# Patient Record
Sex: Male | Born: 1945
Health system: Southern US, Community
[De-identification: ages and names within clinical notes are randomized; demographics above are authoritative.]

## PROBLEM LIST (undated history)

## (undated) DIAGNOSIS — C159 Malignant neoplasm of esophagus, unspecified: Secondary | ICD-10-CM

## (undated) DIAGNOSIS — K219 Gastro-esophageal reflux disease without esophagitis: Secondary | ICD-10-CM

## (undated) DIAGNOSIS — R490 Dysphonia: Secondary | ICD-10-CM

## (undated) DIAGNOSIS — R109 Unspecified abdominal pain: Secondary | ICD-10-CM

## (undated) DIAGNOSIS — Z974 Presence of external hearing-aid: Secondary | ICD-10-CM

## (undated) DIAGNOSIS — I499 Cardiac arrhythmia, unspecified: Secondary | ICD-10-CM

## (undated) HISTORY — PX: PROSTATE BIOPSY: SHX241

## (undated) HISTORY — DX: Malignant neoplasm of esophagus, unspecified: C15.9

---

## 2009-02-03 ENCOUNTER — Emergency Department (HOSPITAL_COMMUNITY): Admission: EM | Admit: 2009-02-03 | Discharge: 2009-02-03 | Payer: Self-pay | Admitting: Emergency Medicine

## 2010-06-11 ENCOUNTER — Ambulatory Visit: Admission: RE | Admit: 2010-06-11 | Discharge: 2010-06-19 | Payer: Self-pay | Admitting: Urology

## 2010-06-18 ENCOUNTER — Ambulatory Visit (HOSPITAL_COMMUNITY): Admission: RE | Admit: 2010-06-18 | Discharge: 2010-06-18 | Payer: Self-pay | Admitting: Gastroenterology

## 2010-06-23 ENCOUNTER — Ambulatory Visit: Payer: Self-pay | Admitting: Oncology

## 2010-06-24 ENCOUNTER — Ambulatory Visit (HOSPITAL_COMMUNITY): Admission: RE | Admit: 2010-06-24 | Discharge: 2010-06-24 | Payer: Self-pay | Admitting: Gastroenterology

## 2010-06-30 ENCOUNTER — Ambulatory Visit: Admission: RE | Admit: 2010-06-30 | Discharge: 2010-08-18 | Payer: Self-pay | Admitting: Radiation Oncology

## 2010-07-08 LAB — CBC WITH DIFFERENTIAL/PLATELET
BASO%: 0.3 % (ref 0.0–2.0)
Basophils Absolute: 0 10*3/uL (ref 0.0–0.1)
EOS%: 0.2 % (ref 0.0–7.0)
Eosinophils Absolute: 0 10*3/uL (ref 0.0–0.5)
HCT: 46.3 % (ref 38.4–49.9)
HGB: 16 g/dL (ref 13.0–17.1)
LYMPH%: 7 % — ABNORMAL LOW (ref 14.0–49.0)
MCH: 31.9 pg (ref 27.2–33.4)
MCHC: 34.6 g/dL (ref 32.0–36.0)
MCV: 92.4 fL (ref 79.3–98.0)
MONO#: 0.2 10*3/uL (ref 0.1–0.9)
MONO%: 2.4 % (ref 0.0–14.0)
NEUT#: 7.7 10*3/uL — ABNORMAL HIGH (ref 1.5–6.5)
NEUT%: 90.1 % — ABNORMAL HIGH (ref 39.0–75.0)
Platelets: 266 10*3/uL (ref 140–400)
RBC: 5.01 10*6/uL (ref 4.20–5.82)
RDW: 13.8 % (ref 11.0–14.6)
WBC: 8.6 10*3/uL (ref 4.0–10.3)
lymph#: 0.6 10*3/uL — ABNORMAL LOW (ref 0.9–3.3)

## 2010-07-08 LAB — COMPREHENSIVE METABOLIC PANEL
ALT: 33 U/L (ref 0–53)
AST: 26 U/L (ref 0–37)
Albumin: 4 g/dL (ref 3.5–5.2)
Alkaline Phosphatase: 57 U/L (ref 39–117)
BUN: 13 mg/dL (ref 6–23)
CO2: 32 mEq/L (ref 19–32)
Calcium: 9.4 mg/dL (ref 8.4–10.5)
Chloride: 104 mEq/L (ref 96–112)
Creatinine, Ser: 1.37 mg/dL (ref 0.40–1.50)
Glucose, Bld: 96 mg/dL (ref 70–99)
Potassium: 4.3 mEq/L (ref 3.5–5.3)
Sodium: 140 mEq/L (ref 135–145)
Total Bilirubin: 0.9 mg/dL (ref 0.3–1.2)
Total Protein: 7 g/dL (ref 6.0–8.3)

## 2010-07-14 ENCOUNTER — Ambulatory Visit: Payer: Self-pay | Admitting: Thoracic Surgery

## 2010-07-15 LAB — CBC WITH DIFFERENTIAL/PLATELET
BASO%: 0.6 % (ref 0.0–2.0)
Basophils Absolute: 0 10*3/uL (ref 0.0–0.1)
EOS%: 3.3 % (ref 0.0–7.0)
Eosinophils Absolute: 0.2 10*3/uL (ref 0.0–0.5)
HCT: 44 % (ref 38.4–49.9)
HGB: 15.3 g/dL (ref 13.0–17.1)
LYMPH%: 17.7 % (ref 14.0–49.0)
MCH: 31.7 pg (ref 27.2–33.4)
MCHC: 34.8 g/dL (ref 32.0–36.0)
MCV: 91.3 fL (ref 79.3–98.0)
MONO#: 0.5 10*3/uL (ref 0.1–0.9)
MONO%: 9.5 % (ref 0.0–14.0)
NEUT#: 3.6 10*3/uL (ref 1.5–6.5)
NEUT%: 68.9 % (ref 39.0–75.0)
Platelets: 248 10*3/uL (ref 140–400)
RBC: 4.82 10*6/uL (ref 4.20–5.82)
RDW: 13.4 % (ref 11.0–14.6)
WBC: 5.2 10*3/uL (ref 4.0–10.3)
lymph#: 0.9 10*3/uL (ref 0.9–3.3)
nRBC: 0 % (ref 0–0)

## 2010-07-22 LAB — CBC WITH DIFFERENTIAL/PLATELET
BASO%: 0.8 % (ref 0.0–2.0)
Basophils Absolute: 0 10*3/uL (ref 0.0–0.1)
EOS%: 4.8 % (ref 0.0–7.0)
Eosinophils Absolute: 0.2 10*3/uL (ref 0.0–0.5)
HCT: 42.8 % (ref 38.4–49.9)
HGB: 14.8 g/dL (ref 13.0–17.1)
LYMPH%: 17.6 % (ref 14.0–49.0)
MCH: 31.6 pg (ref 27.2–33.4)
MCHC: 34.6 g/dL (ref 32.0–36.0)
MCV: 91.5 fL (ref 79.3–98.0)
MONO#: 0.4 10*3/uL (ref 0.1–0.9)
MONO%: 9.6 % (ref 0.0–14.0)
NEUT#: 2.5 10*3/uL (ref 1.5–6.5)
NEUT%: 67.2 % (ref 39.0–75.0)
Platelets: 194 10*3/uL (ref 140–400)
RBC: 4.68 10*6/uL (ref 4.20–5.82)
RDW: 13.9 % (ref 11.0–14.6)
WBC: 3.8 10*3/uL — ABNORMAL LOW (ref 4.0–10.3)
lymph#: 0.7 10*3/uL — ABNORMAL LOW (ref 0.9–3.3)

## 2010-07-22 LAB — COMPREHENSIVE METABOLIC PANEL
ALT: 23 U/L (ref 0–53)
AST: 25 U/L (ref 0–37)
Albumin: 3.8 g/dL (ref 3.5–5.2)
Alkaline Phosphatase: 51 U/L (ref 39–117)
BUN: 14 mg/dL (ref 6–23)
CO2: 30 mEq/L (ref 19–32)
Calcium: 9.5 mg/dL (ref 8.4–10.5)
Chloride: 103 mEq/L (ref 96–112)
Creatinine, Ser: 0.73 mg/dL (ref 0.40–1.50)
Glucose, Bld: 91 mg/dL (ref 70–99)
Potassium: 4.1 mEq/L (ref 3.5–5.3)
Sodium: 141 mEq/L (ref 135–145)
Total Bilirubin: 0.9 mg/dL (ref 0.3–1.2)
Total Protein: 6.5 g/dL (ref 6.0–8.3)

## 2010-07-25 ENCOUNTER — Ambulatory Visit: Payer: Self-pay | Admitting: Oncology

## 2010-07-29 LAB — CBC WITH DIFFERENTIAL/PLATELET
BASO%: 1.5 % (ref 0.0–2.0)
Basophils Absolute: 0.1 10*3/uL (ref 0.0–0.1)
EOS%: 4 % (ref 0.0–7.0)
Eosinophils Absolute: 0.2 10*3/uL (ref 0.0–0.5)
HCT: 42.3 % (ref 38.4–49.9)
HGB: 14.8 g/dL (ref 13.0–17.1)
LYMPH%: 11.5 % — ABNORMAL LOW (ref 14.0–49.0)
MCH: 31.9 pg (ref 27.2–33.4)
MCHC: 35 g/dL (ref 32.0–36.0)
MCV: 91.2 fL (ref 79.3–98.0)
MONO#: 0.5 10*3/uL (ref 0.1–0.9)
MONO%: 11.5 % (ref 0.0–14.0)
NEUT#: 2.9 10*3/uL (ref 1.5–6.5)
NEUT%: 71.5 % (ref 39.0–75.0)
Platelets: 151 10*3/uL (ref 140–400)
RBC: 4.64 10*6/uL (ref 4.20–5.82)
RDW: 14.3 % (ref 11.0–14.6)
WBC: 4 10*3/uL (ref 4.0–10.3)
lymph#: 0.5 10*3/uL — ABNORMAL LOW (ref 0.9–3.3)
nRBC: 0 % (ref 0–0)

## 2010-08-05 LAB — CBC WITH DIFFERENTIAL/PLATELET
BASO%: 0.7 % (ref 0.0–2.0)
Basophils Absolute: 0 10*3/uL (ref 0.0–0.1)
EOS%: 2.9 % (ref 0.0–7.0)
Eosinophils Absolute: 0.1 10*3/uL (ref 0.0–0.5)
HCT: 40.5 % (ref 38.4–49.9)
HGB: 13.9 g/dL (ref 13.0–17.1)
LYMPH%: 9.3 % — ABNORMAL LOW (ref 14.0–49.0)
MCH: 31.6 pg (ref 27.2–33.4)
MCHC: 34.3 g/dL (ref 32.0–36.0)
MCV: 92 fL (ref 79.3–98.0)
MONO#: 0.5 10*3/uL (ref 0.1–0.9)
MONO%: 12 % (ref 0.0–14.0)
NEUT#: 3.1 10*3/uL (ref 1.5–6.5)
NEUT%: 75.1 % — ABNORMAL HIGH (ref 39.0–75.0)
Platelets: 144 10*3/uL (ref 140–400)
RBC: 4.4 10*6/uL (ref 4.20–5.82)
RDW: 14.8 % — ABNORMAL HIGH (ref 11.0–14.6)
WBC: 4.1 10*3/uL (ref 4.0–10.3)
lymph#: 0.4 10*3/uL — ABNORMAL LOW (ref 0.9–3.3)

## 2010-08-05 LAB — COMPREHENSIVE METABOLIC PANEL
ALT: 36 U/L (ref 0–53)
AST: 32 U/L (ref 0–37)
Albumin: 3.9 g/dL (ref 3.5–5.2)
Alkaline Phosphatase: 45 U/L (ref 39–117)
BUN: 18 mg/dL (ref 6–23)
CO2: 28 mEq/L (ref 19–32)
Calcium: 9.3 mg/dL (ref 8.4–10.5)
Chloride: 103 mEq/L (ref 96–112)
Creatinine, Ser: 0.9 mg/dL (ref 0.40–1.50)
Glucose, Bld: 85 mg/dL (ref 70–99)
Potassium: 4.1 mEq/L (ref 3.5–5.3)
Sodium: 140 mEq/L (ref 135–145)
Total Bilirubin: 0.5 mg/dL (ref 0.3–1.2)
Total Protein: 6.9 g/dL (ref 6.0–8.3)

## 2010-08-12 ENCOUNTER — Ambulatory Visit: Payer: Self-pay | Admitting: Thoracic Surgery

## 2010-08-12 LAB — COMPREHENSIVE METABOLIC PANEL
ALT: 26 U/L (ref 0–53)
AST: 24 U/L (ref 0–37)
Albumin: 4.1 g/dL (ref 3.5–5.2)
Alkaline Phosphatase: 52 U/L (ref 39–117)
BUN: 16 mg/dL (ref 6–23)
CO2: 28 mEq/L (ref 19–32)
Calcium: 9.4 mg/dL (ref 8.4–10.5)
Chloride: 103 mEq/L (ref 96–112)
Creatinine, Ser: 0.88 mg/dL (ref 0.40–1.50)
Glucose, Bld: 97 mg/dL (ref 70–99)
Potassium: 4.1 mEq/L (ref 3.5–5.3)
Sodium: 141 mEq/L (ref 135–145)
Total Bilirubin: 0.6 mg/dL (ref 0.3–1.2)
Total Protein: 6.1 g/dL (ref 6.0–8.3)

## 2010-08-12 LAB — CBC WITH DIFFERENTIAL/PLATELET
BASO%: 1 % (ref 0.0–2.0)
Basophils Absolute: 0 10*3/uL (ref 0.0–0.1)
EOS%: 3.8 % (ref 0.0–7.0)
Eosinophils Absolute: 0.1 10*3/uL (ref 0.0–0.5)
HCT: 39.8 % (ref 38.4–49.9)
HGB: 14 g/dL (ref 13.0–17.1)
LYMPH%: 7.6 % — ABNORMAL LOW (ref 14.0–49.0)
MCH: 31.9 pg (ref 27.2–33.4)
MCHC: 35.2 g/dL (ref 32.0–36.0)
MCV: 90.7 fL (ref 79.3–98.0)
MONO#: 0.4 10*3/uL (ref 0.1–0.9)
MONO%: 13.7 % (ref 0.0–14.0)
NEUT#: 2.3 10*3/uL (ref 1.5–6.5)
NEUT%: 73.9 % (ref 39.0–75.0)
Platelets: 143 10*3/uL (ref 140–400)
RBC: 4.39 10*6/uL (ref 4.20–5.82)
RDW: 15.4 % — ABNORMAL HIGH (ref 11.0–14.6)
WBC: 3.1 10*3/uL — ABNORMAL LOW (ref 4.0–10.3)
lymph#: 0.2 10*3/uL — ABNORMAL LOW (ref 0.9–3.3)
nRBC: 0 % (ref 0–0)

## 2010-08-19 LAB — COMPREHENSIVE METABOLIC PANEL
ALT: 27 U/L (ref 0–53)
AST: 28 U/L (ref 0–37)
Albumin: 3.4 g/dL — ABNORMAL LOW (ref 3.5–5.2)
Alkaline Phosphatase: 66 U/L (ref 39–117)
BUN: 15 mg/dL (ref 6–23)
CO2: 27 mEq/L (ref 19–32)
Calcium: 8.7 mg/dL (ref 8.4–10.5)
Chloride: 106 mEq/L (ref 96–112)
Creatinine, Ser: 0.9 mg/dL (ref 0.40–1.50)
Glucose, Bld: 116 mg/dL — ABNORMAL HIGH (ref 70–99)
Potassium: 4.5 mEq/L (ref 3.5–5.3)
Sodium: 139 mEq/L (ref 135–145)
Total Bilirubin: 0.5 mg/dL (ref 0.3–1.2)
Total Protein: 6.1 g/dL (ref 6.0–8.3)

## 2010-08-19 LAB — CBC WITH DIFFERENTIAL/PLATELET
BASO%: 1 % (ref 0.0–2.0)
Basophils Absolute: 0 10*3/uL (ref 0.0–0.1)
EOS%: 2.1 % (ref 0.0–7.0)
Eosinophils Absolute: 0.1 10*3/uL (ref 0.0–0.5)
HCT: 35.6 % — ABNORMAL LOW (ref 38.4–49.9)
HGB: 12.6 g/dL — ABNORMAL LOW (ref 13.0–17.1)
LYMPH%: 5.2 % — ABNORMAL LOW (ref 14.0–49.0)
MCH: 33.5 pg — ABNORMAL HIGH (ref 27.2–33.4)
MCHC: 35.5 g/dL (ref 32.0–36.0)
MCV: 94.4 fL (ref 79.3–98.0)
MONO#: 0.6 10*3/uL (ref 0.1–0.9)
MONO%: 17.4 % — ABNORMAL HIGH (ref 0.0–14.0)
NEUT#: 2.7 10*3/uL (ref 1.5–6.5)
NEUT%: 74.3 % (ref 39.0–75.0)
Platelets: 185 10*3/uL (ref 140–400)
RBC: 3.77 10*6/uL — ABNORMAL LOW (ref 4.20–5.82)
RDW: 16.8 % — ABNORMAL HIGH (ref 11.0–14.6)
WBC: 3.7 10*3/uL — ABNORMAL LOW (ref 4.0–10.3)
lymph#: 0.2 10*3/uL — ABNORMAL LOW (ref 0.9–3.3)

## 2010-09-03 ENCOUNTER — Ambulatory Visit (HOSPITAL_COMMUNITY)
Admission: RE | Admit: 2010-09-03 | Discharge: 2010-09-03 | Payer: Self-pay | Source: Home / Self Care | Attending: Thoracic Surgery | Admitting: Thoracic Surgery

## 2010-09-03 ENCOUNTER — Ambulatory Visit: Payer: Self-pay | Admitting: Thoracic Surgery

## 2010-09-10 ENCOUNTER — Ambulatory Visit: Payer: Self-pay | Admitting: Oncology

## 2010-09-28 ENCOUNTER — Inpatient Hospital Stay (HOSPITAL_COMMUNITY)
Admission: EM | Admit: 2010-09-28 | Discharge: 2010-10-19 | Payer: Self-pay | Source: Home / Self Care | Attending: Thoracic Surgery | Admitting: Thoracic Surgery

## 2010-09-29 ENCOUNTER — Encounter: Payer: Self-pay | Admitting: Thoracic Surgery

## 2010-09-29 HISTORY — PX: JEJUNOSTOMY: SHX313

## 2010-09-29 HISTORY — PX: PYLOROPLASTY: SHX418

## 2010-10-02 HISTORY — PX: ESOPHAGECTOMY: SUR457

## 2010-10-06 LAB — COMPREHENSIVE METABOLIC PANEL
ALT: 28 U/L (ref 0–53)
ALT: 32 U/L (ref 0–53)
ALT: 64 U/L — ABNORMAL HIGH (ref 0–53)
ALT: 139 U/L — ABNORMAL HIGH (ref 0–53)
ALT: 167 U/L — ABNORMAL HIGH (ref 0–53)
AST: 37 U/L (ref 0–37)
AST: 50 U/L — ABNORMAL HIGH (ref 0–37)
AST: 81 U/L — ABNORMAL HIGH (ref 0–37)
AST: 117 U/L — ABNORMAL HIGH (ref 0–37)
AST: 125 U/L — ABNORMAL HIGH (ref 0–37)
Albumin: 3.5 g/dL (ref 3.5–5.2)
Albumin: 2.9 g/dL — ABNORMAL LOW (ref 3.5–5.2)
Albumin: 2.7 g/dL — ABNORMAL LOW (ref 3.5–5.2)
Albumin: 2.4 g/dL — ABNORMAL LOW (ref 3.5–5.2)
Albumin: 2.6 g/dL — ABNORMAL LOW (ref 3.5–5.2)
Alkaline Phosphatase: 116 U/L (ref 39–117)
Alkaline Phosphatase: 66 U/L (ref 39–117)
Alkaline Phosphatase: 178 U/L — ABNORMAL HIGH (ref 39–117)
Alkaline Phosphatase: 255 U/L — ABNORMAL HIGH (ref 39–117)
Alkaline Phosphatase: 278 U/L — ABNORMAL HIGH (ref 39–117)
BUN: 11 mg/dL (ref 6–23)
BUN: 16 mg/dL (ref 6–23)
BUN: 11 mg/dL (ref 6–23)
BUN: 14 mg/dL (ref 6–23)
BUN: 14 mg/dL (ref 6–23)
CO2: 26 mEq/L (ref 19–32)
CO2: 26 mEq/L (ref 19–32)
CO2: 29 mEq/L (ref 19–32)
CO2: 30 mEq/L (ref 19–32)
CO2: 29 mEq/L (ref 19–32)
Calcium: 9.3 mg/dL (ref 8.4–10.5)
Calcium: 8.7 mg/dL (ref 8.4–10.5)
Calcium: 8.4 mg/dL (ref 8.4–10.5)
Calcium: 8.6 mg/dL (ref 8.4–10.5)
Calcium: 8.6 mg/dL (ref 8.4–10.5)
Chloride: 105 mEq/L (ref 96–112)
Chloride: 106 mEq/L (ref 96–112)
Chloride: 101 mEq/L (ref 96–112)
Chloride: 104 mEq/L (ref 96–112)
Chloride: 104 mEq/L (ref 96–112)
Creatinine, Ser: 0.9 mg/dL (ref 0.4–1.5)
Creatinine, Ser: 1.07 mg/dL (ref 0.4–1.5)
Creatinine, Ser: 0.9 mg/dL (ref 0.4–1.5)
Creatinine, Ser: 0.82 mg/dL (ref 0.4–1.5)
Creatinine, Ser: 0.87 mg/dL (ref 0.4–1.5)
GFR calc Af Amer: >60 mL/min (ref >60)
GFR calc Af Amer: >60 mL/min (ref >60)
GFR calc Af Amer: >60 mL/min (ref >60)
GFR calc Af Amer: >60 mL/min (ref >60)
GFR calc Af Amer: >60 mL/min (ref >60)
GFR calc non Af Amer: >60 mL/min (ref >60)
GFR calc non Af Amer: >60 mL/min (ref >60)
GFR calc non Af Amer: >60 mL/min (ref >60)
GFR calc non Af Amer: >60 mL/min (ref >60)
GFR calc non Af Amer: >60 mL/min (ref >60)
Glucose, Bld: 120 mg/dL — ABNORMAL HIGH (ref 70–99)
Glucose, Bld: 125 mg/dL — ABNORMAL HIGH (ref 70–99)
Glucose, Bld: 112 mg/dL — ABNORMAL HIGH (ref 70–99)
Glucose, Bld: 115 mg/dL — ABNORMAL HIGH (ref 70–99)
Glucose, Bld: 135 mg/dL — ABNORMAL HIGH (ref 70–99)
Potassium: 4.1 mEq/L (ref 3.5–5.1)
Potassium: 4.2 mEq/L (ref 3.5–5.1)
Potassium: 3.5 mEq/L (ref 3.5–5.1)
Potassium: 3.5 mEq/L (ref 3.5–5.1)
Potassium: 3.7 mEq/L (ref 3.5–5.1)
Sodium: 138 mEq/L (ref 135–145)
Sodium: 138 mEq/L (ref 135–145)
Sodium: 136 mEq/L (ref 135–145)
Sodium: 142 mEq/L (ref 135–145)
Sodium: 141 mEq/L (ref 135–145)
Total Bilirubin: 0.8 mg/dL (ref 0.3–1.2)
Total Bilirubin: 2 mg/dL — ABNORMAL HIGH (ref 0.3–1.2)
Total Bilirubin: 2 mg/dL — ABNORMAL HIGH (ref 0.3–1.2)
Total Bilirubin: 1.3 mg/dL — ABNORMAL HIGH (ref 0.3–1.2)
Total Bilirubin: 1.5 mg/dL — ABNORMAL HIGH (ref 0.3–1.2)
Total Protein: 6.7 g/dL (ref 6.0–8.3)
Total Protein: 5.5 g/dL — ABNORMAL LOW (ref 6.0–8.3)
Total Protein: 5.4 g/dL — ABNORMAL LOW (ref 6.0–8.3)
Total Protein: 5.3 g/dL — ABNORMAL LOW (ref 6.0–8.3)
Total Protein: 5.7 g/dL — ABNORMAL LOW (ref 6.0–8.3)

## 2010-10-06 LAB — BASIC METABOLIC PANEL
BUN: 11 mg/dL (ref 6–23)
BUN: 9 mg/dL (ref 6–23)
BUN: 12 mg/dL (ref 6–23)
CO2: 24 mEq/L (ref 19–32)
CO2: 26 mEq/L (ref 19–32)
CO2: 31 mEq/L (ref 19–32)
Calcium: 8.4 mg/dL (ref 8.4–10.5)
Calcium: 8.3 mg/dL — ABNORMAL LOW (ref 8.4–10.5)
Calcium: 8.8 mg/dL (ref 8.4–10.5)
Chloride: 106 mEq/L (ref 96–112)
Chloride: 106 mEq/L (ref 96–112)
Chloride: 103 mEq/L (ref 96–112)
Creatinine, Ser: 0.89 mg/dL (ref 0.4–1.5)
Creatinine, Ser: 0.85 mg/dL (ref 0.4–1.5)
Creatinine, Ser: 0.88 mg/dL (ref 0.4–1.5)
GFR calc Af Amer: >60 mL/min (ref >60)
GFR calc Af Amer: >60 mL/min (ref >60)
GFR calc Af Amer: >60 mL/min (ref >60)
GFR calc non Af Amer: >60 mL/min (ref >60)
GFR calc non Af Amer: >60 mL/min (ref >60)
GFR calc non Af Amer: >60 mL/min (ref >60)
Glucose, Bld: 186 mg/dL — ABNORMAL HIGH (ref 70–99)
Glucose, Bld: 141 mg/dL — ABNORMAL HIGH (ref 70–99)
Glucose, Bld: 127 mg/dL — ABNORMAL HIGH (ref 70–99)
Potassium: 3.9 mEq/L (ref 3.5–5.1)
Potassium: 3.9 mEq/L (ref 3.5–5.1)
Potassium: 3.4 mEq/L — ABNORMAL LOW (ref 3.5–5.1)
Sodium: 136 mEq/L (ref 135–145)
Sodium: 138 mEq/L (ref 135–145)
Sodium: 143 mEq/L (ref 135–145)

## 2010-10-06 LAB — MRSA PCR SCREENING
MRSA by PCR: NEGATIVE
MRSA by PCR: NEGATIVE

## 2010-10-06 LAB — CROSSMATCH
ABO/RH(D): B POS
Antibody Screen: NEGATIVE
Unit division: 0
Unit division: 0

## 2010-10-06 LAB — CBC
HCT: 42.3 % (ref 39.0–52.0)
HCT: 39.6 % (ref 39.0–52.0)
HCT: 38 % — ABNORMAL LOW (ref 39.0–52.0)
HCT: 32 % — ABNORMAL LOW (ref 39.0–52.0)
HCT: 29.9 % — ABNORMAL LOW (ref 39.0–52.0)
HCT: 32 % — ABNORMAL LOW (ref 39.0–52.0)
HCT: 31.2 % — ABNORMAL LOW (ref 39.0–52.0)
HCT: 32.5 % — ABNORMAL LOW (ref 39.0–52.0)
Hemoglobin: 14.3 g/dL (ref 13.0–17.0)
Hemoglobin: 13.2 g/dL (ref 13.0–17.0)
Hemoglobin: 12.4 g/dL — ABNORMAL LOW (ref 13.0–17.0)
Hemoglobin: 10.7 g/dL — ABNORMAL LOW (ref 13.0–17.0)
Hemoglobin: 9.8 g/dL — ABNORMAL LOW (ref 13.0–17.0)
Hemoglobin: 10.5 g/dL — ABNORMAL LOW (ref 13.0–17.0)
Hemoglobin: 10.3 g/dL — ABNORMAL LOW (ref 13.0–17.0)
Hemoglobin: 10.6 g/dL — ABNORMAL LOW (ref 13.0–17.0)
MCH: 31.5 pg (ref 26.0–34.0)
MCH: 31.1 pg (ref 26.0–34.0)
MCH: 30.6 pg (ref 26.0–34.0)
MCH: 31.8 pg (ref 26.0–34.0)
MCH: 31 pg (ref 26.0–34.0)
MCH: 30.9 pg (ref 26.0–34.0)
MCH: 31.2 pg (ref 26.0–34.0)
MCH: 30.9 pg (ref 26.0–34.0)
MCHC: 33.8 g/dL (ref 30.0–36.0)
MCHC: 33.3 g/dL (ref 30.0–36.0)
MCHC: 32.6 g/dL (ref 30.0–36.0)
MCHC: 33.4 g/dL (ref 30.0–36.0)
MCHC: 32.8 g/dL (ref 30.0–36.0)
MCHC: 32.8 g/dL (ref 30.0–36.0)
MCHC: 33 g/dL (ref 30.0–36.0)
MCHC: 32.6 g/dL (ref 30.0–36.0)
MCV: 93.2 fL (ref 78.0–100.0)
MCV: 93.4 fL (ref 78.0–100.0)
MCV: 93.8 fL (ref 78.0–100.0)
MCV: 95 fL (ref 78.0–100.0)
MCV: 94.6 fL (ref 78.0–100.0)
MCV: 94.1 fL (ref 78.0–100.0)
MCV: 94.5 fL (ref 78.0–100.0)
MCV: 94.8 fL (ref 78.0–100.0)
Platelets: 229 10*3/uL (ref 150–400)
Platelets: 206 10*3/uL (ref 150–400)
Platelets: 205 10*3/uL (ref 150–400)
Platelets: 146 10*3/uL — ABNORMAL LOW (ref 150–400)
Platelets: 140 10*3/uL — ABNORMAL LOW (ref 150–400)
Platelets: 182 10*3/uL (ref 150–400)
Platelets: 199 10*3/uL (ref 150–400)
Platelets: 220 10*3/uL (ref 150–400)
RBC: 4.54 MIL/uL (ref 4.22–5.81)
RBC: 4.24 MIL/uL (ref 4.22–5.81)
RBC: 4.05 MIL/uL — ABNORMAL LOW (ref 4.22–5.81)
RBC: 3.37 MIL/uL — ABNORMAL LOW (ref 4.22–5.81)
RBC: 3.16 MIL/uL — ABNORMAL LOW (ref 4.22–5.81)
RBC: 3.4 MIL/uL — ABNORMAL LOW (ref 4.22–5.81)
RBC: 3.3 MIL/uL — ABNORMAL LOW (ref 4.22–5.81)
RBC: 3.43 MIL/uL — ABNORMAL LOW (ref 4.22–5.81)
RDW: 16.2 % — ABNORMAL HIGH (ref 11.5–15.5)
RDW: 16.4 % — ABNORMAL HIGH (ref 11.5–15.5)
RDW: 16.3 % — ABNORMAL HIGH (ref 11.5–15.5)
RDW: 16.5 % — ABNORMAL HIGH (ref 11.5–15.5)
RDW: 16.2 % — ABNORMAL HIGH (ref 11.5–15.5)
RDW: 16.2 % — ABNORMAL HIGH (ref 11.5–15.5)
RDW: 16.3 % — ABNORMAL HIGH (ref 11.5–15.5)
RDW: 16.3 % — ABNORMAL HIGH (ref 11.5–15.5)
WBC: 5.7 10*3/uL (ref 4.0–10.5)
WBC: 9.2 10*3/uL (ref 4.0–10.5)
WBC: 9.4 10*3/uL (ref 4.0–10.5)
WBC: 7.8 10*3/uL (ref 4.0–10.5)
WBC: 5.1 10*3/uL (ref 4.0–10.5)
WBC: 4.6 10*3/uL (ref 4.0–10.5)
WBC: 4.7 10*3/uL (ref 4.0–10.5)
WBC: 6.2 10*3/uL (ref 4.0–10.5)

## 2010-10-06 LAB — GLUCOSE, CAPILLARY
Glucose-Capillary: 118 mg/dL — ABNORMAL HIGH (ref 70–99)
Glucose-Capillary: 125 mg/dL — ABNORMAL HIGH (ref 70–99)
Glucose-Capillary: 126 mg/dL — ABNORMAL HIGH (ref 70–99)
Glucose-Capillary: 130 mg/dL — ABNORMAL HIGH (ref 70–99)
Glucose-Capillary: 155 mg/dL — ABNORMAL HIGH (ref 70–99)
Glucose-Capillary: 172 mg/dL — ABNORMAL HIGH (ref 70–99)
Glucose-Capillary: 129 mg/dL — ABNORMAL HIGH (ref 70–99)
Glucose-Capillary: 130 mg/dL — ABNORMAL HIGH (ref 70–99)
Glucose-Capillary: 138 mg/dL — ABNORMAL HIGH (ref 70–99)
Glucose-Capillary: 141 mg/dL — ABNORMAL HIGH (ref 70–99)
Glucose-Capillary: 150 mg/dL — ABNORMAL HIGH (ref 70–99)
Glucose-Capillary: 156 mg/dL — ABNORMAL HIGH (ref 70–99)
Glucose-Capillary: 160 mg/dL — ABNORMAL HIGH (ref 70–99)
Glucose-Capillary: 109 mg/dL — ABNORMAL HIGH (ref 70–99)
Glucose-Capillary: 113 mg/dL — ABNORMAL HIGH (ref 70–99)
Glucose-Capillary: 122 mg/dL — ABNORMAL HIGH (ref 70–99)
Glucose-Capillary: 122 mg/dL — ABNORMAL HIGH (ref 70–99)
Glucose-Capillary: 136 mg/dL — ABNORMAL HIGH (ref 70–99)
Glucose-Capillary: 143 mg/dL — ABNORMAL HIGH (ref 70–99)
Glucose-Capillary: 144 mg/dL — ABNORMAL HIGH (ref 70–99)
Glucose-Capillary: 148 mg/dL — ABNORMAL HIGH (ref 70–99)
Glucose-Capillary: 152 mg/dL — ABNORMAL HIGH (ref 70–99)
Glucose-Capillary: 102 mg/dL — ABNORMAL HIGH (ref 70–99)
Glucose-Capillary: 143 mg/dL — ABNORMAL HIGH (ref 70–99)

## 2010-10-06 LAB — URINALYSIS, ROUTINE W REFLEX MICROSCOPIC
Bilirubin Urine: NEGATIVE
Hgb urine dipstick: NEGATIVE
Ketones, ur: NEGATIVE mg/dL
Nitrite: NEGATIVE
Protein, ur: NEGATIVE mg/dL
Specific Gravity, Urine: 1.007 (ref 1.005–1.030)
Urine Glucose, Fasting: NEGATIVE mg/dL
Urobilinogen, UA: 0.2 mg/dL (ref 0.0–1.0)
pH: 6 (ref 5.0–8.0)

## 2010-10-06 LAB — POCT I-STAT 3, ART BLOOD GAS (G3+)
Bicarbonate: 25.4 mEq/L — ABNORMAL HIGH (ref 20.0–24.0)
O2 Saturation: 95 %
Patient temperature: 98.6
TCO2: 27 mmol/L (ref 0–100)
pCO2 arterial: 45.2 mmHg — ABNORMAL HIGH (ref 35.0–45.0)
pH, Arterial: 7.357 (ref 7.350–7.450)
pO2, Arterial: 77 mmHg — ABNORMAL LOW (ref 80.0–100.0)

## 2010-10-06 LAB — PROTIME-INR
INR: 0.89 (ref 0.00–1.49)
Prothrombin Time: 12.2 seconds (ref 11.6–15.2)

## 2010-10-06 LAB — NA AND K (SODIUM & POTASSIUM), RAND UR
Potassium Urine: 172 mEq/L
Sodium, Ur: <10 mEq/L

## 2010-10-06 LAB — AMYLASE: Amylase: 38 U/L (ref 0–105)

## 2010-10-06 LAB — ABO/RH: ABO/RH(D): B POS

## 2010-10-06 LAB — APTT: aPTT: 28 seconds (ref 24–37)

## 2010-10-08 LAB — COMPREHENSIVE METABOLIC PANEL
ALT: 161 U/L — ABNORMAL HIGH (ref 0–53)
ALT: 132 U/L — ABNORMAL HIGH (ref 0–53)
AST: 107 U/L — ABNORMAL HIGH (ref 0–37)
AST: 72 U/L — ABNORMAL HIGH (ref 0–37)
Albumin: 2.6 g/dL — ABNORMAL LOW (ref 3.5–5.2)
Albumin: 2.5 g/dL — ABNORMAL LOW (ref 3.5–5.2)
Alkaline Phosphatase: 296 U/L — ABNORMAL HIGH (ref 39–117)
Alkaline Phosphatase: 276 U/L — ABNORMAL HIGH (ref 39–117)
BUN: 15 mg/dL (ref 6–23)
BUN: 17 mg/dL (ref 6–23)
CO2: 30 mEq/L (ref 19–32)
CO2: 29 mEq/L (ref 19–32)
Calcium: 8.8 mg/dL (ref 8.4–10.5)
Calcium: 8.9 mg/dL (ref 8.4–10.5)
Chloride: 98 mEq/L (ref 96–112)
Chloride: 101 mEq/L (ref 96–112)
Creatinine, Ser: 0.84 mg/dL (ref 0.4–1.5)
Creatinine, Ser: 0.81 mg/dL (ref 0.4–1.5)
GFR calc Af Amer: >60 mL/min (ref >60)
GFR calc Af Amer: >60 mL/min (ref >60)
GFR calc non Af Amer: >60 mL/min (ref >60)
GFR calc non Af Amer: >60 mL/min (ref >60)
Glucose, Bld: 152 mg/dL — ABNORMAL HIGH (ref 70–99)
Glucose, Bld: 130 mg/dL — ABNORMAL HIGH (ref 70–99)
Potassium: 4.2 mEq/L (ref 3.5–5.1)
Potassium: 3.8 mEq/L (ref 3.5–5.1)
Sodium: 134 mEq/L — ABNORMAL LOW (ref 135–145)
Sodium: 139 mEq/L (ref 135–145)
Total Bilirubin: 1.7 mg/dL — ABNORMAL HIGH (ref 0.3–1.2)
Total Bilirubin: 1.2 mg/dL (ref 0.3–1.2)
Total Protein: 6.2 g/dL (ref 6.0–8.3)
Total Protein: 6 g/dL (ref 6.0–8.3)

## 2010-10-08 LAB — CBC
HCT: 34.6 % — ABNORMAL LOW (ref 39.0–52.0)
HCT: 30.9 % — ABNORMAL LOW (ref 39.0–52.0)
Hemoglobin: 11.1 g/dL — ABNORMAL LOW (ref 13.0–17.0)
Hemoglobin: 10.1 g/dL — ABNORMAL LOW (ref 13.0–17.0)
MCH: 30.7 pg (ref 26.0–34.0)
MCH: 30.9 pg (ref 26.0–34.0)
MCHC: 32.1 g/dL (ref 30.0–36.0)
MCHC: 32.7 g/dL (ref 30.0–36.0)
MCV: 95.6 fL (ref 78.0–100.0)
MCV: 94.5 fL (ref 78.0–100.0)
Platelets: 243 10*3/uL (ref 150–400)
Platelets: 261 10*3/uL (ref 150–400)
RBC: 3.62 MIL/uL — ABNORMAL LOW (ref 4.22–5.81)
RBC: 3.27 MIL/uL — ABNORMAL LOW (ref 4.22–5.81)
RDW: 16.3 % — ABNORMAL HIGH (ref 11.5–15.5)
RDW: 16.3 % — ABNORMAL HIGH (ref 11.5–15.5)
WBC: 5.9 10*3/uL (ref 4.0–10.5)
WBC: 7.1 10*3/uL (ref 4.0–10.5)

## 2010-10-08 LAB — GLUCOSE, CAPILLARY
Glucose-Capillary: 145 mg/dL — ABNORMAL HIGH (ref 70–99)
Glucose-Capillary: 165 mg/dL — ABNORMAL HIGH (ref 70–99)
Glucose-Capillary: 178 mg/dL — ABNORMAL HIGH (ref 70–99)
Glucose-Capillary: 124 mg/dL — ABNORMAL HIGH (ref 70–99)
Glucose-Capillary: 124 mg/dL — ABNORMAL HIGH (ref 70–99)
Glucose-Capillary: 146 mg/dL — ABNORMAL HIGH (ref 70–99)
Glucose-Capillary: 152 mg/dL — ABNORMAL HIGH (ref 70–99)
Glucose-Capillary: 159 mg/dL — ABNORMAL HIGH (ref 70–99)

## 2010-10-12 ENCOUNTER — Encounter: Payer: Self-pay | Admitting: Gastroenterology

## 2010-10-13 LAB — CBC
HCT: 32.5 % — ABNORMAL LOW (ref 39.0–52.0)
HCT: 30.3 % — ABNORMAL LOW (ref 39.0–52.0)
HCT: 29.8 % — ABNORMAL LOW (ref 39.0–52.0)
Hemoglobin: 10.5 g/dL — ABNORMAL LOW (ref 13.0–17.0)
Hemoglobin: 9.7 g/dL — ABNORMAL LOW (ref 13.0–17.0)
Hemoglobin: 9.5 g/dL — ABNORMAL LOW (ref 13.0–17.0)
MCH: 30.5 pg (ref 26.0–34.0)
MCH: 30.4 pg (ref 26.0–34.0)
MCH: 30.7 pg (ref 26.0–34.0)
MCHC: 32.3 g/dL (ref 30.0–36.0)
MCHC: 32 g/dL (ref 30.0–36.0)
MCHC: 31.9 g/dL (ref 30.0–36.0)
MCV: 94.5 fL (ref 78.0–100.0)
MCV: 95 fL (ref 78.0–100.0)
MCV: 96.4 fL (ref 78.0–100.0)
Platelets: 307 10*3/uL (ref 150–400)
Platelets: 282 10*3/uL (ref 150–400)
Platelets: 295 10*3/uL (ref 150–400)
RBC: 3.44 MIL/uL — ABNORMAL LOW (ref 4.22–5.81)
RBC: 3.19 MIL/uL — ABNORMAL LOW (ref 4.22–5.81)
RBC: 3.09 MIL/uL — ABNORMAL LOW (ref 4.22–5.81)
RDW: 16.2 % — ABNORMAL HIGH (ref 11.5–15.5)
RDW: 16.2 % — ABNORMAL HIGH (ref 11.5–15.5)
RDW: 16.5 % — ABNORMAL HIGH (ref 11.5–15.5)
WBC: 6.9 10*3/uL (ref 4.0–10.5)
WBC: 7.3 10*3/uL (ref 4.0–10.5)
WBC: 8.2 10*3/uL (ref 4.0–10.5)

## 2010-10-13 LAB — GLUCOSE, CAPILLARY
Glucose-Capillary: 113 mg/dL — ABNORMAL HIGH (ref 70–99)
Glucose-Capillary: 133 mg/dL — ABNORMAL HIGH (ref 70–99)
Glucose-Capillary: 138 mg/dL — ABNORMAL HIGH (ref 70–99)
Glucose-Capillary: 177 mg/dL — ABNORMAL HIGH (ref 70–99)
Glucose-Capillary: 130 mg/dL — ABNORMAL HIGH (ref 70–99)
Glucose-Capillary: 131 mg/dL — ABNORMAL HIGH (ref 70–99)
Glucose-Capillary: 140 mg/dL — ABNORMAL HIGH (ref 70–99)
Glucose-Capillary: 143 mg/dL — ABNORMAL HIGH (ref 70–99)
Glucose-Capillary: 152 mg/dL — ABNORMAL HIGH (ref 70–99)
Glucose-Capillary: 107 mg/dL — ABNORMAL HIGH (ref 70–99)
Glucose-Capillary: 114 mg/dL — ABNORMAL HIGH (ref 70–99)
Glucose-Capillary: 118 mg/dL — ABNORMAL HIGH (ref 70–99)

## 2010-10-13 LAB — BASIC METABOLIC PANEL
BUN: 16 mg/dL (ref 6–23)
BUN: 19 mg/dL (ref 6–23)
CO2: 30 mEq/L (ref 19–32)
CO2: 29 mEq/L (ref 19–32)
Calcium: 9.1 mg/dL (ref 8.4–10.5)
Calcium: 8.3 mg/dL — ABNORMAL LOW (ref 8.4–10.5)
Chloride: 98 mEq/L (ref 96–112)
Chloride: 105 mEq/L (ref 96–112)
Creatinine, Ser: 0.83 mg/dL (ref 0.4–1.5)
Creatinine, Ser: 0.88 mg/dL (ref 0.4–1.5)
GFR calc Af Amer: >60 mL/min (ref >60)
GFR calc Af Amer: >60 mL/min (ref >60)
GFR calc non Af Amer: >60 mL/min (ref >60)
GFR calc non Af Amer: >60 mL/min (ref >60)
Glucose, Bld: 117 mg/dL — ABNORMAL HIGH (ref 70–99)
Glucose, Bld: 142 mg/dL — ABNORMAL HIGH (ref 70–99)
Potassium: 4.1 mEq/L (ref 3.5–5.1)
Potassium: 4.1 mEq/L (ref 3.5–5.1)
Sodium: 138 mEq/L (ref 135–145)
Sodium: 139 mEq/L (ref 135–145)

## 2010-10-13 LAB — POCT I-STAT 3, ART BLOOD GAS (G3+)
Acid-Base Excess: 4 mmol/L — ABNORMAL HIGH (ref 0.0–2.0)
Acid-Base Excess: 4 mmol/L — ABNORMAL HIGH (ref 0.0–2.0)
Bicarbonate: 29.6 mEq/L — ABNORMAL HIGH (ref 20.0–24.0)
Bicarbonate: 29.3 mEq/L — ABNORMAL HIGH (ref 20.0–24.0)
O2 Saturation: 94 %
O2 Saturation: 95 %
Patient temperature: 98.6
Patient temperature: 98.6
TCO2: 31 mmol/L (ref 0–100)
TCO2: 31 mmol/L (ref 0–100)
pCO2 arterial: 47.5 mmHg — ABNORMAL HIGH (ref 35.0–45.0)
pCO2 arterial: 46.6 mmHg — ABNORMAL HIGH (ref 35.0–45.0)
pH, Arterial: 7.402 (ref 7.350–7.450)
pH, Arterial: 7.407 (ref 7.350–7.450)
pO2, Arterial: 72 mmHg — ABNORMAL LOW (ref 80.0–100.0)
pO2, Arterial: 76 mmHg — ABNORMAL LOW (ref 80.0–100.0)

## 2010-10-13 LAB — COMPREHENSIVE METABOLIC PANEL
ALT: 71 U/L — ABNORMAL HIGH (ref 0–53)
AST: 39 U/L — ABNORMAL HIGH (ref 0–37)
Albumin: 2.3 g/dL — ABNORMAL LOW (ref 3.5–5.2)
Alkaline Phosphatase: 238 U/L — ABNORMAL HIGH (ref 39–117)
BUN: 16 mg/dL (ref 6–23)
CO2: 29 mEq/L (ref 19–32)
Calcium: 8.4 mg/dL (ref 8.4–10.5)
Chloride: 100 mEq/L (ref 96–112)
Creatinine, Ser: 0.88 mg/dL (ref 0.4–1.5)
GFR calc Af Amer: >60 mL/min (ref >60)
GFR calc non Af Amer: >60 mL/min (ref >60)
Glucose, Bld: 147 mg/dL — ABNORMAL HIGH (ref 70–99)
Potassium: 4 mEq/L (ref 3.5–5.1)
Sodium: 136 mEq/L (ref 135–145)
Total Bilirubin: 1.1 mg/dL (ref 0.3–1.2)
Total Protein: 5.3 g/dL — ABNORMAL LOW (ref 6.0–8.3)

## 2010-10-13 LAB — WOUND CULTURE

## 2010-10-14 LAB — CBC
HCT: 28.4 % — ABNORMAL LOW (ref 39.0–52.0)
HCT: 28.4 % — ABNORMAL LOW (ref 39.0–52.0)
HCT: 28 % — ABNORMAL LOW (ref 39.0–52.0)
Hemoglobin: 9.3 g/dL — ABNORMAL LOW (ref 13.0–17.0)
Hemoglobin: 9.1 g/dL — ABNORMAL LOW (ref 13.0–17.0)
Hemoglobin: 9.4 g/dL — ABNORMAL LOW (ref 13.0–17.0)
MCH: 30.8 pg (ref 26.0–34.0)
MCH: 30.4 pg (ref 26.0–34.0)
MCH: 31.4 pg (ref 26.0–34.0)
MCHC: 32.7 g/dL (ref 30.0–36.0)
MCHC: 32 g/dL (ref 30.0–36.0)
MCHC: 33.6 g/dL (ref 30.0–36.0)
MCV: 94 fL (ref 78.0–100.0)
MCV: 95 fL (ref 78.0–100.0)
MCV: 93.6 fL (ref 78.0–100.0)
Platelets: 313 10*3/uL (ref 150–400)
Platelets: 332 10*3/uL (ref 150–400)
Platelets: 354 10*3/uL (ref 150–400)
RBC: 3.02 MIL/uL — ABNORMAL LOW (ref 4.22–5.81)
RBC: 2.99 MIL/uL — ABNORMAL LOW (ref 4.22–5.81)
RBC: 2.99 MIL/uL — ABNORMAL LOW (ref 4.22–5.81)
RDW: 16.1 % — ABNORMAL HIGH (ref 11.5–15.5)
RDW: 16 % — ABNORMAL HIGH (ref 11.5–15.5)
RDW: 15.9 % — ABNORMAL HIGH (ref 11.5–15.5)
WBC: 7.6 10*3/uL (ref 4.0–10.5)
WBC: 6.2 10*3/uL (ref 4.0–10.5)
WBC: 5.9 10*3/uL (ref 4.0–10.5)

## 2010-10-14 LAB — GLUCOSE, CAPILLARY
Glucose-Capillary: 101 mg/dL — ABNORMAL HIGH (ref 70–99)
Glucose-Capillary: 122 mg/dL — ABNORMAL HIGH (ref 70–99)
Glucose-Capillary: 130 mg/dL — ABNORMAL HIGH (ref 70–99)
Glucose-Capillary: 143 mg/dL — ABNORMAL HIGH (ref 70–99)
Glucose-Capillary: 118 mg/dL — ABNORMAL HIGH (ref 70–99)
Glucose-Capillary: 119 mg/dL — ABNORMAL HIGH (ref 70–99)
Glucose-Capillary: 121 mg/dL — ABNORMAL HIGH (ref 70–99)
Glucose-Capillary: 133 mg/dL — ABNORMAL HIGH (ref 70–99)
Glucose-Capillary: 138 mg/dL — ABNORMAL HIGH (ref 70–99)
Glucose-Capillary: 110 mg/dL — ABNORMAL HIGH (ref 70–99)
Glucose-Capillary: 125 mg/dL — ABNORMAL HIGH (ref 70–99)
Glucose-Capillary: 86 mg/dL (ref 70–99)

## 2010-10-14 LAB — MRSA PCR SCREENING: MRSA by PCR: NEGATIVE

## 2010-10-14 LAB — COMPREHENSIVE METABOLIC PANEL
ALT: 59 U/L — ABNORMAL HIGH (ref 0–53)
ALT: 42 U/L (ref 0–53)
AST: 54 U/L — ABNORMAL HIGH (ref 0–37)
AST: 33 U/L (ref 0–37)
Albumin: 2.2 g/dL — ABNORMAL LOW (ref 3.5–5.2)
Albumin: 2.1 g/dL — ABNORMAL LOW (ref 3.5–5.2)
Alkaline Phosphatase: 362 U/L — ABNORMAL HIGH (ref 39–117)
Alkaline Phosphatase: 300 U/L — ABNORMAL HIGH (ref 39–117)
BUN: 15 mg/dL (ref 6–23)
BUN: 11 mg/dL (ref 6–23)
CO2: 28 mEq/L (ref 19–32)
CO2: 27 mEq/L (ref 19–32)
Calcium: 8.7 mg/dL (ref 8.4–10.5)
Calcium: 8.5 mg/dL (ref 8.4–10.5)
Chloride: 104 mEq/L (ref 96–112)
Chloride: 108 mEq/L (ref 96–112)
Creatinine, Ser: 0.9 mg/dL (ref 0.4–1.5)
Creatinine, Ser: 0.83 mg/dL (ref 0.4–1.5)
GFR calc Af Amer: >60 mL/min (ref >60)
GFR calc Af Amer: >60 mL/min (ref >60)
GFR calc non Af Amer: >60 mL/min (ref >60)
GFR calc non Af Amer: >60 mL/min (ref >60)
Glucose, Bld: 145 mg/dL — ABNORMAL HIGH (ref 70–99)
Glucose, Bld: 119 mg/dL — ABNORMAL HIGH (ref 70–99)
Potassium: 4 mEq/L (ref 3.5–5.1)
Potassium: 3.5 mEq/L (ref 3.5–5.1)
Sodium: 140 mEq/L (ref 135–145)
Sodium: 141 mEq/L (ref 135–145)
Total Bilirubin: 1.1 mg/dL (ref 0.3–1.2)
Total Bilirubin: 0.8 mg/dL (ref 0.3–1.2)
Total Protein: 5.4 g/dL — ABNORMAL LOW (ref 6.0–8.3)
Total Protein: 5.5 g/dL — ABNORMAL LOW (ref 6.0–8.3)

## 2010-10-14 LAB — BASIC METABOLIC PANEL
BUN: 12 mg/dL (ref 6–23)
CO2: 28 mEq/L (ref 19–32)
Calcium: 8.1 mg/dL — ABNORMAL LOW (ref 8.4–10.5)
Chloride: 103 mEq/L (ref 96–112)
Creatinine, Ser: 0.79 mg/dL (ref 0.4–1.5)
GFR calc Af Amer: >60 mL/min (ref >60)
GFR calc non Af Amer: >60 mL/min (ref >60)
Glucose, Bld: 126 mg/dL — ABNORMAL HIGH (ref 70–99)
Potassium: 3.6 mEq/L (ref 3.5–5.1)
Sodium: 138 mEq/L (ref 135–145)

## 2010-10-15 LAB — GLUCOSE, CAPILLARY
Glucose-Capillary: 110 mg/dL — ABNORMAL HIGH (ref 70–99)
Glucose-Capillary: 105 mg/dL — ABNORMAL HIGH (ref 70–99)
Glucose-Capillary: 140 mg/dL — ABNORMAL HIGH (ref 70–99)
Glucose-Capillary: 152 mg/dL — ABNORMAL HIGH (ref 70–99)
Glucose-Capillary: 108 mg/dL — ABNORMAL HIGH (ref 70–99)
Glucose-Capillary: 134 mg/dL — ABNORMAL HIGH (ref 70–99)
Glucose-Capillary: 113 mg/dL — ABNORMAL HIGH (ref 70–99)
Glucose-Capillary: 129 mg/dL — ABNORMAL HIGH (ref 70–99)

## 2010-10-15 LAB — CBC
HCT: 30.3 % — ABNORMAL LOW (ref 39.0–52.0)
Hemoglobin: 9.9 g/dL — ABNORMAL LOW (ref 13.0–17.0)
MCH: 30.5 pg (ref 26.0–34.0)
MCHC: 32.7 g/dL (ref 30.0–36.0)
MCV: 93.2 fL (ref 78.0–100.0)
Platelets: 414 10*3/uL — ABNORMAL HIGH (ref 150–400)
RBC: 3.25 MIL/uL — ABNORMAL LOW (ref 4.22–5.81)
RDW: 15.6 % — ABNORMAL HIGH (ref 11.5–15.5)
WBC: 6.2 10*3/uL (ref 4.0–10.5)

## 2010-10-15 LAB — COMPREHENSIVE METABOLIC PANEL
ALT: 35 U/L (ref 0–53)
AST: 31 U/L (ref 0–37)
Albumin: 2.3 g/dL — ABNORMAL LOW (ref 3.5–5.2)
Alkaline Phosphatase: 280 U/L — ABNORMAL HIGH (ref 39–117)
BUN: 10 mg/dL (ref 6–23)
CO2: 26 mEq/L (ref 19–32)
Calcium: 8.9 mg/dL (ref 8.4–10.5)
Chloride: 106 mEq/L (ref 96–112)
Creatinine, Ser: 0.88 mg/dL (ref 0.4–1.5)
GFR calc Af Amer: >60 mL/min (ref >60)
GFR calc non Af Amer: >60 mL/min (ref >60)
Glucose, Bld: 120 mg/dL — ABNORMAL HIGH (ref 70–99)
Potassium: 3.7 mEq/L (ref 3.5–5.1)
Sodium: 143 mEq/L (ref 135–145)
Total Bilirubin: 0.7 mg/dL (ref 0.3–1.2)
Total Protein: 5.6 g/dL — ABNORMAL LOW (ref 6.0–8.3)

## 2010-10-15 LAB — PREALBUMIN: Prealbumin: 15.1 mg/dL — ABNORMAL LOW (ref 17.0–34.0)

## 2010-10-16 LAB — GLUCOSE, CAPILLARY
Glucose-Capillary: 111 mg/dL — ABNORMAL HIGH (ref 70–99)
Glucose-Capillary: 127 mg/dL — ABNORMAL HIGH (ref 70–99)
Glucose-Capillary: 139 mg/dL — ABNORMAL HIGH (ref 70–99)
Glucose-Capillary: 147 mg/dL — ABNORMAL HIGH (ref 70–99)

## 2010-10-17 LAB — CBC
HCT: 32.2 % — ABNORMAL LOW (ref 39.0–52.0)
Hemoglobin: 10.4 g/dL — ABNORMAL LOW (ref 13.0–17.0)
MCH: 30 pg (ref 26.0–34.0)
MCHC: 32.3 g/dL (ref 30.0–36.0)
MCV: 92.8 fL (ref 78.0–100.0)
Platelets: 402 10*3/uL — ABNORMAL HIGH (ref 150–400)
RBC: 3.47 MIL/uL — ABNORMAL LOW (ref 4.22–5.81)
RDW: 15.2 % (ref 11.5–15.5)
WBC: 5.9 10*3/uL (ref 4.0–10.5)

## 2010-10-17 LAB — BASIC METABOLIC PANEL
BUN: 12 mg/dL (ref 6–23)
CO2: 26 mEq/L (ref 19–32)
Calcium: 9 mg/dL (ref 8.4–10.5)
Chloride: 106 mEq/L (ref 96–112)
Creatinine, Ser: 0.81 mg/dL (ref 0.4–1.5)
GFR calc Af Amer: >60 mL/min (ref >60)
GFR calc non Af Amer: >60 mL/min (ref >60)
Glucose, Bld: 123 mg/dL — ABNORMAL HIGH (ref 70–99)
Potassium: 3.7 mEq/L (ref 3.5–5.1)
Sodium: 141 mEq/L (ref 135–145)

## 2010-10-17 LAB — GLUCOSE, CAPILLARY
Glucose-Capillary: 140 mg/dL — ABNORMAL HIGH (ref 70–99)
Glucose-Capillary: 111 mg/dL — ABNORMAL HIGH (ref 70–99)
Glucose-Capillary: 117 mg/dL — ABNORMAL HIGH (ref 70–99)
Glucose-Capillary: 133 mg/dL — ABNORMAL HIGH (ref 70–99)
Glucose-Capillary: 166 mg/dL — ABNORMAL HIGH (ref 70–99)

## 2010-10-18 LAB — GLUCOSE, CAPILLARY
Glucose-Capillary: 119 mg/dL — ABNORMAL HIGH (ref 70–99)
Glucose-Capillary: 114 mg/dL — ABNORMAL HIGH (ref 70–99)
Glucose-Capillary: 121 mg/dL — ABNORMAL HIGH (ref 70–99)

## 2010-10-19 LAB — COMPREHENSIVE METABOLIC PANEL
ALT: 34 U/L (ref 0–53)
AST: 32 U/L (ref 0–37)
Albumin: 2.6 g/dL — ABNORMAL LOW (ref 3.5–5.2)
Alkaline Phosphatase: 246 U/L — ABNORMAL HIGH (ref 39–117)
BUN: 15 mg/dL (ref 6–23)
CO2: 26 mEq/L (ref 19–32)
Calcium: 8.7 mg/dL (ref 8.4–10.5)
Chloride: 103 mEq/L (ref 96–112)
Creatinine, Ser: 0.88 mg/dL (ref 0.4–1.5)
GFR calc Af Amer: >60 mL/min (ref >60)
GFR calc non Af Amer: >60 mL/min (ref >60)
Glucose, Bld: 121 mg/dL — ABNORMAL HIGH (ref 70–99)
Potassium: 3.6 mEq/L (ref 3.5–5.1)
Sodium: 138 mEq/L (ref 135–145)
Total Bilirubin: 0.5 mg/dL (ref 0.3–1.2)
Total Protein: 6.1 g/dL (ref 6.0–8.3)

## 2010-10-19 LAB — CBC
HCT: 33.5 % — ABNORMAL LOW (ref 39.0–52.0)
Hemoglobin: 10.7 g/dL — ABNORMAL LOW (ref 13.0–17.0)
MCH: 29.6 pg (ref 26.0–34.0)
MCHC: 31.9 g/dL (ref 30.0–36.0)
MCV: 92.5 fL (ref 78.0–100.0)
Platelets: 389 10*3/uL (ref 150–400)
RBC: 3.62 MIL/uL — ABNORMAL LOW (ref 4.22–5.81)
RDW: 14.9 % (ref 11.5–15.5)
WBC: 5.8 10*3/uL (ref 4.0–10.5)

## 2010-10-19 LAB — GLUCOSE, CAPILLARY: Glucose-Capillary: 105 mg/dL — ABNORMAL HIGH (ref 70–99)

## 2010-10-20 LAB — GLUCOSE, CAPILLARY: Glucose-Capillary: 141 mg/dL — ABNORMAL HIGH (ref 70–99)

## 2010-10-21 ENCOUNTER — Other Ambulatory Visit: Payer: Self-pay | Admitting: Thoracic Surgery

## 2010-10-21 DIAGNOSIS — IMO0001 Reserved for inherently not codable concepts without codable children: Secondary | ICD-10-CM

## 2010-10-22 ENCOUNTER — Ambulatory Visit: Payer: BC Managed Care – PPO | Admitting: Thoracic Surgery

## 2010-10-22 ENCOUNTER — Ambulatory Visit
Admission: RE | Admit: 2010-10-22 | Discharge: 2010-10-22 | Disposition: A | Discharge status: Home / Self Care | Payer: BC Managed Care – PPO | Source: Ambulatory Visit | Attending: Thoracic Surgery | Admitting: Thoracic Surgery

## 2010-10-22 DIAGNOSIS — IMO0001 Reserved for inherently not codable concepts without codable children: Secondary | ICD-10-CM

## 2010-10-22 DIAGNOSIS — C159 Malignant neoplasm of esophagus, unspecified: Secondary | ICD-10-CM

## 2010-10-26 NOTE — Discharge Summary (Signed)
[name], [name]            ACCOUNT NO.:  [id number]  MEDICAL RECORD NO.:  [id number]          PATIENT TYPE:  INP  LOCATION:  3314                         FACILITY:  [facility]  PHYSICIAN:  Ines Bloomer, M.D. DATE OF BIRTH:  1946/02/01  DATE OF ADMISSION:  09/28/2010 DATE OF DISCHARGE:  10/19/2010                              DISCHARGE SUMMARY   PRIMARY ADMITTING DIAGNOSIS:  Stage III adenocarcinoma of the esophagus, status post chemotherapy and radiation.  ADDITIONAL/DISCHARGE DIAGNOSES: 1. Stage III adenocarcinoma of the esophagus, status post chemotherapy     and radiation. 2. Postoperative anastomotic leak. 3. Postoperative respiratory failure. 4. History of prostate cancer in 2011. 5. Postoperative atrial fibrillation. 6. Prior history of tobacco abuse. 7. Hypertension.  PROCEDURES PERFORMED: 1. Transhiatal esophagectomy, pyloroplasty, and jejunostomy. 2. Left neck exploration with wound debridement and packing of neck. 3. Insertion of bilateral chest tubes and right subclavian catheter.  HISTORY OF PRESENT ILLNESS:  The patient is a 65 year old male who initially presented with dysphagia.  He was seen in the fall of 2011 by Dr. Dorena Cookey and underwent an endoscopy which revealed adenocarcinoma of the distal esophagus going into the cardia.  A PET scan was performed which showed increased uptake in this area with a question of some gastrohepatic ligament nodes.  He was seen by Dr. [name] and was started on radiation and chemotherapy.  He was then referred to Dr. Edwyna Shell for consideration of surgical resection.  Dr. Edwyna Shell has followed him since October 2011 and has obtained cardiac clearance from Dr. Anne Fu.  He has now completed chemotherapy and it is felt that at this time he should proceed with an esophagogastrectomy.  All risks, benefits, and alternatives of surgery were explained to the patient. His family and he agreed to proceed.  HOSPITAL COURSE:  Mr.  Dupin was admitted to [facility] on September 28, 2010, in preparation for surgery.  He was started on IV antibiotics and hydration.  He was taken to the operating room on September 29, 2010, and underwent a transhiatal esophagectomy as described above.  Please see previously dictated operative report for complete details of surgery.  He tolerated the procedure well and was transferred to the SICU in stable condition.  He was extubated shortly after surgery.  He initially did well postoperatively and was able to be transferred to the Step-Down Unit on postop day #5.  He did develop atrial fibrillation and was started on IV amiodarone and converted to normal sinus rhythm.  He was started on tube feeds while awaiting return of his swallowing function.  His left neck drain was slowly advanced and discontinued.  He underwent a gastrograffin swallowing study on October 07, 2009, and there was no evidence of anastomotic leak.  At that time, he was started on clear liquid diet which he seemed to tolerate well. His diet was advanced to a soft regular diet.  He initially tolerated this well and then developed a forceful coughing episode during which time his JP drain fell out.  Shortly after this, he was noted to have significant drainage from the previous Jackson-Pratt drain site.  He was returned to  the operating room that afternoon for exploration and wound debridement.  His wound was left open and packed.  Intraoperative cultures were obtained.  He initially did well and was extubated in the operating room, however, shortly after the extubation he became agitated and his oxygen saturations dropped down to the 60% range.  Anesthesia decided to reintubate the patient and allowed him to slowly wean from the ventilator secondary to his respiratory distress.  He was also noted on chest x-ray to have pleural effusions, so Dr. Edwyna Shell inserted bilateral chest tubes at the time of his  reintubation.  He was returned to the SICU in stable condition.  He was allowed to slowly wean from the vent.  He was extubated the following day.  Since that time, he has remained stable.  He has had no further arrhythmias.  His neck wound is slowly granulating and dressing changes continued to be performed daily. Intraoperative cultures showed mixed flora.  The patient was continued on vancomycin for an additional 10 days following his neck reexploration.  He has remained afebrile and vital signs have been stable.  His wound continues to have some drainage, but overall is granulating well.  He has remained afebrile and his vital signs have been stable.  He was restarted on sips of liquids and his diet has been advanced accordingly and now to a soft diet with frequent small meals. He continues on tube feeds as well as supplements.  His most recent labs on October 17, 2010, show a sodium of 141, potassium 3.7, BUN 12, creatinine 0.81, white count 5.9, hemoglobin 10.4, hematocrit 32.2, platelets 402, prealbumin 15.1.  His latest chest x-ray shows small right apical pneumothorax and small bilateral pleural effusions.  He will be reevaluated on morning rounds on October 19, 2010, by Dr. Edwyna Shell.  It is anticipated if he has continued to remain stable at that time he will be ready for discharge home.  DISCHARGE MEDICATIONS: 1. Amiodarone 200 mg b.i.d. 2. Hydrocodone/APAP 5/500 elixir 5-10 mL q.6 h. p.r.n. for pain. 3. Metoprolol 12.5 mg b.i.d. 4. Prilosec 20 mg b.i.d.  Of note, all medications are to be given via jejunostomy tube.  DIET:  He is to continue a soft diet in small amounts.  He will continue Jevity via his jejunostomy tube at 70 mL per hour with a ProSource supplements 30 mL daily.  DISCHARGE INSTRUCTIONS:  He is to refrain from driving, lifting, or strenuous activity.  He may continue ambulating daily and using his incentive spirometer.  WOUND CARE:  He may clean his  abdominal incision with soap and water. His neck wound is to be packed daily with dry gauze and as needed, otherwise.  DISCHARGE FOLLOWUP:  He will see Dr. Edwyna Shell back in the office in 1 week with a chest x-ray.  Home health has been arranged to assist with wound management and tube feeds.  He may call in the interim if he experiences any problems or has questions.     [name], P.A.   ______________________________ Ines Bloomer, M.D.    GC/MEDQ  D:  10/18/2010  T:  10/19/2010  Job:  [id number]  cc:   Jillyn Hidden B. [name], M.D. John C. Madilyn Fireman, M.D.  Electronically Signed by Weldon Inches. on 10/23/2010 01:09:35 PM Electronically Signed by Jovita Gamma M.D. on 10/26/2010 04:57:02 PM

## 2010-10-26 NOTE — Discharge Summary (Signed)
  [name], [name]            ACCOUNT NO.:  [id number]  MEDICAL RECORD NO.:  [id number]          PATIENT TYPE:  INP  LOCATION:  3314                         FACILITY:  [facility]  PHYSICIAN:  [name], M.D. DATE OF BIRTH:  23-Mar-1946  DATE OF ADMISSION:  09/28/2010 DATE OF DISCHARGE:  10/19/2010                              DISCHARGE SUMMARY   ADDENDUM  [name] was seen and evaluated by Dr. Edwyna Shell on the morning of October 19, 2010, and has been deemed ready for discharge home at this time.  His labs this morning are stable with a sodium 138, potassium 3.6, BUN 15, creatinine 0.88, white count 5.8, hemoglobin 10.7, hematocrit 33.5, and platelets 389.  Chest x-ray with resolved pneumothorax, decreasing basilar pleural effusions.  His p.o. intake has improved and Dr. Edwyna Shell has decreased his tube feeds.  DISCHARGE MEDICATIONS: 1. Amiodarone 200 mg daily. 2. Lortab Elixir 5/500, 5-10 mL q.6 h. p.r.n. for pain. 3. Metoprolol 12.5 mg b.i.d. 4. Prilosec 20 mg b.i.d. 5. Tube feeds are Jevity 60 mL per hour and ProSource 30 mL daily.  All medications are to be given via the jejunostomy tube.  All other discharge instructions and follow-ups are unchanged from the previously dictated discharge summary.  We will see the patient back in the office on Wednesday, October 22, 2010, and office will call the patient with an appointment.     [name], P.A.   ______________________________ [name], M.D.    GC/MEDQ  D:  10/19/2010  T:  10/19/2010  Job:  [id number]  cc:   Jillyn Hidden B. [name], M.D. John C. Madilyn Fireman, M.D. [facility] Office  Electronically Signed by [name] P.A. on 10/23/2010 01:09:49 PM Electronically Signed by Jovita Gamma M.D. on 10/26/2010 04:57:11 PM

## 2010-10-27 ENCOUNTER — Other Ambulatory Visit: Payer: Self-pay | Admitting: Thoracic Surgery

## 2010-10-27 DIAGNOSIS — C159 Malignant neoplasm of esophagus, unspecified: Secondary | ICD-10-CM

## 2010-10-28 ENCOUNTER — Ambulatory Visit (INDEPENDENT_AMBULATORY_CARE_PROVIDER_SITE_OTHER): Payer: BC Managed Care – PPO | Admitting: Thoracic Surgery

## 2010-10-28 ENCOUNTER — Ambulatory Visit
Admission: RE | Admit: 2010-10-28 | Discharge: 2010-10-28 | Disposition: A | Discharge status: Home / Self Care | Payer: BC Managed Care – PPO | Source: Ambulatory Visit | Attending: Thoracic Surgery | Admitting: Thoracic Surgery

## 2010-10-28 DIAGNOSIS — C159 Malignant neoplasm of esophagus, unspecified: Secondary | ICD-10-CM

## 2010-10-31 NOTE — Assessment & Plan Note (Signed)
OFFICE VISIT  [name], [name] DOB:  [birth year]                                        October 29, 2010 CHART #:  [id number]  The patient came today and he is now eating as much of 2000 calories a day.  We stopped his tube feedings because this caused him to have two much diarrhea and gas.  He still is draining a little more than I like from his neck incision, but it is slowly closing down.  There is evidence of granulation tissues that closes down.  His incision ostomy site is well healed.  His blood pressure is 111/75, pulse 100, respirations 20, saturations were 95%.  His weight is 170 pounds.  I will see him back again in one week to check on his progress.  Chest x- ray shows normal postoperative changes with no problems as far as his emptying of his stomach  [name], M.D. Electronically Signed  DPB/MEDQ  D:  10/29/2010  T:  10/30/2010  Job:  [id number]

## 2010-11-03 NOTE — Letter (Signed)
October 22, 2010  Leighton Roach. [name], M.D. [address] [place], [place] [address]  Re:  CLERANCE, UMLAND              DOB:  1945/11/10  Dear Dr. [name]:  The patient came today and he is eating satisfactorily small amounts. We still have him on 60 mL of tube feeding daily hourly, but we are going to cut him down to just 70 mL for 12 hours and having meat during the day.  He still draining a little bit of purulent material through his neck wound but this is gradually improving.  I repacked it today and this continues to close down.  He is swallowing better.  His blood pressure is 106/75, pulse 98, respirations were 19, weight was 171.5 pounds.  Overall, he is making some progress.  His chest x-ray showed small air-fluid level in the stomach but no great distention.  He says he is passing a lot of gas particularly after eating.  We will cut his tube feedings down, continue him on the amiodarone daily, metoprolol twice a day, the Jevity 1.2 at nite as well as pain medication as an elixir.  We will see him back again in 6 days with another chest x-ray.  Ines Bloomer, M.D. Electronically Signed  DPB/MEDQ  D:  10/22/2010  T:  10/23/2010  Job:  [id number]

## 2010-11-04 ENCOUNTER — Ambulatory Visit (INDEPENDENT_AMBULATORY_CARE_PROVIDER_SITE_OTHER): Payer: Self-pay | Admitting: Thoracic Surgery

## 2010-11-04 DIAGNOSIS — C159 Malignant neoplasm of esophagus, unspecified: Secondary | ICD-10-CM

## 2010-11-04 NOTE — Assessment & Plan Note (Signed)
OFFICE VISIT  [name], [name] DOB:  03-31-1946                                        November 04, 2010 CHART #:  [id number]  The patient came today and his fistula is almost closed.  He is still having just a small amount of drainage.  He is swallowing well.  His weight is 168, is down 2 pounds which is fairly stable.  I removed his chest tube sutures.  I told him he is still having some problems with diarrhea and then we will try to do more of post gastrectomy, diet with liquid separated from the solids and also we would try p.r.n. Imodium. He still is somewhat hoarse, but this is improving.  Plan is to see him in 2 weeks and if he continues to be stable, we will pull his jejunostomy and refer him to ENT, because I am sure he has got some weakness in his vocal cords, but overall he is continued to make improvements.  I did stop his Lopressor today.  Ines Bloomer, M.D. Electronically Signed  DPB/MEDQ  D:  11/04/2010  T:  11/04/2010  Job:  [id number]

## 2010-11-17 ENCOUNTER — Other Ambulatory Visit: Payer: Self-pay | Admitting: Thoracic Surgery

## 2010-11-17 DIAGNOSIS — C159 Malignant neoplasm of esophagus, unspecified: Secondary | ICD-10-CM

## 2010-11-18 ENCOUNTER — Other Ambulatory Visit: Payer: Self-pay | Admitting: Thoracic Surgery

## 2010-11-18 ENCOUNTER — Ambulatory Visit (INDEPENDENT_AMBULATORY_CARE_PROVIDER_SITE_OTHER): Payer: Self-pay | Admitting: Thoracic Surgery

## 2010-11-18 DIAGNOSIS — C160 Malignant neoplasm of cardia: Secondary | ICD-10-CM

## 2010-11-19 NOTE — Assessment & Plan Note (Signed)
OFFICE VISIT  ALMON, WHITFORD DOB:  [birth year]                                        November 18, 2010 CHART #:  [id number]  HISTORY:  The patient is a 65 year old gentleman with stage III adenocarcinoma of the esophagus status post chemotherapy and radiation. He underwent transhiatal esophagectomy and pyloroplasty with jejunostomy on September 29, 2010.  He subsequently had re-exploration of the left neck and wound debridement with packing due to anastomotic leak.  This was done on October 08, 2010.  He was seen today at office in routine followup.  Currently, he reports that he is slowly feeling better.  He has had some hoarseness postoperatively, which persists but is also slightly better.  The left neck wound continues to have a large amount of drainage at times.  He denies fevers or chills.  He does have significant intermittent bouts of diarrhea.  PHYSICAL EXAMINATION:  Vital Signs:  Blood pressure is 99/70, pulse 73, respirations 16, oxygen saturation is 96% on room air.  Today's weight is 166.  His last weight was 168.  General Appearance:  Well-developed adult male, in no acute distress.  He does appear more robust than previously.  Pulmonary examination reveals clear lungs bilaterally. Cardiac Examination:  Regular rate and rhythm.  The left neck wound was probed with a Q-tip.  It does follow a tract approximately 1 inch in length.  There is moderate amount of purulent drainage.  The abdominal incision and jejunostomy site are all healing well.  On today's date, we did remove the jejunostomy tube as he is not no longer using in feedings.  ASSESSMENT:  The patient was making a slow steady recovery following his surgery.  Our plan at this point is to try a course of Lomotil for the diarrhea.  He was given a prescription for t.i.d. use but up to 6 tablets a day p.r.n.  We also renewed his hydrocodone prescription.  We will see him in 2 weeks, at  which time he will have a barium swallow.  Other plans will be determined based on the results of this study and his clinical course.  Rowe Clack, P.A.-C.  Sherryll Burger  D:  11/18/2010  T:  11/19/2010  Job:  [id number]  cc:   Everardo All. Madilyn Fireman, M.D.

## 2010-12-01 ENCOUNTER — Other Ambulatory Visit: Payer: Self-pay | Admitting: Thoracic Surgery

## 2010-12-01 DIAGNOSIS — C159 Malignant neoplasm of esophagus, unspecified: Secondary | ICD-10-CM

## 2010-12-02 ENCOUNTER — Ambulatory Visit
Admission: RE | Admit: 2010-12-02 | Discharge: 2010-12-02 | Disposition: A | Discharge status: Home / Self Care | Payer: Medicare Other | Source: Ambulatory Visit | Attending: Thoracic Surgery | Admitting: Thoracic Surgery

## 2010-12-02 ENCOUNTER — Ambulatory Visit (INDEPENDENT_AMBULATORY_CARE_PROVIDER_SITE_OTHER): Payer: Self-pay | Admitting: Thoracic Surgery

## 2010-12-02 DIAGNOSIS — C159 Malignant neoplasm of esophagus, unspecified: Secondary | ICD-10-CM

## 2010-12-02 LAB — GLUCOSE, CAPILLARY: Glucose-Capillary: 99 mg/dL (ref 70–99)

## 2010-12-03 NOTE — Assessment & Plan Note (Signed)
OFFICE VISIT  MONTRAIL, MEHRER DOB:  November 18, 1945                                        December 02, 2010 CHART #:  [id number]  HISTORY OF PRESENT ILLNESS:  [name] comes in today for 2-week followup.  He is status post transhiatal esophagectomy with pyloroplasty and jejunostomy on September 29, 2010.  He subsequently had reexploration of the left neck with wound debridement and packing secondary to an anastomotic leak on October 08, 2010.  On his last office visit, he was doing well.  However approximately 1 week ago, he developed leakage of some liquid and food particles from his left neck wound.  He informed the office and spoke with the nurse who also talked with Dr. Tyrone Sage. It was recommended that he proceed with his office appointment and barium swallow as scheduled for today.  Since that time, the patient states that the leakage has stopped.  Overall, he is slowly regaining his strength.  He continues to eat 5-6 small meals per day and seems to be tolerating this without problem.  He continues to have 1-2 episodes of diarrhea per day but this again is improving and seems to be better with the Lomotil and hydrocodone.  PHYSICAL EXAMINATION:  VITAL SIGNS:  Blood pressure is 130/84, pulse is 79, respirations 16, O2 sat 98% on room air.  His weight today is 166 which is stable from his last visit 2 weeks ago. NECK:  His left neck wound appears to be granulating well, and the sinus tract has closed.  There is no drainage or erythema. ABDOMEN:  His abdominal wound has healed well.  Barium swallow performed today shows no evidence of leak.  ASSESSMENT/PLAN:  Dr. Edwyna Shell saw the patient today and reviewed his studies.  He has recommended continued small meals, multiple times daily, and he has renewed his prescription for Lomotil and hydrocodone.  He will continue local wound care to the left neck.  We will see him back in 2 weeks for recheck.  He may call  in the interim if he experiences any problems.  [name], P.A.  GC/MEDQ  D:  12/02/2010  T:  12/03/2010  Job:  [id number]  cc:   Everardo All. Madilyn Fireman, M.D.

## 2010-12-04 LAB — POCT I-STAT, CHEM 8
BUN: 11 mg/dL (ref 6–23)
Calcium, Ion: 1.1 mmol/L — ABNORMAL LOW (ref 1.12–1.32)
Chloride: 105 mEq/L (ref 96–112)
Creatinine, Ser: 0.9 mg/dL (ref 0.4–1.5)
Glucose, Bld: 106 mg/dL — ABNORMAL HIGH (ref 70–99)
HCT: 49 % (ref 39.0–52.0)
Hemoglobin: 16.7 g/dL (ref 13.0–17.0)
Potassium: 3.8 mEq/L (ref 3.5–5.1)
Sodium: 138 mEq/L (ref 135–145)
TCO2: 27 mmol/L (ref 0–100)

## 2010-12-04 LAB — GLUCOSE, CAPILLARY: Glucose-Capillary: 117 mg/dL — ABNORMAL HIGH (ref 70–99)

## 2010-12-11 ENCOUNTER — Encounter (HOSPITAL_BASED_OUTPATIENT_CLINIC_OR_DEPARTMENT_OTHER): Payer: Medicare Other | Admitting: Oncology

## 2010-12-11 DIAGNOSIS — R21 Rash and other nonspecific skin eruption: Secondary | ICD-10-CM

## 2010-12-11 DIAGNOSIS — I10 Essential (primary) hypertension: Secondary | ICD-10-CM

## 2010-12-11 DIAGNOSIS — C61 Malignant neoplasm of prostate: Secondary | ICD-10-CM

## 2010-12-11 DIAGNOSIS — C155 Malignant neoplasm of lower third of esophagus: Secondary | ICD-10-CM

## 2010-12-15 ENCOUNTER — Other Ambulatory Visit: Payer: Self-pay | Admitting: Thoracic Surgery

## 2010-12-15 DIAGNOSIS — C159 Malignant neoplasm of esophagus, unspecified: Secondary | ICD-10-CM

## 2010-12-16 ENCOUNTER — Ambulatory Visit
Admission: RE | Admit: 2010-12-16 | Discharge: 2010-12-16 | Disposition: A | Discharge status: Home / Self Care | Payer: Medicare Other | Source: Ambulatory Visit | Attending: Thoracic Surgery | Admitting: Thoracic Surgery

## 2010-12-16 ENCOUNTER — Ambulatory Visit (INDEPENDENT_AMBULATORY_CARE_PROVIDER_SITE_OTHER): Payer: Self-pay | Admitting: Thoracic Surgery

## 2010-12-16 DIAGNOSIS — C159 Malignant neoplasm of esophagus, unspecified: Secondary | ICD-10-CM

## 2010-12-17 NOTE — Assessment & Plan Note (Unsigned)
OFFICE VISIT  [name], [name] DOB:  02-05-1946                                        December 16, 2010 CHART #:  [id number]  REASON FOR OFFICE VISIT:  Followup left neck wound.  HISTORY OF PRESENT ILLNESS:  This is a 65 year old Caucasian male who is status post transhiatal esophagectomy with pyloroplasty and jejunostomy on September 29, 2010.  He then required re-exploration of the left neck with wound debridement and packing secondary to an anastomotic leak on October 08, 2010.  He was last seen in the office on December 02, 2010.  At that time, he reported complaints of some leakage of some liquid and food particles from his left neck wound, however, it did stop.  He has had no recurrence of this.  He is still eating approximately 5-6 meals per day.  His weight has been between 165 and 168 pounds and he is slowly getting stronger.  He does continue to have 1-2 episodes of diarrhea that usually occur in the morning or in the afternoon, although Lomotil does seem to help.  In addition, he has had persistent hoarseness since his surgery, although at times this does seem to improve, it still does continue.  PHYSICAL EXAMINATION:  General:  This is a pleasant 65 year old Caucasian male who is in no acute distress who is alert, oriented, cooperative, and accompanied by his wife.  Vital Signs:  BP 129/83, heart rate 94, respiration rate 14, O2 sat 96% on room air. Cardiovascular:  Regular rate and rhythm.  Pulmonary:  Clear to auscultation bilaterally.  No rales, wheezes, or rhonchi.  Abdomen: Soft, nontender.  Bowel sounds present.  Neck:  Left neck wound the lower inferior portion is granulating.  There is no drainage, erythema, or sinus tracts seen.  IMAGING:  Chest x-ray done today shows small residual right pleural effusion and resolution of previous left pleural effusion.  There is an air fluid level to distal gastric pull-through.  IMPRESSION AND PLAN:   Overall, the patient's left neck wound continues to improve.  Dr. Edwyna Shell did place some silver nitrate on the wound.  Dr. Edwyna Shell has discussed with the patient the need for referral to an ENT regarding persistent hoarseness.  He will return to see Dr. Edwyna Shell for a followup appointment for weeks.  He will also need to have a repeat CAT scan in approximately 6 months, at which time he will again follow up with Dr. [name] (who he had seen last week).  The patient inquired about resuming enteric-coated aspirin which he had been on preoperatively and he was instructed he may do so.  He was also instructed to continue the Zantac for now.  Dr. Edwyna Shell saw and evaluated the patient and reviewed the patient's chest x-ray.  Doree Fudge, PA  DZ/MEDQ  D:  12/16/2010  T:  12/17/2010  Job:  [id number]  cc:   Quenton Fetter, M.D.

## 2011-01-13 ENCOUNTER — Ambulatory Visit (INDEPENDENT_AMBULATORY_CARE_PROVIDER_SITE_OTHER): Payer: Medicare Other | Admitting: Thoracic Surgery

## 2011-01-13 DIAGNOSIS — C159 Malignant neoplasm of esophagus, unspecified: Secondary | ICD-10-CM

## 2011-01-14 NOTE — Assessment & Plan Note (Signed)
OFFICE VISIT  PAL, SHELL DOB:  [birth year]                                        January 13, 2011 CHART #:  [id number]  The patient came today and he is doing well over all.  He saw Dr. [name] who confirmed the left vocal cord paralysis, he has planned an injection on Jan 26, 2011.  His incisions are now healed completely.  He has gained 3 pounds.  He is up to 168 pounds now and is back to full activity.  He still has some intermittent problems with diarrhea and when he gets that, he takes some Lortab which seems to help that.  I gave him prescription for Lortab 5-500 #50 with one refill today.  I will see him back again in 6 weeks with a chest x-ray.  We will schedule a CT scan in July.  Ines Bloomer, M.D. Electronically Signed  DPB/MEDQ  D:  01/13/2011  T:  01/14/2011  Job:  [id number]  cc:   Ladene Artist, M.D. Gloris Manchester. [name], M.D.

## 2011-01-26 ENCOUNTER — Ambulatory Visit (HOSPITAL_BASED_OUTPATIENT_CLINIC_OR_DEPARTMENT_OTHER)
Admission: RE | Admit: 2011-01-26 | Discharge: 2011-01-26 | Disposition: A | Discharge status: Home / Self Care | Payer: Medicare Other | Source: Ambulatory Visit | Attending: Otolaryngology | Admitting: Otolaryngology

## 2011-01-26 DIAGNOSIS — J3801 Paralysis of vocal cords and larynx, unilateral: Secondary | ICD-10-CM | POA: Insufficient documentation

## 2011-01-26 DIAGNOSIS — I10 Essential (primary) hypertension: Secondary | ICD-10-CM | POA: Insufficient documentation

## 2011-01-26 DIAGNOSIS — Z01812 Encounter for preprocedural laboratory examination: Secondary | ICD-10-CM | POA: Insufficient documentation

## 2011-01-26 DIAGNOSIS — Z87891 Personal history of nicotine dependence: Secondary | ICD-10-CM | POA: Insufficient documentation

## 2011-01-26 DIAGNOSIS — Z8501 Personal history of malignant neoplasm of esophagus: Secondary | ICD-10-CM | POA: Insufficient documentation

## 2011-01-26 LAB — POCT HEMOGLOBIN-HEMACUE: Hemoglobin: 12.2 g/dL — ABNORMAL LOW (ref 13.0–17.0)

## 2011-02-03 NOTE — Letter (Signed)
July 15, 2010   Leighton Roach. [name], MD  [address]  [place], [place] [address]   Re:  [name], [name]              DOB:  1946/07/14   Dear Nida Boatman:   I appreciate the opportunity of seeing the patient.  This 65 year old  guy was starting to have dysphagia and underwent a endoscopy by Dr. Dorena Cookey which revealed adenocarcinoma of the distal esophagus going into  the cardia.  PET scan was positive in this area, and there were some  questions of some gastrohepatic ligament nodes or celiac nodes, so he is  obviously at least clinical stage of T3 N1 M0.  He also has a history of  stage IC prostate cancer in August 2011, Gleason 6.  He had some  dysphagia.  He was started on chemotherapy and radiation.  He is able to  eat liquids and soft foods.  He is referred here for cancer surgery  after his chemotherapy and radiation.   PAST MEDICAL HISTORY:  He has also had left knee surgery.   MEDICATIONS:  As mentioned include aspirin, Ambien, Accupril for  hypertension, Prilosec, Carafate.   FAMILY HISTORY:  Positive for colon cancer and ovarian cancer.   SOCIAL HISTORY:  He used to work for [employer]  He quit  smoking just recently.  He smoked half pack of cigarettes a day and  drinks wine on home.   REVIEW OF SYSTEMS:  CARDIAC:  No angina or atrial fibrillation.  GENERAL:  He has had weight loss.  He is 166 pounds, 6 feet 1 inch.  PULMONARY:  No hemoptysis or cough.  GI:  See history of present illness.  GU:  No kidney disease, dysuria, or frequent urination.  VASCULAR:  No claudication, DVT, TIAs.  NEUROLOGIC:  No dizziness, headaches, blackouts, or seizures.  MUSCULOSKELETAL:  No arthritis.  PSYCHIATRIC:  No depression or nervousness.  EYE/ENT:  No changes in eyesight or hearing.  HEMATOLOGIC:  No problems with bleeding, clotting disorders, or anemia.   PHYSICAL EXAMINATION:  General:  He is a thin Caucasian male in no acute  distress.  Vital Signs:  His  blood pressure was 116/79, pulse 100,  respirations 16, sats were 97%.  Head, Eyes, Ears, Nose, and Throat:  Unremarkable.  Neck:  Supple without thyromegaly.  There is no  supraclavicular or axillary adenopathy.  Chest:  Clear to auscultation  and percussion.  Heart:  Regular sinus rhythm.  No murmurs.  Abdomen:  Soft.  No splenomegaly.  Extremities:  Pulses are 2+.  There is no  clubbing or edema.  Neurologic:  He is oriented x3.  He is sensory and  motor intact, and cranial nerves are intact.   ASSESSMENT AND PLAN:  Unfortunately, I think he has got a very advanced  cancer and we hope that he gets good response to the radiation and  chemotherapy.  If he comes to surgery, he will probably require IvorMelvyn Neth reconstruction with esophagogastrectomy and at least take 40% of  his esophagus.  There is a possibility that we could get by with a  transhiatal esophagectomy.  I explained the type of surgery that he  needs and went into somewhat the risks and side effects of the surgery.  I will see him again in 4 weeks and see how he is persisting at that  time and then we will schedule him for followup PET scan and cardiac  clearance  as well as pulmonary function tests after his next surgery.  I  appreciate the opportunity of seeing the patient.   Sincerely,   Ines Bloomer, M.D.  Electronically Signed   DPB/MEDQ  D:  07/15/2010  T:  07/16/2010  Job:  [id number]   cc:   Everardo All. Madilyn Fireman, M.D.

## 2011-02-03 NOTE — Letter (Signed)
August 12, 2010   Leighton Roach. [name], M.D.  [address]- [facility]  [place] [address]   Re:  [name], [name]              DOB:  1946-04-17   Dear Dr. [name]:   I saw the patient today and it looks like he has got an excellent result  from his radiation and chemotherapy.  He still has 2 more treatments of  radiation.  His weight is stable.  He is eating well and he has actually  gained some weight.  As I mentioned in my last letter he had such a  large tumor he may require an Ivor Lewis reconstruction hoping that this  is decreased and after we may be able to get by with a transhiatal  esophagectomy.  I plan to see him in the office in 3 weeks with a PET  scan.  We will get cardiac clearance from Dr. Anne Fu and get a pulmonary  function test with DLCO.   I appreciate the opportunity of seeing the patient.   Ines Bloomer, M.D.  Electronically Signed   DPB/MEDQ  D:  08/12/2010  T:  08/13/2010  Job:  [id number]   cc:   C. Duane Lope, MD  Maryln Gottron, M.D.

## 2011-02-03 NOTE — Letter (Signed)
September 03, 2010   Leighton Roach. [name], M.D.  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  So, I think we can  proceed with a transhiatal esophagogastrectomy and I have tentatively  scheduled this for January 4.  He will get a cardiac clearance from Dr.  Anne Fu next Monday.  His pulmonary function tests are satisfactory,  showed minimal obstructive disease with an FVC of 3.89 and FEV1 of 3.03  both 75% of predicted and a corrected DLCO of 78%.  I plan to admit him  on January 8 for IV antibiotics and hydration and proceed with his  surgery on January 9.  The risk of the procedures were explained to him  including anastomotic leak, laryngeal nerve palsy, pneumonia, myocardial  infarction and pulmonary embolus.   I appreciate the opportunity of seeing the patient.   Ines Bloomer, M.D.  Electronically Signed   DPB/MEDQ  D:  09/03/2010  T:  09/04/2010  Job:  [id number]   cc:   Miguel Aschoff, M.D.  Maryln Gottron, M.D.

## 2011-02-09 ENCOUNTER — Other Ambulatory Visit: Payer: Self-pay | Admitting: Thoracic Surgery

## 2011-02-09 DIAGNOSIS — C159 Malignant neoplasm of esophagus, unspecified: Secondary | ICD-10-CM

## 2011-02-10 ENCOUNTER — Ambulatory Visit
Admission: RE | Admit: 2011-02-10 | Discharge: 2011-02-10 | Disposition: A | Discharge status: Home / Self Care | Payer: Medicare Other | Source: Ambulatory Visit | Attending: Thoracic Surgery | Admitting: Thoracic Surgery

## 2011-02-10 ENCOUNTER — Encounter (INDEPENDENT_AMBULATORY_CARE_PROVIDER_SITE_OTHER): Payer: Medicare Other | Admitting: Thoracic Surgery

## 2011-02-10 DIAGNOSIS — C159 Malignant neoplasm of esophagus, unspecified: Secondary | ICD-10-CM

## 2011-02-11 NOTE — Assessment & Plan Note (Unsigned)
OFFICE VISIT  [name], [name] DOB:  June 08, 1946                                        Feb 10, 2011 CHART #:  [id number]  HISTORY OF PRESENT ILLNESS:  The patient is status post transhiatal esophagectomy with pyloroplasty and jejunostomy on September 29, 2010.  He never required re-exploration of the left neck with wound debridement packing secondary to an anastomotic leak on October 08, 2010.  He was last seen in the office by Dr. Edwyna Shell on January 13, 2011.  At this time, the patient was following up with Dr. [name], planning to undergo injection of his vocal cords for diagnosis of left vocal cord paralysis. The patient did undergo this injection and stated he noticed improvement of his voice within 24 hours.  He states that is about 90% improved. The patient presents today for a 6-week followup visit.  He overall feels well.  He is exercising on the elliptical about a mile and half a day.  He denies any shortness of breath.  He is eating 2500 calories per day.  The patient does state that he is still having a dumping syndrome. He states he is going to the bathroom three or four times per day.  He is also experiencing cramping of his abdomen.  He does complain of weight loss.  He states he was 168 pounds last time he came to the office and he is currently 162.  The patient does have a followup visit with Dr. [name] in September.  PHYSICAL EXAMINATION:  Vital Signs:  Blood pressure is 122/76, pulse 96, respirations of 18, O2 sats 98% on room air.  Weight of 162. Respiratory:  Clear to auscultation bilaterally.  Cardiac:  Regular rate and rhythm.  Abdomen:  Positive bowel sounds, soft.  Incisions all healed well.  STUDIES:  The patient had a PA and lateral chest x-ray obtained today which shows a small right pleural effusion with right basilar scarring with no acute findings.  ASSESSMENT AND PLAN:  The patient was seen and evaluated by Dr. Edwyna Shell. The  patient is felt to be overall progressing well.  It is concerning that he is still having dumping syndrome several times per day with slight weight loss.  We will have the patient follow up with Dr. Dorena Cookey with GI for further medication recommendations.  The patient is to continue to progress as tolerated.  We will plan to see the patient back in July with a repeat CT scan.  The patient is instructed if he has any concerns or issues prior to his followup appointment he is contact us.  Sol Blazing, PA  KMD/MEDQ  D:  02/10/2011  T:  02/11/2011  Job:  [id number]

## 2011-02-17 NOTE — Op Note (Signed)
[name], [name]            ACCOUNT NO.:  [id number]  MEDICAL RECORD NO.:  [id number]           PATIENT TYPE:  LOCATION:                                 FACILITY:  PHYSICIAN:  Shakaria Raphael T. [name], M.D. DATE OF BIRTH:  08/03/1946  DATE OF PROCEDURE:  01/26/2011 DATE OF DISCHARGE:                              OPERATIVE REPORT   PREOPERATIVE DIAGNOSIS:  Left vocal cord paralysis.  POSTOPERATIVE DIAGNOSIS:  Left vocal cord paralysis.  PROCEDURE PERFORMED:  Microlaryngoscopy with the Radiesse vocal cord augmentation injection.  SURGEON:  Gloris Manchester. [name] Salines, MD  ANESTHESIA:  General orotracheal.  BLOOD LOSS:  None.  COMPLICATIONS:  None.  FINDINGS:  An atrophic left vocal cord with bowing and loss of volume into the deep vocal cord and ventricle.  No specific lesions.  PROCEDURE:  With the patient in a comfortable supine position, general orotracheal anesthesia was induced without difficulty.  At an appropriate level, the table was turned 90 degrees and the patient placed in a slight reverse Trendelenburg.  The head was supported in a flexed head extended position.  A rubber tooth guard was applied.  Taking care to protect lips, teeth, and endotracheal tube, the anterior commissure laryngoscope was introduced and passed into the pharynx, inserted under the epiglottis with good visualization of the glottis. It was suspended in the standard fashion with the findings as described above.  Cocaine 4% solution was applied on 1.5 x 1 inch cottonoids to the vocal cord surfaces for intraoperative hemostasis.  Several minutes were allowed for this to take effect.  The cocaine pledgets were removed.  Photographs were taken with a 0 degree nasal endoscope.  Beginning on the left side, using the extended length needle, Radiesse solution was instilled into the deep lateral vocal cord in multiple sites up and down the extent of the cord to the level of the vocal process of the  arytenoid.  The atrophy visible in the ventricle was filled with the filler.  There was no obvious bulging of the inferior surface of the vocal cord, but the bowed contour of the cord itself was improved.  A small amount of the same material was applied into the bulk of the right vocal cord to assist and making good approximation.  Returning to the left side, a small additional knot was applied more anteriorly taking care to avoid the anterior commissure.  At this point, the vocal cords both had better volume and straight configuration of the free edge.  Hemostasis was spontaneous.  Photographs were taken once again.  At this point, the laryngoscope was unsuspended and carefully removed.  The rubber tooth guard was also removed.  The dental status was intact.  The patient was returned to Anesthesia, awakened, extubated, and transferred to recovery in stable condition.  COMMENT:  A 65 year old white male with a vocal cord paralysis status post esophagectomy several months back and a breathy and diplophonic voice and poorly functional cough were the indications for today's procedure.  Anticipate routine postoperative recovery with attention to analgesia and cough suppression.  Given low anticipated risk of postanesthetic or postsurgical complications, feel an outpatient venue is appropriate.  Gloris Manchester. [name], M.D.     KTW/MEDQ  D:  01/26/2011  T:  01/26/2011  Job:  [id number]  cc:   Ladene Artist, M.D. Ines Bloomer, M.D.  Electronically Signed by [name] M.D. on 02/17/2011 10:46:34 AM

## 2011-03-12 ENCOUNTER — Other Ambulatory Visit: Payer: Self-pay | Admitting: Thoracic Surgery

## 2011-03-12 DIAGNOSIS — C159 Malignant neoplasm of esophagus, unspecified: Secondary | ICD-10-CM

## 2011-04-14 ENCOUNTER — Ambulatory Visit (INDEPENDENT_AMBULATORY_CARE_PROVIDER_SITE_OTHER): Payer: Medicare Other | Admitting: Thoracic Surgery

## 2011-04-14 ENCOUNTER — Ambulatory Visit
Admission: RE | Admit: 2011-04-14 | Discharge: 2011-04-14 | Disposition: A | Discharge status: Home / Self Care | Payer: Medicare Other | Source: Ambulatory Visit | Attending: Thoracic Surgery | Admitting: Thoracic Surgery

## 2011-04-14 DIAGNOSIS — C159 Malignant neoplasm of esophagus, unspecified: Secondary | ICD-10-CM

## 2011-04-14 MED ORDER — IOHEXOL 300 MG/ML  SOLN
100.0000 mL | Freq: Once | INTRAMUSCULAR | Status: AC | PRN
  Administered 2011-04-14: 100 mL via INTRAVENOUS

## 2011-04-14 NOTE — Letter (Signed)
April 14, 2011  Dr. Tenny Craw  Re:  CHOU, BUSLER              DOB:  1945-11-20  Dear Dr. Tenny Craw:  The patient came today.  He is still having some hoarseness and he is being treated for reflux for that and his CT scan today did show some delayed emptying in his stomach.  He is on Nexium twice a day.  He also continues to have some intermittent diarrhea after eating and this is probably secondary to dumping. So he is got both some delayed emptying of the stomach as well as once he closes his pyloroplasty, he has some dumping.  __________ for that and that has helped.  He is continuing on Lomotil and hydrocodone also for that, the 5/500.  His weight is up to 167.  His neck incision had a small pyogenic granuloma, but that is improved.  His blood pressure is 147/77, pulse 94, respirations 16.  I will continue to follow him and I will see him back again in 6 weeks and 3 months with a chest x-ray, and he will see me before then if he has any further problems.  Ines Bloomer, M.D. Electronically Signed  DPB/MEDQ  D:  04/14/2011  T:  04/14/2011  Job:  [id number]  cc:   Everardo All. Madilyn Fireman, M.D. Gloris Manchester. [name], M.D.

## 2011-05-06 IMAGING — RF DG ESOPHAGUS
9 of 10 series · 19 of 24 positions shown · non-contrast
Comparison: Postop water soluble contrast swallow of 10/07/2010

CLINICAL DATA: History of esophagogastrectomy and prior anastomotic
leak

ESOPHOGRAM/BARIUM SWALLOW
TECHNIQUE: Single contrast examination was performed using thin
barium and water soluble contrast.
Fluoroscopy time:  1.7 minutes.

[Series 1: run · 3 of 18 slices shown (1 of 9)]
[im 1/18]
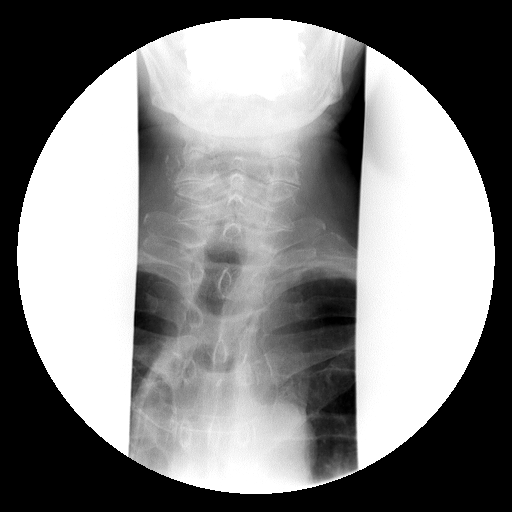
[im 6/18]
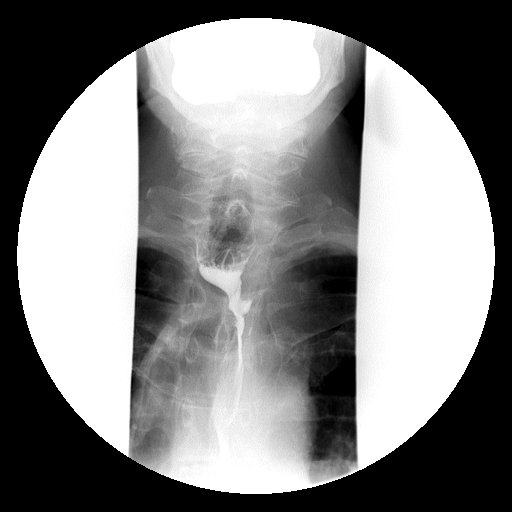
[im 18/18]
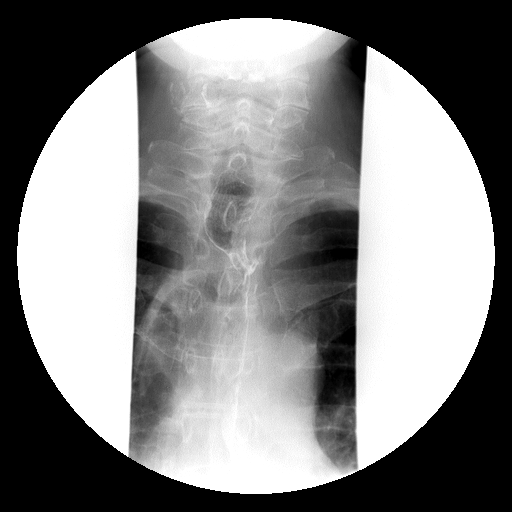

[Series 2: run · 1 of 7 slices shown (2 of 9)]
[im 1/7]
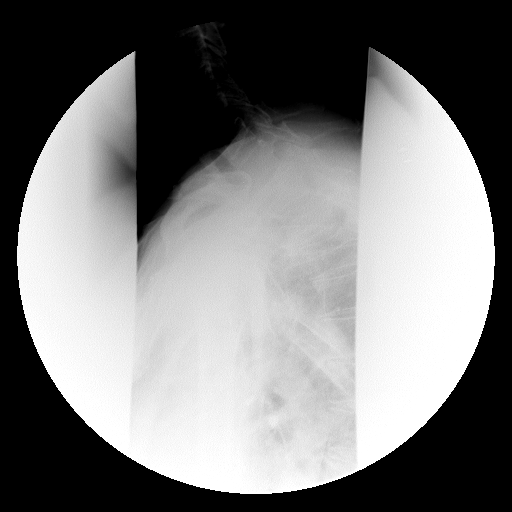

[Series 4: run · 3 of 18 slices shown (3 of 9)]
[im 1/18]
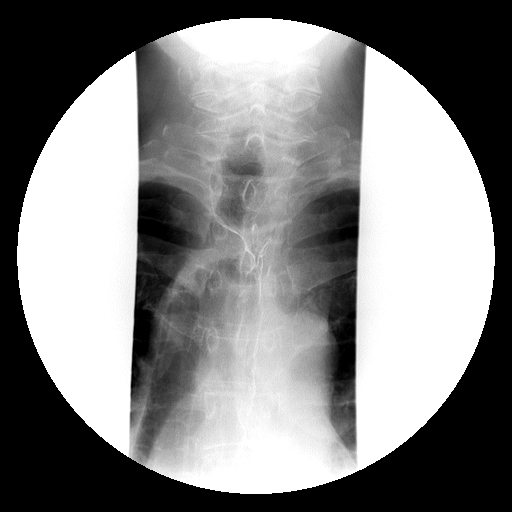
[im 6/18]
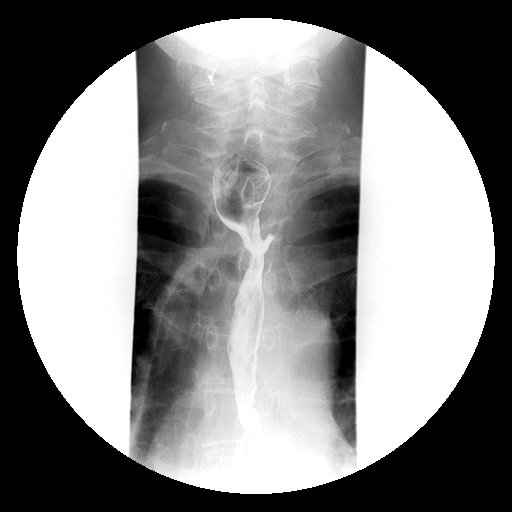
[im 18/18]
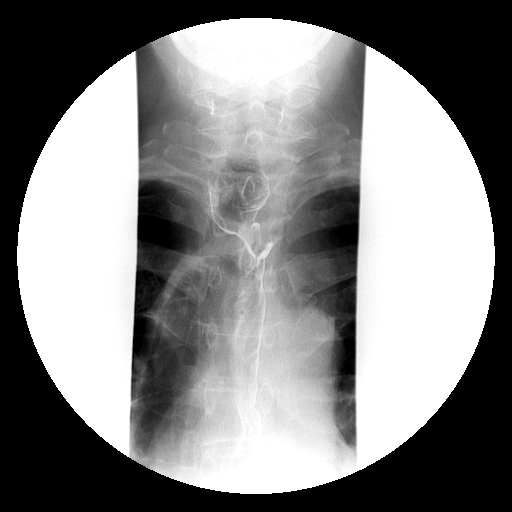

[Series 6: run · 3 of 14 slices shown (4 of 9)]
[im 1/14]
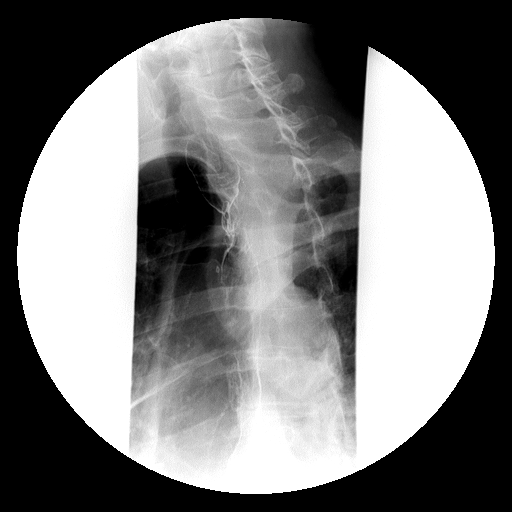
[im 5/14]
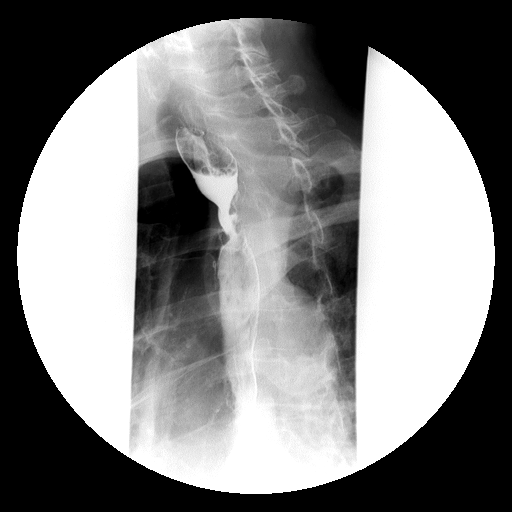
[im 14/14]
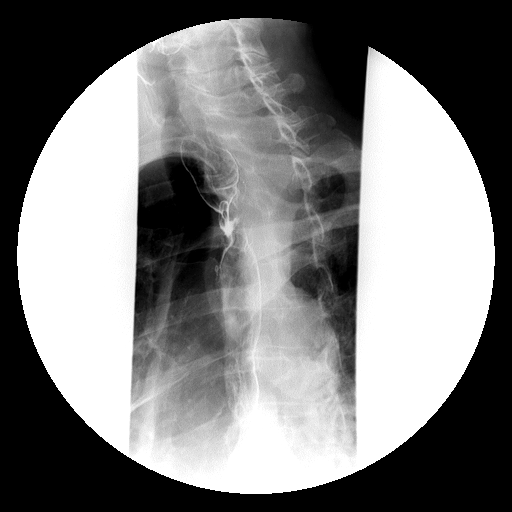

[Series 7: run · 2 of 6 slices shown (5 of 9)]
[im 1/6]
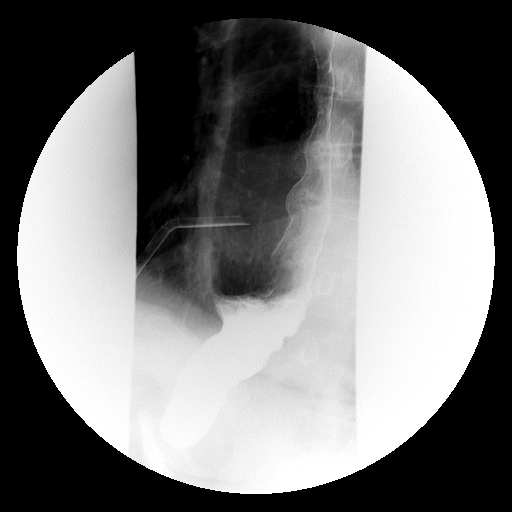
[im 6/6]
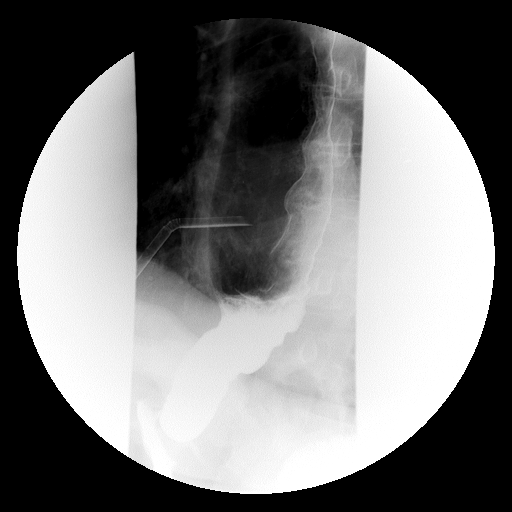

[Series 8: run · 1 of 1 slices shown (6 of 9)]
[im 1/1]
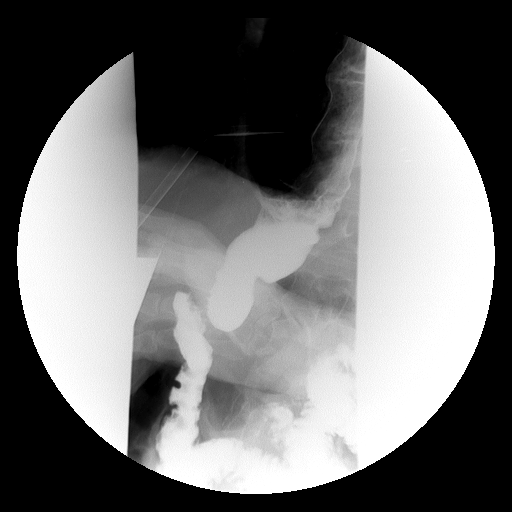

[Series 10: run · 1 of 1 slices shown (7 of 9)]
[im 1/1]
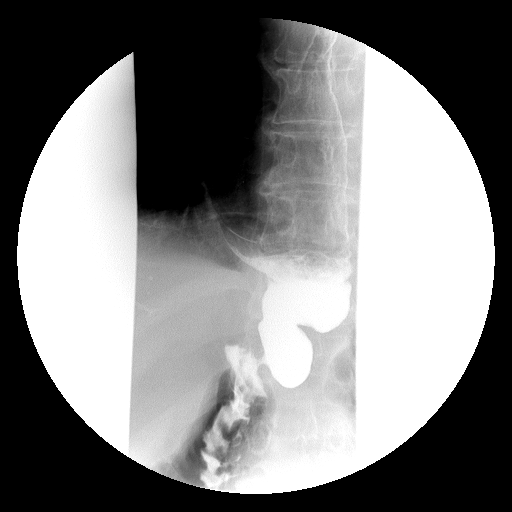

[Series 11: run · 3 of 16 slices shown (8 of 9)]
[im 1/16]
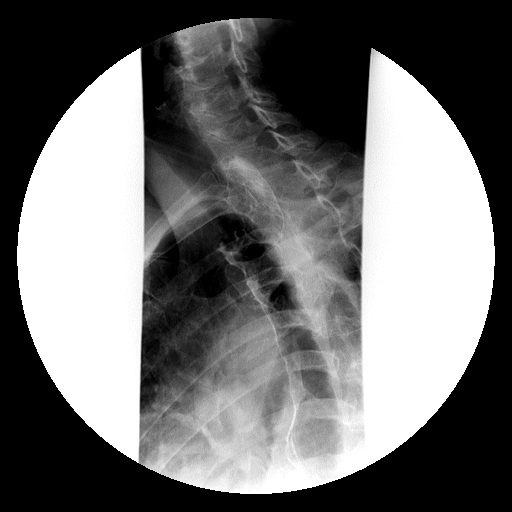
[im 6/16]
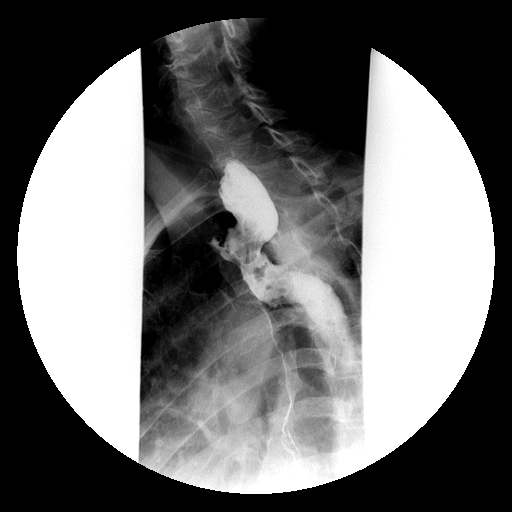
[im 11/16]
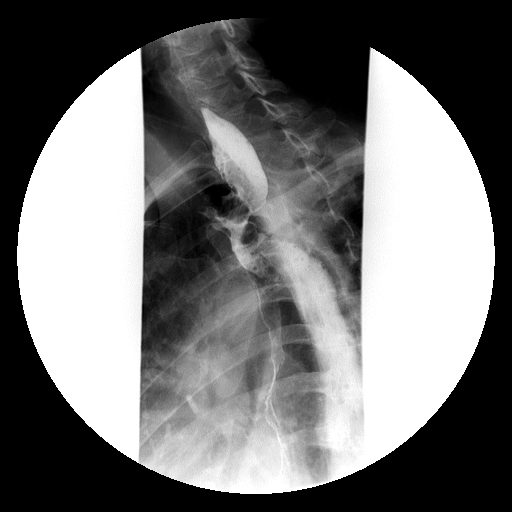

[Series 13: run · 2 of 7 slices shown (9 of 9)]
[im 1/7]
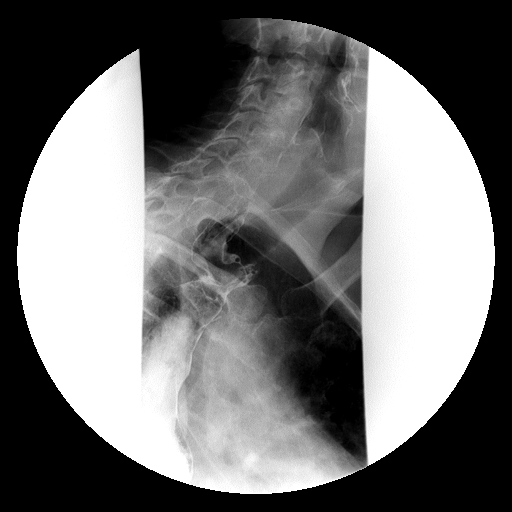
[im 7/7]
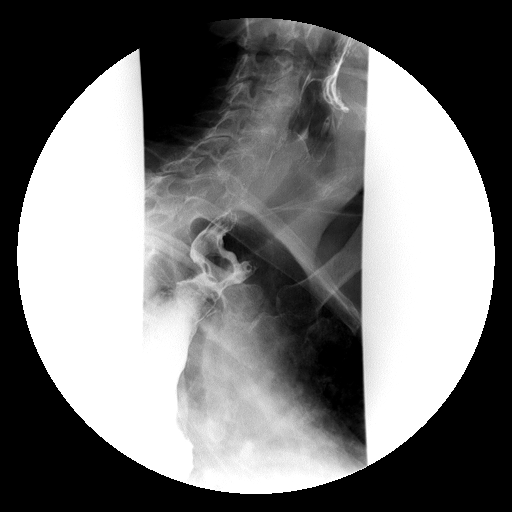

[19 of 24 positions shown; findings below may reference images not displayed]

FINDINGS: Initially this study was begun with water soluble
contrast in the erect position.  At the anastomotic site of the
upper thoracic esophagus with the stomach, there is a narrowing
present with a small outpouching.  However no extravasation of
contrast was identified.  Therefore barium was utilized.  Rapid
sequence spot films again show some narrowing at the anastomotic
site with an outpouching a extending toward the left lung apex.
This outpouching does not extend more distally, and probably
represents the area of prior leak by history with healing. The
esophagogastrectomy otherwise is unremarkable.  No obstruction to
the passage of contrast through the diaphragmatic hiatus is seen. .
IMPRESSION: There is some narrowing at the anastomoses, but no leak is seen.

## 2011-06-11 ENCOUNTER — Encounter (HOSPITAL_BASED_OUTPATIENT_CLINIC_OR_DEPARTMENT_OTHER): Payer: Medicare Other | Admitting: Oncology

## 2011-06-11 DIAGNOSIS — C155 Malignant neoplasm of lower third of esophagus: Secondary | ICD-10-CM

## 2011-07-06 ENCOUNTER — Ambulatory Visit (HOSPITAL_BASED_OUTPATIENT_CLINIC_OR_DEPARTMENT_OTHER)
Admission: RE | Admit: 2011-07-06 | Discharge: 2011-07-06 | Disposition: A | Discharge status: Home / Self Care | Payer: Medicare Other | Source: Ambulatory Visit | Attending: Otolaryngology | Admitting: Otolaryngology

## 2011-07-06 DIAGNOSIS — Z01812 Encounter for preprocedural laboratory examination: Secondary | ICD-10-CM | POA: Insufficient documentation

## 2011-07-06 DIAGNOSIS — J3801 Paralysis of vocal cords and larynx, unilateral: Secondary | ICD-10-CM | POA: Insufficient documentation

## 2011-07-06 DIAGNOSIS — K219 Gastro-esophageal reflux disease without esophagitis: Secondary | ICD-10-CM | POA: Insufficient documentation

## 2011-07-06 DIAGNOSIS — H9190 Unspecified hearing loss, unspecified ear: Secondary | ICD-10-CM | POA: Insufficient documentation

## 2011-07-06 DIAGNOSIS — Z8501 Personal history of malignant neoplasm of esophagus: Secondary | ICD-10-CM | POA: Insufficient documentation

## 2011-07-06 LAB — POCT HEMOGLOBIN-HEMACUE: Hemoglobin: 13 g/dL (ref 13.0–17.0)

## 2011-07-09 ENCOUNTER — Other Ambulatory Visit: Payer: Self-pay | Admitting: Thoracic Surgery

## 2011-07-09 DIAGNOSIS — C159 Malignant neoplasm of esophagus, unspecified: Secondary | ICD-10-CM

## 2011-07-09 NOTE — Op Note (Signed)
[name], [name]            ACCOUNT NO.:  [id number]  MEDICAL RECORD NO.:  [id number]  LOCATION:                                 FACILITY:  PHYSICIAN:  Zola Button T. [name], M.D. DATE OF BIRTH:  Jun 29, 1946  DATE OF PROCEDURE:  07/06/2011 DATE OF DISCHARGE:                              OPERATIVE REPORT   PREOPERATIVE DIAGNOSIS:  Left vocal cord paralysis status post esophagectomy.  POSTOPERATIVE DIAGNOSIS:  Left vocal cord paralysis status post esophagectomy.  PROCEDURE PERFORMED:  Bilateral vocal cord augmentation/micro direct laryngoscopy.  SURGEON:  Gloris Manchester. [name], MD  ANESTHESIA:  General orotracheal.  BLOOD LOSS:  None.  COMPLICATIONS:  None.  FINDINGS:  Atrophic lateralized left vocal cord.  Slight bowing of the right vocal cord.  PROCEDURE:  With the patient in a comfortable supine position, general orotracheal anesthesia was induced without difficulty.  At an appropriate level, the table was turned 90 degrees away from anesthesia and the patient placed in a slight reverse Trendelenburg.  A head pillow was used to support the neck in the anterior flexed position.  A rubber tooth guard was applied to the upper dentition. Taking care to protect lips, teeth, and endotracheal tube, the anterior commissure laryngoscope was introduced and carefully passed into the glottis.  Good visualization of both vocal cords was accomplished.  The laryngoscope was suspended in the standard fashion.  Photographs were taken using a 0 degree rod telescope.  Radius suspension was introduced and placed into the deep vocalis muscle on the left side in several locations with some infiltration into the tissues to improve the bulk of the vocal cord.  There was some bulging of the inferior surface of the cord.  Small needle punctures were made and some of this material was expressed out.  Working more deeply into the body of the vocalis muscle and down towards the thyroid  lamina, additional infiltration was accomplished.  The same technique was used to apply a small amount of the same material into the right vocal cord to achieve a more straight and in fact slightly convex configuration.  Returning to the left vocal cord, additional material was applied deeply to match the contour of the right cord with additional convexity to improve approximation of the 2 cords.  Both vocal cords were massaged with the side of the needle to achieve a smooth and regular configuration.  A 0.9 mL of radius was used total.  Hemostasis was observed and there was no obvious intraoperative swelling.  A 1:1000 epinephrine was applied on a cottonoid to the surfaces of the vocal cords for hemostasis.  This was left in place for several minutes and then removed.  Additional photographs were taken with the rod telescope and this was removed.  The configuration of the course was felt to be adequate.  The laryngoscope was unsuspended and carefully removed.  There was no significant bleeding or additional foreign material in the airway.  The rubber tooth guard was removed and the dental status was intact.  The patient was returned to Anesthesia, awakened, extubated, and transferred to recovery in stable condition.  COMMENT:  A 65 year old white male status post esophagectomy with left vocal cord paralysis as a  complication, has undergone a previous radius augmentation with laryngoplasty with partial results and relatively short duration.  A more liberal application of the radius was performed today in the hopes of achieving improved result and longer duration. Anticipate a routine postoperative recovery with attention to ice, elevation, anti-reflex measures, and avoidance of heavy coughing or clearing.  Given low anticipated risk of postanesthetic or postsurgical complications, I feel an outpatient venue is appropriate.     Gloris Manchester. [name], M.D.     KTW/MEDQ  D:   07/06/2011  T:  07/06/2011  Job:  [id number]  cc:   Ines Bloomer, M.D. Ladene Artist, M.D.  Electronically Signed by [name] M.D. on 07/09/2011 11:59:36 AM

## 2011-07-14 DIAGNOSIS — C159 Malignant neoplasm of esophagus, unspecified: Secondary | ICD-10-CM

## 2011-07-15 ENCOUNTER — Ambulatory Visit (INDEPENDENT_AMBULATORY_CARE_PROVIDER_SITE_OTHER): Payer: Medicare Other | Admitting: Thoracic Surgery

## 2011-07-15 ENCOUNTER — Ambulatory Visit
Admission: RE | Admit: 2011-07-15 | Discharge: 2011-07-15 | Disposition: A | Discharge status: Home / Self Care | Payer: Medicare Other | Source: Ambulatory Visit | Attending: Thoracic Surgery | Admitting: Thoracic Surgery

## 2011-07-15 ENCOUNTER — Encounter: Payer: Self-pay | Admitting: Thoracic Surgery

## 2011-07-15 VITALS — BP 112/73 | HR 81 | Resp 14 | Ht 73.0 in | Wt 162.0 lb

## 2011-07-15 DIAGNOSIS — Z8501 Personal history of malignant neoplasm of esophagus: Secondary | ICD-10-CM

## 2011-07-15 DIAGNOSIS — C159 Malignant neoplasm of esophagus, unspecified: Secondary | ICD-10-CM

## 2011-07-15 NOTE — Progress Notes (Signed)
HPI patient returns for followup. Chest x-ray shows some dilatation of the stomach. He is eating well and maintaining his weight. No more problems as well as diarrhea. He has had some mild dumping when he in just a lot of sugar. A Dr.Wolicki reinjected his vocal cord I will see him again in February with a CT  Current Outpatient Prescriptions  Medication Sig Dispense Refill  . diphenoxylate-atropine (LOMOTIL) 2.5-0.025 MG per tablet Take 1 tablet by mouth 4 (four) times daily as needed.        [name] aspirin 81 MG tablet Take 81 mg by mouth daily.        [name] esomeprazole (NEXIUM) 40 MG capsule Take 40 mg by mouth daily before breakfast.        . HYDROcodone-acetaminophen (VICODIN) 5-500 MG per tablet Take 1 tablet by mouth every 6 (six) hours as needed.           Review of Systems: Unchanged   Physical Exam  Eyes: Pupils are equal, round, and reactive to light.  Neck: Normal range of motion. Neck supple. No tracheal deviation present. No thyromegaly present.  Cardiovascular: Normal rate, regular rhythm and normal heart sounds.   Pulmonary/Chest: Effort normal and breath sounds normal. No respiratory distress.  Lymphadenopathy:    He has no cervical adenopathy.     Diagnostic Tests: Chest x-ray shows a gastric pull-through with mild dilatation of the stomach   Impression: Status post esophagogastrectomy for adenocarcinoma of the esophagus   Plan: Return in February with CT scan

## 2011-07-21 ENCOUNTER — Other Ambulatory Visit: Payer: Self-pay | Admitting: *Deleted

## 2011-07-21 ENCOUNTER — Encounter: Payer: Self-pay | Admitting: *Deleted

## 2011-07-21 DIAGNOSIS — R52 Pain, unspecified: Secondary | ICD-10-CM

## 2011-07-21 MED ORDER — HYDROCODONE-ACETAMINOPHEN 5-500 MG PO TABS: 1.0000 | ORAL_TABLET | Freq: Four times a day (QID) | ORAL | Status: DC | PRN

## 2011-09-01 ENCOUNTER — Other Ambulatory Visit: Payer: Self-pay

## 2011-09-01 DIAGNOSIS — C159 Malignant neoplasm of esophagus, unspecified: Secondary | ICD-10-CM

## 2011-09-01 DIAGNOSIS — R52 Pain, unspecified: Secondary | ICD-10-CM

## 2011-09-01 MED ORDER — HYDROCODONE-ACETAMINOPHEN 5-500 MG PO TABS: 1.0000 | ORAL_TABLET | Freq: Four times a day (QID) | ORAL | Status: DC | PRN

## 2011-10-01 ENCOUNTER — Other Ambulatory Visit: Payer: Self-pay | Admitting: Thoracic Surgery

## 2011-10-01 DIAGNOSIS — C159 Malignant neoplasm of esophagus, unspecified: Secondary | ICD-10-CM

## 2011-10-24 ENCOUNTER — Telehealth: Payer: Self-pay | Admitting: Oncology

## 2011-10-24 NOTE — Telephone Encounter (Signed)
Talked to pt's wife, gave her appt on 12/10/11, with ML

## 2011-10-27 ENCOUNTER — Other Ambulatory Visit: Payer: Self-pay | Admitting: *Deleted

## 2011-10-27 DIAGNOSIS — C159 Malignant neoplasm of esophagus, unspecified: Secondary | ICD-10-CM

## 2011-10-27 DIAGNOSIS — R197 Diarrhea, unspecified: Secondary | ICD-10-CM

## 2011-10-27 DIAGNOSIS — R52 Pain, unspecified: Secondary | ICD-10-CM

## 2011-10-27 MED ORDER — HYDROCODONE-ACETAMINOPHEN 5-500 MG PO TABS: 1.0000 | ORAL_TABLET | Freq: Four times a day (QID) | ORAL | Status: DC | PRN

## 2011-10-27 MED ORDER — DIPHENOXYLATE-ATROPINE 2.5-0.025 MG PO TABS: 1.0000 | ORAL_TABLET | Freq: Four times a day (QID) | ORAL | Status: DC | PRN

## 2011-11-11 ENCOUNTER — Other Ambulatory Visit: Payer: Self-pay | Admitting: *Deleted

## 2011-11-11 DIAGNOSIS — R52 Pain, unspecified: Secondary | ICD-10-CM

## 2011-11-11 DIAGNOSIS — C159 Malignant neoplasm of esophagus, unspecified: Secondary | ICD-10-CM

## 2011-11-11 MED ORDER — HYDROCODONE-ACETAMINOPHEN 5-500 MG PO TABS: 1.0000 | ORAL_TABLET | Freq: Four times a day (QID) | ORAL | Status: DC | PRN

## 2011-11-12 ENCOUNTER — Other Ambulatory Visit: Payer: Self-pay | Admitting: Thoracic Surgery

## 2011-11-12 LAB — CREATININE, SERUM: Creat: 0.97 mg/dL (ref 0.50–1.35)

## 2011-11-12 LAB — BUN: BUN: 14 mg/dL (ref 6–23)

## 2011-11-17 ENCOUNTER — Ambulatory Visit
Admission: RE | Admit: 2011-11-17 | Discharge: 2011-11-17 | Disposition: A | Discharge status: Home / Self Care | Payer: Medicare Other | Source: Ambulatory Visit | Attending: Thoracic Surgery | Admitting: Thoracic Surgery

## 2011-11-17 ENCOUNTER — Ambulatory Visit (INDEPENDENT_AMBULATORY_CARE_PROVIDER_SITE_OTHER): Payer: Medicare Other | Admitting: Thoracic Surgery

## 2011-11-17 ENCOUNTER — Encounter: Payer: Self-pay | Admitting: Thoracic Surgery

## 2011-11-17 VITALS — BP 123/70 | HR 98 | Resp 16 | Ht 73.0 in | Wt 171.0 lb

## 2011-11-17 DIAGNOSIS — Z09 Encounter for follow-up examination after completed treatment for conditions other than malignant neoplasm: Secondary | ICD-10-CM

## 2011-11-17 DIAGNOSIS — Z8501 Personal history of malignant neoplasm of esophagus: Secondary | ICD-10-CM

## 2011-11-17 DIAGNOSIS — C159 Malignant neoplasm of esophagus, unspecified: Secondary | ICD-10-CM

## 2011-11-17 MED ORDER — IOHEXOL 300 MG/ML  SOLN
100.0000 mL | Freq: Once | INTRAMUSCULAR | Status: AC | PRN
  Administered 2011-11-17: 100 mL via INTRAVENOUS

## 2011-11-17 NOTE — Progress Notes (Signed)
HPI the patient returns for followup today CT scan of the chest and abdomen showed no evidence of recurrence of his cancer. Still complains of hoarseness. His incisions are well-healed. His weight is stable. We will have him followed by his medical oncologist after his next visit. We will continue along the hydrocodone with meals as this helps with his diarrhea post prandial.He will stopthe Lomotil and continue the hydrocodone. Current Outpatient Prescriptions  Medication Sig Dispense Refill  . aspirin 81 MG tablet Take 81 mg by mouth every other day.       . diphenoxylate-atropine (LOMOTIL) 2.5-0.025 MG per tablet Take 1 tablet by mouth once.      [name] esomeprazole (NEXIUM) 40 MG capsule Take 40 mg by mouth 2 (two) times daily.       [name] HYDROcodone-acetaminophen (VICODIN) 5-500 MG per tablet Take 1 tablet by mouth every 6 (six) hours as needed.  60 tablet  0  . hyoscyamine (CYSTOSPAZ) 0.15 MG tablet Take 0.15 mg by mouth 1 day or 1 dose.       No current facility-administered medications for this visit.   Facility-Administered Medications Ordered in Other Visits  Medication Dose Route Frequency Provider Last Rate Last Dose  . iohexol (OMNIPAQUE) 300 MG/ML solution 100 mL  100 mL Intravenous Once PRN Medication Radiologist, MD   100 mL at 11/17/11 1249     Review of Systems: Unchanged still has hoarseness probably secondary to recurrent nerve palsy he understands that this is probably going to be permanent he is being followed by Dr. [name] he has recently started gaining weight.  Physical Exam incisions are well-healed chest is clear to auscultation percussion   Diagnostic Tests: CT scan of the chest and abdomen shows no evidence of recurrence of his esophageal cancer   Impression: Esophageal cancer stage IIIa status post transhiatal esophagectomy with radiation and chemotherapy   Plan: Return months with chest X.ray

## 2011-11-25 ENCOUNTER — Other Ambulatory Visit: Payer: Self-pay | Admitting: *Deleted

## 2011-11-25 DIAGNOSIS — C159 Malignant neoplasm of esophagus, unspecified: Secondary | ICD-10-CM

## 2011-11-25 DIAGNOSIS — R52 Pain, unspecified: Secondary | ICD-10-CM

## 2011-11-25 MED ORDER — HYDROCODONE-ACETAMINOPHEN 5-500 MG PO TABS: 1.0000 | ORAL_TABLET | Freq: Four times a day (QID) | ORAL | Status: DC | PRN

## 2011-12-09 ENCOUNTER — Other Ambulatory Visit: Payer: Self-pay | Admitting: *Deleted

## 2011-12-09 DIAGNOSIS — C159 Malignant neoplasm of esophagus, unspecified: Secondary | ICD-10-CM

## 2011-12-09 DIAGNOSIS — R52 Pain, unspecified: Secondary | ICD-10-CM

## 2011-12-09 MED ORDER — HYDROCODONE-ACETAMINOPHEN 5-500 MG PO TABS: 1.0000 | ORAL_TABLET | Freq: Four times a day (QID) | ORAL | Status: DC | PRN

## 2011-12-10 ENCOUNTER — Ambulatory Visit (HOSPITAL_BASED_OUTPATIENT_CLINIC_OR_DEPARTMENT_OTHER): Payer: Medicare Other | Admitting: Nurse Practitioner

## 2011-12-10 ENCOUNTER — Telehealth: Payer: Self-pay | Admitting: Oncology

## 2011-12-10 VITALS — BP 108/67 | HR 76 | Temp 97.0°F | Ht 73.0 in | Wt 170.6 lb

## 2011-12-10 DIAGNOSIS — Z8546 Personal history of malignant neoplasm of prostate: Secondary | ICD-10-CM

## 2011-12-10 DIAGNOSIS — C160 Malignant neoplasm of cardia: Secondary | ICD-10-CM

## 2011-12-10 DIAGNOSIS — C159 Malignant neoplasm of esophagus, unspecified: Secondary | ICD-10-CM

## 2011-12-10 NOTE — Telephone Encounter (Signed)
appts made and printed for pt aom °

## 2011-12-10 NOTE — Progress Notes (Signed)
OFFICE PROGRESS NOTE  Interval history:  [name] returns as scheduled. He feels well. He has a good appetite. He is gaining weight. He is exercising on a regular basis. He denies dysphagia. No nausea or vomiting. He reports persistent hoarseness. He continues followup with Dr. [name]. He is taking a combination of Vicodin, Levsin and Nexium for control of diarrhea and stomach discomfort.   Objective: Blood pressure 108/67, pulse 76, temperature 97 F (36.1 C), temperature source Oral, height 6\' 1"  (1.854 m), weight 170 lb 9.6 oz (77.384 kg).  Oropharynx is without thrush or ulceration. No palpable cervical, supraclavicular or axillary lymph nodes. Lungs are clear. Regular cardiac rhythm. Abdomen is soft and nontender. No organomegaly. Extremities are without edema. Calves are soft and nontender.  Lab Results: Lab Results  Component Value Date   WBC 5.8 10/19/2010   HGB 13.0 07/06/2011   HCT 33.5* 10/19/2010   MCV 92.5 10/19/2010   PLT 389 10/19/2010    Chemistry:    Chemistry      Component Value Date/Time   NA 138 10/19/2010 0351   K 3.6 10/19/2010 0351   CL 103 10/19/2010 0351   CO2 26 10/19/2010 0351   BUN 14 11/12/2011 0000   CREATININE 0.97 11/12/2011 0000   CREATININE 0.88 10/19/2010 0351      Component Value Date/Time   CALCIUM 8.7 10/19/2010 0351   ALKPHOS 246* 10/19/2010 0351   AST 32 10/19/2010 0351   ALT 34 10/19/2010 0351   BILITOT 0.5 10/19/2010 0351       Studies/Results: Ct Chest W Contrast  11/17/2011  *RADIOLOGY REPORT*  Clinical Data:  Esophageal cancer.  CT CHEST AND ABDOMEN WITH CONTRAST  Technique:  Multidetector CT imaging of the chest and abdomen was performed following the standard protocol during bolus administration of intravenous contrast.  Contrast:  100 ml Omnipaque-300.  Comparison:  CT scan 04/14/2011.  CT CHEST  Findings:  The chest wall is unremarkable and stable.  No supraclavicular or axillary lymphadenopathy.  The bony thorax is intact.  The  heart is normal in size.  No pericardial effusion.  No mediastinal or hilar lymphadenopathy.  Stable surgical changes related to a gastric pull-through procedure an esophagectomy.  No complicating features are demonstrated.  Examination of the lung parenchyma demonstrates no acute pulmonary findings.  No worrisome pulmonary nodules or masses.  No pleural effusion or pleural thickening.  The  IMPRESSION: Stable CT appearance of the chest.  No findings for recurrent tumor or metastatic disease.  CT ABDOMEN  Findings:  The liver is normal.  No focal lesions or intrahepatic biliary dilatation.  Focal fatty change noted near the falciform ligament and the gallbladder fossa.  The common bile duct is normal in caliber.  The gallbladder is normal.  The pancreas demonstrates no significant abnormalities.  There is fairly significant atrophy of the body and tail which is stable.  The spleen is normal in size.  No focal lesions.  The adrenal glands and kidneys are normal.  The duodenum, small bowel and colon demonstrate no abnormalities. The appendix is normal.  No mesenteric or retroperitoneal masses or adenopathy.  Moderate atherosclerotic changes involving the aorta but no dissection and/or focal aneurysm.  The major branch vessels are patent.  The bony structures are unremarkable.  IMPRESSION: Stable CT appearance of the abdomen.  No findings for metastatic disease.  Original Report Authenticated By: P. Loralie Champagne, M.D.   Ct Abdomen W Contrast  11/17/2011  *RADIOLOGY REPORT*  Clinical Data:  Esophageal cancer.  CT CHEST AND ABDOMEN WITH CONTRAST  Technique:  Multidetector CT imaging of the chest and abdomen was performed following the standard protocol during bolus administration of intravenous contrast.  Contrast:  100 ml Omnipaque-300.  Comparison:  CT scan 04/14/2011.  CT CHEST  Findings:  The chest wall is unremarkable and stable.  No supraclavicular or axillary lymphadenopathy.  The bony thorax is intact.  The  heart is normal in size.  No pericardial effusion.  No mediastinal or hilar lymphadenopathy.  Stable surgical changes related to a gastric pull-through procedure an esophagectomy.  No complicating features are demonstrated.  Examination of the lung parenchyma demonstrates no acute pulmonary findings.  No worrisome pulmonary nodules or masses.  No pleural effusion or pleural thickening.  The  IMPRESSION: Stable CT appearance of the chest.  No findings for recurrent tumor or metastatic disease.  CT ABDOMEN  Findings:  The liver is normal.  No focal lesions or intrahepatic biliary dilatation.  Focal fatty change noted near the falciform ligament and the gallbladder fossa.  The common bile duct is normal in caliber.  The gallbladder is normal.  The pancreas demonstrates no significant abnormalities.  There is fairly significant atrophy of the body and tail which is stable.  The spleen is normal in size.  No focal lesions.  The adrenal glands and kidneys are normal.  The duodenum, small bowel and colon demonstrate no abnormalities. The appendix is normal.  No mesenteric or retroperitoneal masses or adenopathy.  Moderate atherosclerotic changes involving the aorta but no dissection and/or focal aneurysm.  The major branch vessels are patent.  The bony structures are unremarkable.  IMPRESSION: Stable CT appearance of the abdomen.  No findings for metastatic disease.  Original Report Authenticated By: P. Loralie Champagne, M.D.    Medications: I have reviewed the patient's current medications.  Assessment/Plan:  1. 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   a. Staging CT/PET scan evaluation showed evidence of local metastatic disease involving lymph nodes but no distant metastases. b. Status post weekly Taxol/carboplatin chemotherapy and concurrent radiation with the last week of chemotherapy given on 08/12/2010 and radiation  completed 08/18/2010.   c. Restaging PET scan December 2011 revealed an interval decrease in the size of metabolic activity of the primary esophagus tumor.  No evidence of hypermetabolic lymph node or distant metastases.   d. Esophagogastrectomy 09/29/2010 with no residual carcinoma identified. e. Restaging CT scans chest and abdomen 11/17/2011 negative for recurrent/metastatic disease. 2. History of stage T1C prostate cancer.  He continues an observation approach.  He is followed by Dr. Isabel Caprice. 3. History of hypertension. 4. History of dysphagia and weight loss secondary to the obstructing esophagus mass, resolved. 5. History of vesicular lesions at the right pubic area with the appearance of herpetic lesions.  He was treated with Valtrex. 6. Postoperative anastomotic leak, resolved. 7. Hoarseness, followed by Dr. [name].   8. Abdominal discomfort and diarrhea. Controlled with a combination of hydrocodone, Levsin and Nexium.  Disposition-[name] appears stable. He remains in remission from the esophagus cancer. He will return for a followup visit in 6 months. He will contact the office in the interim with any problems.  Plan reviewed with Dr. [name].  Lonna Cobb ANP/GNP-BC

## 2011-12-24 ENCOUNTER — Other Ambulatory Visit: Payer: Self-pay

## 2011-12-24 DIAGNOSIS — R52 Pain, unspecified: Secondary | ICD-10-CM

## 2011-12-24 DIAGNOSIS — C159 Malignant neoplasm of esophagus, unspecified: Secondary | ICD-10-CM

## 2011-12-24 MED ORDER — HYDROCODONE-ACETAMINOPHEN 5-500 MG PO TABS: 1.0000 | ORAL_TABLET | Freq: Four times a day (QID) | ORAL | Status: DC | PRN

## 2011-12-24 NOTE — Telephone Encounter (Signed)
RX refill called to [facility] #[phone] refills

## 2011-12-30 ENCOUNTER — Other Ambulatory Visit: Payer: Self-pay

## 2011-12-30 DIAGNOSIS — C159 Malignant neoplasm of esophagus, unspecified: Secondary | ICD-10-CM

## 2011-12-30 DIAGNOSIS — R1013 Epigastric pain: Secondary | ICD-10-CM

## 2011-12-30 DIAGNOSIS — R52 Pain, unspecified: Secondary | ICD-10-CM

## 2011-12-30 MED ORDER — HYDROCODONE-ACETAMINOPHEN 5-500 MG PO TABS: 1.0000 | ORAL_TABLET | Freq: Four times a day (QID) | ORAL | Status: DC | PRN

## 2011-12-30 NOTE — Telephone Encounter (Signed)
Will call in new RX of Hydrocodone 5/500 mg #60/1 for pick up on Friday 01/01/2012. Pt is aware

## 2012-01-14 ENCOUNTER — Other Ambulatory Visit: Payer: Self-pay

## 2012-01-14 DIAGNOSIS — R1013 Epigastric pain: Secondary | ICD-10-CM

## 2012-01-14 DIAGNOSIS — C159 Malignant neoplasm of esophagus, unspecified: Secondary | ICD-10-CM

## 2012-01-14 MED ORDER — HYDROCODONE-ACETAMINOPHEN 5-500 MG PO TABS: 1.0000 | ORAL_TABLET | Freq: Four times a day (QID) | ORAL | Status: DC | PRN

## 2012-02-05 ENCOUNTER — Encounter (HOSPITAL_BASED_OUTPATIENT_CLINIC_OR_DEPARTMENT_OTHER): Payer: Self-pay | Admitting: *Deleted

## 2012-02-05 NOTE — Progress Notes (Signed)
Has been here in past-last 10/12-no labs needed

## 2012-02-07 NOTE — H&P (Signed)
  [name], [name] 66 y.o., male [id number]     Chief Complaint: LEFT Vocal Cord Paralysis, Presbylaryngus  HPI: Six month recheck.  He did nicely with voice quality and projection for about three months, and since that time his voice has been faltering progressively.  Some days are better than others.  No breathing difficulty.  No pain.  No swallowing issues.  His most recent cancer surveillance is all negative.  PMH: Past Medical History  Diagnosis Date  . Esophageal cancer   . GERD (gastroesophageal reflux disease)     Surg Hx: Past Surgical History  Procedure Date  . Esophagectomy 10/02/2010    Dr.Burney  . Pyloroplasty 09/29/2010    Dr.Burney  . Jejunostomy 09/29/2010    Dr.Burney    FHx:  No family history on file. SocHx:  reports that he quit smoking about 2 years ago. He does not have any smokeless tobacco history on file. He reports that he does not drink alcohol or use illicit drugs.  ALLERGIES: No Known Allergies  No prescriptions prior to admission    No results found for this or any previous visit (from the past 48 hour(s)). No results found.  ROS:  No dyspnea or stridor.  No known cancer recurrence.  NO chest pain.  There were no vitals taken for this visit.  PHYSICAL EXAM: He is tall and thin.  Voice is breathy and occasionally diplophonic.  It is rather high pitched.  No breathing difficulty.  No coughing or throat clearing.  Ears are clear.  Anterior nose reveals a mild leftward septal deviation.  Oral cavity and pharynx clear.  I could not successfully examine the larynx with the mirror.  Neck with surgical scarring but no masses. Lungs clear to auscultation Heart with regular rate and rhythm and no murmurs Abdomen is scaphoid with scars and active bowel sounds Extremities of normal configuration Neurologic testing symmetric and grossly intact.   Using the flexible laryngoscope, both vocal cords are thin with some bowing.  The LEFT vocal cord is  paralyzed in a lateral position.  The airway is excellent.    No pooling in valleculae or pyriforms.     Assessment/Plan . Vocal cord paralysis   (478.30)  You are all set for a repeat vocal cord augmentation.  You may resume diet and activity as soon as comfortable afterwards.  STOP your aspirin today.  You are OK to use the Lortab liquid Hydrocodone for either pain or cough.  Recheck here with me 2-3 weeks afterwards.  [name] 02/07/2012, 11:03 PM

## 2012-02-08 ENCOUNTER — Encounter (HOSPITAL_BASED_OUTPATIENT_CLINIC_OR_DEPARTMENT_OTHER): Payer: Self-pay | Admitting: Anesthesiology

## 2012-02-08 ENCOUNTER — Encounter (HOSPITAL_BASED_OUTPATIENT_CLINIC_OR_DEPARTMENT_OTHER)
Admission: RE | Disposition: A | Discharge status: Home / Self Care | Payer: Self-pay | Source: Ambulatory Visit | Attending: Otolaryngology

## 2012-02-08 ENCOUNTER — Ambulatory Visit (HOSPITAL_BASED_OUTPATIENT_CLINIC_OR_DEPARTMENT_OTHER)
Admission: RE | Admit: 2012-02-08 | Discharge: 2012-02-08 | Disposition: A | Discharge status: Home / Self Care | Payer: Medicare Other | Source: Ambulatory Visit | Attending: Otolaryngology | Admitting: Otolaryngology

## 2012-02-08 ENCOUNTER — Ambulatory Visit (HOSPITAL_BASED_OUTPATIENT_CLINIC_OR_DEPARTMENT_OTHER): Payer: Medicare Other | Admitting: Anesthesiology

## 2012-02-08 DIAGNOSIS — K219 Gastro-esophageal reflux disease without esophagitis: Secondary | ICD-10-CM | POA: Insufficient documentation

## 2012-02-08 DIAGNOSIS — J3801 Paralysis of vocal cords and larynx, unilateral: Secondary | ICD-10-CM

## 2012-02-08 DIAGNOSIS — Z8501 Personal history of malignant neoplasm of esophagus: Secondary | ICD-10-CM | POA: Insufficient documentation

## 2012-02-08 DIAGNOSIS — Z9089 Acquired absence of other organs: Secondary | ICD-10-CM | POA: Insufficient documentation

## 2012-02-08 HISTORY — DX: Gastro-esophageal reflux disease without esophagitis: K21.9

## 2012-02-08 HISTORY — PX: DIRECT LARYNGOSCOPY WITH RADIAESSE INJECTION: SHX5328

## 2012-02-08 LAB — POCT HEMOGLOBIN-HEMACUE: Hemoglobin: 13.1 g/dL (ref 13.0–17.0)

## 2012-02-08 SURGERY — LARYNGOSCOPY, DIRECT, WITH CALCIUM HYDROXYLAPATITE MICROSPHERE INJECTION
Anesthesia: General | Site: Throat | Laterality: Bilateral | Wound class: Clean Contaminated

## 2012-02-08 MED ORDER — ONDANSETRON HCL 4 MG/2ML IJ SOLN: 4.0000 mg | INTRAMUSCULAR | Status: DC | PRN

## 2012-02-08 MED ORDER — HYDROCODONE-ACETAMINOPHEN 7.5-500 MG/15ML PO SOLN
10.0000 mL | ORAL | Status: DC | PRN
Start: 2012-02-08 — End: 2012-02-08

## 2012-02-08 MED ORDER — BACITRACIN ZINC 500 UNIT/GM EX OINT: 1.0000 "application " | TOPICAL_OINTMENT | Freq: Three times a day (TID) | CUTANEOUS | Status: DC

## 2012-02-08 MED ORDER — SUCCINYLCHOLINE CHLORIDE 20 MG/ML IJ SOLN
INTRAMUSCULAR | Status: DC | PRN
  Administered 2012-02-08: 100 mg via INTRAVENOUS

## 2012-02-08 MED ORDER — DEXAMETHASONE SODIUM PHOSPHATE 4 MG/ML IJ SOLN
INTRAMUSCULAR | Status: DC | PRN
  Administered 2012-02-08: 8 mg via INTRAVENOUS

## 2012-02-08 MED ORDER — ONDANSETRON HCL 4 MG/2ML IJ SOLN
INTRAMUSCULAR | Status: DC | PRN
  Administered 2012-02-08: 4 mg via INTRAVENOUS

## 2012-02-08 MED ORDER — FENTANYL CITRATE 0.05 MG/ML IJ SOLN
INTRAMUSCULAR | Status: DC | PRN
  Administered 2012-02-08: 50 ug via INTRAVENOUS

## 2012-02-08 MED ORDER — COCAINE HCL 4 % EX SOLN
CUTANEOUS | Status: DC | PRN
  Administered 2012-02-08: 4 mL via TOPICAL

## 2012-02-08 MED ORDER — EPHEDRINE SULFATE 50 MG/ML IJ SOLN
INTRAMUSCULAR | Status: DC | PRN
  Administered 2012-02-08: 5 mg via INTRAVENOUS

## 2012-02-08 MED ORDER — METOCLOPRAMIDE HCL 5 MG/ML IJ SOLN: 10.0000 mg | Freq: Once | INTRAMUSCULAR | Status: DC | PRN

## 2012-02-08 MED ORDER — METOCLOPRAMIDE HCL 5 MG/ML IJ SOLN
INTRAMUSCULAR | Status: DC | PRN
  Administered 2012-02-08: 10 mg via INTRAVENOUS

## 2012-02-08 MED ORDER — PROPOFOL 10 MG/ML IV EMUL
INTRAVENOUS | Status: DC | PRN
  Administered 2012-02-08: 200 mg via INTRAVENOUS

## 2012-02-08 MED ORDER — LIDOCAINE HCL (CARDIAC) 20 MG/ML IV SOLN
INTRAVENOUS | Status: DC | PRN
  Administered 2012-02-08: 50 mg via INTRAVENOUS

## 2012-02-08 MED ORDER — LACTATED RINGERS IV SOLN
INTRAVENOUS | Status: DC
  Administered 2012-02-08: 08:00:00 via INTRAVENOUS

## 2012-02-08 MED ORDER — SODIUM CHLORIDE 0.9 % IV SOLN: INTRAVENOUS | Status: DC

## 2012-02-08 MED ORDER — ONDANSETRON HCL 4 MG PO TABS: 4.0000 mg | ORAL_TABLET | ORAL | Status: DC | PRN

## 2012-02-08 MED ORDER — FENTANYL CITRATE 0.05 MG/ML IJ SOLN: 25.0000 ug | INTRAMUSCULAR | Status: DC | PRN

## 2012-02-08 MED ORDER — DEXAMETHASONE SODIUM PHOSPHATE 10 MG/ML IJ SOLN: 8.0000 mg | Freq: Once | INTRAMUSCULAR | Status: DC

## 2012-02-08 SURGICAL SUPPLY — 23 items
CANISTER SUCTION 1200CC (MISCELLANEOUS) ×2 IMPLANT
CLOTH BEACON ORANGE TIMEOUT ST (SAFETY) ×2 IMPLANT
GAUZE SPONGE 4X4 12PLY STRL LF (GAUZE/BANDAGES/DRESSINGS) IMPLANT
GLOVE BIO SURGEON STRL SZ 6.5 (GLOVE) ×1 IMPLANT
GLOVE ECLIPSE 8.0 STRL XLNG CF (GLOVE) ×2 IMPLANT
GOWN PREVENTION PLUS XLARGE (GOWN DISPOSABLE) ×1 IMPLANT
GOWN PREVENTION PLUS XXLARGE (GOWN DISPOSABLE) ×1 IMPLANT
GUARD TEETH (MISCELLANEOUS) ×1 IMPLANT
KIT PROLARN PLUS GEL W/NDL (Prosthesis and Implant ENT) ×1 IMPLANT
MARKER SKIN DUAL TIP RULER LAB (MISCELLANEOUS) IMPLANT
NDL SPNL 22GX7 QUINCKE BK (NEEDLE) IMPLANT
NDL TRANS ORAL INJECTION (NEEDLE) IMPLANT
NEEDLE SPNL 22GX7 QUINCKE BK (NEEDLE) IMPLANT
NEEDLE TRANS ORAL INJECTION (NEEDLE) ×2 IMPLANT
NS IRRIG 1000ML POUR BTL (IV SOLUTION) ×1 IMPLANT
PATTIES SURGICAL .5 X3 (DISPOSABLE) ×1 IMPLANT
SHEET MEDIUM DRAPE 40X70 STRL (DRAPES) ×2 IMPLANT
SLEEVE SCD COMPRESS KNEE MED (MISCELLANEOUS) ×2 IMPLANT
SOLUTION BUTLER CLEAR DIP (MISCELLANEOUS) ×2 IMPLANT
TOWEL OR 17X24 6PK STRL BLUE (TOWEL DISPOSABLE) ×1 IMPLANT
TOWEL OR NON WOVEN STRL DISP B (DISPOSABLE) ×1 IMPLANT
TUBE CONNECTING 20X1/4 (TUBING) ×2 IMPLANT
WATER STERILE IRR 1000ML POUR (IV SOLUTION) ×2 IMPLANT

## 2012-02-08 NOTE — Anesthesia Postprocedure Evaluation (Signed)
Anesthesia Post Note  Patient: [name]  Procedure(s) Performed: Procedure(s) (LRB): DIRECT LARYNGOSCOPY WITH RADIAESSE INJECTION (Bilateral)  Anesthesia type: General  Patient location: PACU  Post pain: Pain level controlled  Post assessment: Patient's Cardiovascular Status Stable  Last Vitals:  Filed Vitals:   02/08/12 1045  BP: 125/62  Pulse: 74  Temp:   Resp: 12    Post vital signs: Reviewed and stable  Level of consciousness: alert  Complications: No apparent anesthesia complications

## 2012-02-08 NOTE — Transfer of Care (Signed)
Immediate Anesthesia Transfer of Care Note  Patient: [name]  Procedure(s) Performed: Procedure(s) (LRB): DIRECT LARYNGOSCOPY WITH RADIAESSE INJECTION (Bilateral)  Patient Location: PACU  Anesthesia Type: General  Level of Consciousness: awake, alert  and oriented  Airway & Oxygen Therapy: Patient Spontanous Breathing and Patient connected to face mask oxygen  Post-op Assessment: Report given to PACU RN and Post -op Vital signs reviewed and stable  Post vital signs: Reviewed and stable  Complications: No apparent anesthesia complications

## 2012-02-08 NOTE — Anesthesia Preprocedure Evaluation (Signed)
Anesthesia Evaluation  Patient identified by MRN, date of birth, ID band Patient awake    Reviewed: Allergy & Precautions, H&P , NPO status , Patient's Chart, lab work & pertinent test results, reviewed documented beta blocker date and time   Airway Mallampati: II TM Distance: >3 FB Neck ROM: full    Dental   Pulmonary neg pulmonary ROS,          Cardiovascular negative cardio ROS      Neuro/Psych negative neurological ROS  negative psych ROS   GI/Hepatic Neg liver ROS, GERD-  Medicated and Controlled,  Endo/Other  negative endocrine ROS  Renal/GU negative Renal ROS  negative genitourinary   Musculoskeletal   Abdominal   Peds  Hematology negative hematology ROS (+)   Anesthesia Other Findings See surgeon's H&P   Reproductive/Obstetrics negative OB ROS                           Anesthesia Physical Anesthesia Plan  ASA: II  Anesthesia Plan: General   Post-op Pain Management:    Induction: Intravenous  Airway Management Planned: Oral ETT  Additional Equipment:   Intra-op Plan:   Post-operative Plan:   Informed Consent: I have reviewed the patients History and Physical, chart, labs and discussed the procedure including the risks, benefits and alternatives for the proposed anesthesia with the patient or authorized representative who has indicated his/her understanding and acceptance.   Dental Advisory Given and Dental advisory given  Plan Discussed with: CRNA and Surgeon  Anesthesia Plan Comments:         Anesthesia Quick Evaluation

## 2012-02-08 NOTE — Interval H&P Note (Signed)
History and Physical Interval Note:  02/08/2012 9:05 AM  [name]  has presented today for surgery, with the diagnosis of left vocal cord paralysis  The various methods of treatment have been discussed with the patient and family. After consideration of risks, benefits and other options for treatment, the patient has consented to  Procedure(s) (LRB): DIRECT LARYNGOSCOPY WITH RADIAESSE INJECTION (Bilateral) as a surgical intervention .  The patients' history has been re-reviewed, patient re-examined, no change in status, stable for surgery.  I have re-reviewed the patients' chart and labs.  Questions were answered to the patient's satisfaction.     [name]

## 2012-02-08 NOTE — Op Note (Signed)
02/08/2012  10:04 AM    Gwinda Maine  [id number]   Pre-Op Dx:  LEFT vocal cord paralysis, Presbylaryngus  Post-op Dx: same  Proc: MicroDL with Radiesse vocal cord augmentation bilateral   Surg:  [name] T MD  Anes:  GOT  EBL:  0 ml  Comp:  none  Findings:  LEFT vocal cord bowed, paralyzed and in the lateral position.  RIGHT vocal cord bowed.  Procedure: With the patient in a comfortable supine position, GOT anesthesia was induced without difficulty.  At an appropriate level, the table was turned 90 degrees away from Anesthesia.  A clean preparation and draping was performed in the standard fashion.  A rubber tooth guard was placed.   The Dedo anterior commissure laryngoscope was introduced taking care to protect lips, teeth, and endotracheal tube.  It was passed into the glottis and suspended in the standard fashion. The microscope was brought into the field.  4% cocaine solution on a cotton pledget was applied as both vocal cords for intraoperative hemostasis. Several minutes were allowed for this to take effect.  The pledget was then removed.  Radiesse hydroxyapatite filler, 1.0 cc with a long needle was brought into the field. Working into the depth of the vocalis muscle on the right side, the cord was augmented maintaining a smooth contour. After doing the right side, the left side was done requiring additional material because of the paralysis. 0.8 cc total was used counting some spillage. A good configuration of both vocal cords was felt to have been accomplished. A cocaine pledget was applied once again, allowed to remain in place for several minutes, and then removed. Hemostasis was observed.  At this point the procedure was completed. The laryngoscope was then suspended and removed. Dental status was intact.  The patient was returned to Anesthesia, awakened, extubated, and transferred to PACU in satisfactory condition.   Dispo:   PACU to home  Plan:  Ice,  elevation, mild voice rest. Resume diet and activity as comfortable. Recheck my office 3 weeks. Given low anticipated risk of postanesthetic or postsurgical complications, feel an outpatient venue is appropriate.  Cephus Richer MD

## 2012-02-08 NOTE — Anesthesia Procedure Notes (Signed)
Procedure Name: Intubation Date/Time: 02/08/2012 9:31 AM Performed by: Zenia Resides D Pre-anesthesia Checklist: Patient identified, Emergency Drugs available, Suction available, Patient being monitored and Timeout performed Patient Re-evaluated:Patient Re-evaluated prior to inductionOxygen Delivery Method: Circle System Utilized Preoxygenation: Pre-oxygenation with 100% oxygen Intubation Type: IV induction Ventilation: Mask ventilation without difficulty Laryngoscope Size: Mac and 3 Grade View: Grade II Tube type: Oral Tube size: 6.0 mm Number of attempts: 1 Airway Equipment and Method: stylet and oral airway Placement Confirmation: ETT inserted through vocal cords under direct vision,  positive ETCO2,  breath sounds checked- equal and bilateral and CO2 detector Tube secured with: Tape Dental Injury: Teeth and Oropharynx as per pre-operative assessment

## 2012-02-08 NOTE — Discharge Instructions (Signed)
Laryngoscopy A laryngoscopy is a procedure performed to view the back of the throat, vocal cords, and voice box (larynx). It may be done in order to figure out why a person has:  A cough.   Voice changes, such as new hoarseness.   Throat pain.   Problems swallowing.  During a laryngoscopy, tissue samples (biopsies) can be taken or foreign bodies can be removed. A laryngoscopy can be done in the caregiver's office. It may be performed with a hand-held mirror, or it may require the use of a fiberoptic scope. Under some circumstances, it may be done in a hospital with medicine to help you sleep (general anesthesia). LET YOUR CAREGIVER KNOW ABOUT:   Allergies.   Medicines taken, including herbs, eyedrops, over-the-counter medicines, and creams.   Use of steroids (by mouth or creams).   Previous problems with anesthetics or numbing medicines.   History of bleeding or blood problems.   History of blood clots.   Possibility of pregnancy, if this applies.   Previous surgery.   Other health problems.  RISKS AND COMPLICATIONS   Pain.   Gagging.   Vomiting.   Swelling.   Bleeding.   Problems from anesthesia.  BEFORE THE PROCEDURE  Several days before the procedure, you may have blood tests to make sure the blood clots normally. You may be asked to stop taking blood thinners, aspirin, and/or nonsteroidal anti-inflammatory drugs (NSAIDs) before the procedure. Do not give aspirin to children. If general anesthesia is going to be used, you will usually be asked to stop eating and drinking at least 8 hours before the procedure. Have someone go with you to the procedure in order to drive you home afterwards. PROCEDURE  If you are having the procedure in the caregiver's office, it is usually performed while sitting up in a special exam chair with a headrest. A numbing medicine will be sprayed into the mouth and on the back of the throat. Your caregiver will use a gauze pad to hold the  tongue out of the way. A mirror will be held at the back of the throat to allow your caregiver to see down the throat. You may be asked to make certain sounds so that vocal cord movement can be observed. If a fiberoptic scope is being used, it will be inserted into either the nose or the mouth and slipped into the throat. Your caregiver can look through an eyepiece or can see an image projected on a monitor. Pieces of tissue can be taken (biopsies) or foreign bodies removed. If you need to have the procedure performed under general anesthesia, you will be lying down on a special operating table. The same kinds of procedures will be followed, but you will not be aware of them. AFTER THE PROCEDURE   When laryngoscopy is done with only local numbing, it usually does not require any changes to your activity level after the procedure is complete.   You may have a sore throat.   You may be asked to rest the voice for some length of time after the procedure.   Do not smoke.   Follow your caregiver's directions regarding eating and drinking after the procedure.   If you had a biopsy taken, your caregiver may advise trying to avoid coughing, whispering, or clearing the throat.   If you were given a general anesthetic or a medicine to help you relax (sedative), you will be sleepy.   There may be some pain or you may feel sick   to your stomach (nauseous), but this can usually be controlled with medicines taken by mouth.   You will stay in the recovery room until awake and able to drink fluids.   You can go back to his or her usual level of activity within several days.  HOME CARE INSTRUCTIONS   Take all medicines exactly as directed.   Follow any prescribed diet.   Follow instructions regarding rest (including voice rest) and physical activity.   Follow instructions for the use of throat lozenges or gargles.  SEEK IMMEDIATE MEDICAL CARE IF:   You have severe pain.   You have new bleeding  during coughing, spitting, or vomiting.   You develop nausea and vomiting.   You develop new problems with swallowing.   You have difficulty breathing or have shortness of breath.   You have chest pain.   Your voice changes.   You have a fever.   You develop a cough.  MAKE SURE YOU:   Understand these instructions.   Will watch your condition.   Will get help right away if you are not doing well or get worse.  Document Released: 12/02/2009 Document Revised: 08/27/2011 Document Reviewed: 12/02/2009 [facility] Patient Information 2012 [place], [place].  Ice collar x 24 hrs. Advance diet and activity as tolerated Recheck my office 2-3 weeks Pain medication is also good for cough suppression.  Post Anesthesia Home Care Instructions  Activity: Get plenty of rest for the remainder of the day. A responsible adult should stay with you for 24 hours following the procedure.  For the next 24 hours, DO NOT: -Drive a car -Advertising copywriter -Drink alcoholic beverages -Take any medication unless instructed by your physician -Make any legal decisions or sign important papers.  Meals: Start with liquid foods such as gelatin or soup. Progress to regular foods as tolerated. Avoid greasy, spicy, heavy foods. If nausea and/or vomiting occur, drink only clear liquids until the nausea and/or vomiting subsides. Call your physician if vomiting continues.  Special Instructions/Symptoms: Your throat may feel dry or sore from the anesthesia or the breathing tube placed in your throat during surgery. If this causes discomfort, gargle with warm salt water. The discomfort should disappear within 24 hours.

## 2012-02-09 ENCOUNTER — Encounter (HOSPITAL_BASED_OUTPATIENT_CLINIC_OR_DEPARTMENT_OTHER): Payer: Self-pay | Admitting: Otolaryngology

## 2012-02-11 ENCOUNTER — Other Ambulatory Visit: Payer: Self-pay | Admitting: *Deleted

## 2012-02-11 DIAGNOSIS — C159 Malignant neoplasm of esophagus, unspecified: Secondary | ICD-10-CM

## 2012-02-11 DIAGNOSIS — R1013 Epigastric pain: Secondary | ICD-10-CM

## 2012-02-11 MED ORDER — HYDROCODONE-ACETAMINOPHEN 5-500 MG PO TABS: 1.0000 | ORAL_TABLET | Freq: Four times a day (QID) | ORAL | Status: DC | PRN

## 2012-02-25 ENCOUNTER — Other Ambulatory Visit: Payer: Self-pay | Admitting: *Deleted

## 2012-02-25 DIAGNOSIS — R1013 Epigastric pain: Secondary | ICD-10-CM

## 2012-02-25 MED ORDER — HYDROCODONE-ACETAMINOPHEN 5-500 MG PO TABS: 1.0000 | ORAL_TABLET | Freq: Four times a day (QID) | ORAL | Status: DC | PRN

## 2012-02-29 ENCOUNTER — Other Ambulatory Visit: Payer: Self-pay | Admitting: Thoracic Surgery

## 2012-02-29 DIAGNOSIS — C159 Malignant neoplasm of esophagus, unspecified: Secondary | ICD-10-CM

## 2012-03-02 ENCOUNTER — Encounter: Payer: Self-pay | Admitting: Thoracic Surgery

## 2012-03-02 ENCOUNTER — Ambulatory Visit (INDEPENDENT_AMBULATORY_CARE_PROVIDER_SITE_OTHER): Payer: Medicare Other | Admitting: Thoracic Surgery

## 2012-03-02 ENCOUNTER — Ambulatory Visit
Admission: RE | Admit: 2012-03-02 | Discharge: 2012-03-02 | Disposition: A | Discharge status: Home / Self Care | Payer: Medicare Other | Source: Ambulatory Visit | Attending: Thoracic Surgery | Admitting: Thoracic Surgery

## 2012-03-02 ENCOUNTER — Ambulatory Visit: Payer: Medicare Other | Admitting: Thoracic Surgery

## 2012-03-02 VITALS — BP 120/73 | HR 72 | Resp 18 | Ht 73.0 in | Wt 168.0 lb

## 2012-03-02 DIAGNOSIS — Z8501 Personal history of malignant neoplasm of esophagus: Secondary | ICD-10-CM

## 2012-03-02 DIAGNOSIS — C159 Malignant neoplasm of esophagus, unspecified: Secondary | ICD-10-CM

## 2012-03-02 NOTE — Progress Notes (Signed)
HPI patient returns today. He still has hoarseness and probably has a recurrent  laryngeal nerve palsy that is permanent. He is being followed by ENT for this. His incisions are well-healed. He is doing well overall. He is maintaining his weight. He does require a Vicodin as an antispasmodic for abdominal cramps as well as some mild esophageal spasm. He will be followed by Dr. Tyrone Sage in September. He also will see Dr. [name] in September. I he needs a followup CT scan of the chest and abdomen at that time. Overall he is doing well. He understands that time retiring.  Current Outpatient Prescriptions  Medication Sig Dispense Refill  . aspirin 81 MG tablet Take 81 mg by mouth every other day.       . diphenoxylate-atropine (LOMOTIL) 2.5-0.025 MG per tablet Take 1 tablet by mouth as needed.       [name] esomeprazole (NEXIUM) 40 MG capsule Take 40 mg by mouth 2 (two) times daily.       [name] HYDROcodone-acetaminophen (VICODIN) 5-500 MG per tablet Take 1 tablet by mouth every 6 (six) hours as needed for pain (take 1/2 to 1 tab prior to meals for post prandial pain).  60 tablet  0  . hyoscyamine (CYSTOSPAZ) 0.15 MG tablet Take 0.15 mg by mouth 1 day or 1 dose.      . zolpidem (AMBIEN) 10 MG tablet Take 10 mg by mouth at bedtime as needed.          Review of Systems: Continue occasional abdominal spasm   Physical Exam lungs are clear to auscultation percussion   Diagnostic Tests: Chest x-ray shows status post esophagogastrectomy with transhiatal reconstruction   Impression: Stage on file well stage II a esophageal cancer status post resection and chemotherapy and radiation   Plan: Followup by Dr. Tyrone Sage

## 2012-03-10 ENCOUNTER — Other Ambulatory Visit: Payer: Self-pay | Admitting: *Deleted

## 2012-03-10 DIAGNOSIS — R1013 Epigastric pain: Secondary | ICD-10-CM

## 2012-03-10 MED ORDER — HYDROCODONE-ACETAMINOPHEN 5-500 MG PO TABS: 1.0000 | ORAL_TABLET | Freq: Four times a day (QID) | ORAL | Status: DC | PRN

## 2012-03-17 ENCOUNTER — Encounter (HOSPITAL_BASED_OUTPATIENT_CLINIC_OR_DEPARTMENT_OTHER): Payer: Self-pay | Admitting: *Deleted

## 2012-03-17 NOTE — Progress Notes (Signed)
Has been here several times-last 5/13-no labs

## 2012-03-17 NOTE — H&P (Signed)
[name], [name] 66 y.o., male [id number]     Chief Complaint: Hoarseness  HPI:  18 mo s.p esophagectomy with LEFT vocal cord paralysis.  S/p 3 prior vocal cord augmentation procedures.  Dissappointed by less than anticipated improvement in hoarseness and projection after the most recent procedure.    Two-week recheck now approximately one month status post bilateral vocal cord Radiesse augmentation.  Voice remains quite weak and the cough mostly dysfunctional.  No pain.  No breathing issues.  He is disappointed that the voice quality is not as good this time as the 1st time.     He is having something that feels like muscle spasms occasionally when he swallows.  It is mildly uncomfortable.  It does not seem to depend on the consistency of what he is swallowing.  No obvious reflux.  He does not think he is aspirating.  He is 18 months out from his esophageal cancer.  He had a CT scan back in February which was clear and a chest x-ray with Dr. Edwyna Shell recently also clear.  He has had some dumping syndrome following his surgery and is aware of discomfort in his abdomen, but does not remember having this kind of throat discomfort previously.  PMH: Past Medical History  Diagnosis Date  . Esophageal cancer   . GERD (gastroesophageal reflux disease)     Surg Hx: Past Surgical History  Procedure Date  . Esophagectomy 10/02/2010    Dr.Burney  . Pyloroplasty 09/29/2010    Dr.Burney  . Jejunostomy 09/29/2010    Dr.Burney  . Direct laryngoscopy with radiaesse injection 02/08/2012    Procedure: DIRECT LARYNGOSCOPY WITH RADIAESSE INJECTION;  Surgeon: [name], MD;  Location: [facility];  Service: ENT;  Laterality: Bilateral;  microdirect-laryngosocopy  with bilateral vocal cord radiesse injection    FHx:  No family history on file. SocHx:  reports that he quit smoking about 2 years ago. He does not have any smokeless tobacco history on file. He reports that he does not  drink alcohol or use illicit drugs.  ALLERGIES: No Known Allergies  No prescriptions prior to admission    No results found for this or any previous visit (from the past 48 hour(s)). No results found.  ROS:  No SOB.  No wt loss.  No chest pain.  No evidence cancer recurrence.  There were no vitals taken for this visit.  PHYSICAL EXAM: He is trim and energetic.  His voice is quite weak.  Mental status is appropriate.  Ears are clear.  Anterior nose shows a mild rightward septal deviation.  Oral cavity and pharynx are moist and clear with teeth in good repair.  Neck with well-healed scars.   Using the flexible laryngoscope, the contour of the mobile RIGHT vocal cord may be very slightly bulging but definitely not bowed.  The LEFT vocal cord still appears substantially bowed, especially anteriorly.  Airway is good.  No pooling in valleculae or pyriforms.   Lungs: Clear to auscultation Heart: Regular rate and rhythm without murmurs Abdomen: Soft and active Extremities: Of normal configuration Neurologic testing: Symmetric and intact.      Assessment/Plan VOCAL CORD PARALYSIS NOS (478.30).  I discussed my findings.  I offered an evaluation at the [facility] voice lab.  He would like to proceed with one more attempt at vocal cord augmentation here.  I will do a direct laryngoscopy and esophagoscopy to see if I can shed any light on his new swallowing complaints.  I  discussed the surgery in detail including risks and complications.  Questions were answered and informed consent was obtained.  Lortab liquid prescription given for postoperative pain and cough suppression.  I discussed advancement of diet and activity postoperative.  We will plan to re-inject your LEFT vocal cord.  I will also have a look down your esophagus at that time, to try to help you with the cramping you are feeling in your throat.  We may end up doing a swallowing x-ray at some point also.   Recheck here  2-3 weeks after surgery.  Hydrocodone-Acetaminophen 7.5-500 MG/15ML Oral Solution;10-20 ml q4-6h prn pain; Qty300; R1; Rx.  [name], Larue Drawdy 03/17/2012, 6:02 PM

## 2012-03-21 ENCOUNTER — Encounter (HOSPITAL_BASED_OUTPATIENT_CLINIC_OR_DEPARTMENT_OTHER): Payer: Self-pay | Admitting: Anesthesiology

## 2012-03-21 ENCOUNTER — Ambulatory Visit (HOSPITAL_BASED_OUTPATIENT_CLINIC_OR_DEPARTMENT_OTHER): Payer: Medicare Other | Admitting: Anesthesiology

## 2012-03-21 ENCOUNTER — Encounter (HOSPITAL_BASED_OUTPATIENT_CLINIC_OR_DEPARTMENT_OTHER)
Admission: RE | Disposition: A | Discharge status: Home / Self Care | Payer: Self-pay | Source: Ambulatory Visit | Attending: Otolaryngology

## 2012-03-21 ENCOUNTER — Ambulatory Visit (HOSPITAL_BASED_OUTPATIENT_CLINIC_OR_DEPARTMENT_OTHER)
Admission: RE | Admit: 2012-03-21 | Discharge: 2012-03-21 | Disposition: A | Discharge status: Home / Self Care | Payer: Medicare Other | Source: Ambulatory Visit | Attending: Otolaryngology | Admitting: Otolaryngology

## 2012-03-21 DIAGNOSIS — K219 Gastro-esophageal reflux disease without esophagitis: Secondary | ICD-10-CM | POA: Insufficient documentation

## 2012-03-21 DIAGNOSIS — J3801 Paralysis of vocal cords and larynx, unilateral: Secondary | ICD-10-CM | POA: Insufficient documentation

## 2012-03-21 DIAGNOSIS — Z8501 Personal history of malignant neoplasm of esophagus: Secondary | ICD-10-CM | POA: Insufficient documentation

## 2012-03-21 HISTORY — PX: DIRECT LARYNGOSCOPY WITH RADIAESSE INJECTION: SHX5328

## 2012-03-21 HISTORY — PX: ESOPHAGOSCOPY: SHX5534

## 2012-03-21 LAB — POCT HEMOGLOBIN-HEMACUE: Hemoglobin: 14.1 g/dL (ref 13.0–17.0)

## 2012-03-21 SURGERY — LARYNGOSCOPY, DIRECT, WITH CALCIUM HYDROXYLAPATITE MICROSPHERE INJECTION
Anesthesia: General | Site: Mouth | Wound class: Clean Contaminated

## 2012-03-21 MED ORDER — SODIUM CHLORIDE 0.9 % IV SOLN: INTRAVENOUS | Status: DC

## 2012-03-21 MED ORDER — PROPOFOL 10 MG/ML IV EMUL
INTRAVENOUS | Status: DC | PRN
  Administered 2012-03-21: 200 mg via INTRAVENOUS

## 2012-03-21 MED ORDER — ONDANSETRON HCL 4 MG PO TABS: 4.0000 mg | ORAL_TABLET | ORAL | Status: DC | PRN

## 2012-03-21 MED ORDER — EPINEPHRINE HCL 1 MG/ML IJ SOLN
INTRAMUSCULAR | Status: DC | PRN
  Administered 2012-03-21: 1 mg

## 2012-03-21 MED ORDER — DEXAMETHASONE SODIUM PHOSPHATE 4 MG/ML IJ SOLN
INTRAMUSCULAR | Status: DC | PRN
  Administered 2012-03-21: 10 mg via INTRAVENOUS

## 2012-03-21 MED ORDER — LIDOCAINE HCL (CARDIAC) 20 MG/ML IV SOLN
INTRAVENOUS | Status: DC | PRN
  Administered 2012-03-21: 50 mg via INTRAVENOUS

## 2012-03-21 MED ORDER — LACTATED RINGERS IV SOLN
INTRAVENOUS | Status: DC
  Administered 2012-03-21 (×2): via INTRAVENOUS

## 2012-03-21 MED ORDER — METOCLOPRAMIDE HCL 5 MG/ML IJ SOLN: 10.0000 mg | Freq: Once | INTRAMUSCULAR | Status: DC | PRN

## 2012-03-21 MED ORDER — FENTANYL CITRATE 0.05 MG/ML IJ SOLN: 25.0000 ug | INTRAMUSCULAR | Status: DC | PRN

## 2012-03-21 MED ORDER — ACETAMINOPHEN 10 MG/ML IV SOLN
1000.0000 mg | Freq: Once | INTRAVENOUS | Status: AC
  Administered 2012-03-21: 1000 mg via INTRAVENOUS

## 2012-03-21 MED ORDER — ONDANSETRON HCL 4 MG/2ML IJ SOLN: 4.0000 mg | INTRAMUSCULAR | Status: DC | PRN

## 2012-03-21 MED ORDER — HYDROCODONE-ACETAMINOPHEN 7.5-500 MG/15ML PO SOLN: 10.0000 mL | ORAL | Status: DC | PRN

## 2012-03-21 MED ORDER — OXYCODONE HCL 5 MG PO TABS: 5.0000 mg | ORAL_TABLET | Freq: Once | ORAL | Status: DC | PRN

## 2012-03-21 MED ORDER — ONDANSETRON HCL 4 MG/2ML IJ SOLN
INTRAMUSCULAR | Status: DC | PRN
  Administered 2012-03-21: 4 mg via INTRAVENOUS

## 2012-03-21 MED ORDER — METOCLOPRAMIDE HCL 5 MG/ML IJ SOLN
INTRAMUSCULAR | Status: DC | PRN
  Administered 2012-03-21: 10 mg via INTRAVENOUS

## 2012-03-21 MED ORDER — SUCCINYLCHOLINE CHLORIDE 20 MG/ML IJ SOLN
INTRAMUSCULAR | Status: DC | PRN
  Administered 2012-03-21: 100 mg via INTRAVENOUS

## 2012-03-21 MED ORDER — FENTANYL CITRATE 0.05 MG/ML IJ SOLN
INTRAMUSCULAR | Status: DC | PRN
  Administered 2012-03-21 (×2): 25 ug via INTRAVENOUS

## 2012-03-21 SURGICAL SUPPLY — 30 items
CANISTER SUCTION 1200CC (MISCELLANEOUS) ×2 IMPLANT
CLOTH BEACON ORANGE TIMEOUT ST (SAFETY) ×2 IMPLANT
CONT SPEC 4OZ CLIKSEAL STRL BL (MISCELLANEOUS) IMPLANT
GAUZE SPONGE 4X4 12PLY STRL LF (GAUZE/BANDAGES/DRESSINGS) ×4 IMPLANT
GLOVE ECLIPSE 6.5 STRL STRAW (GLOVE) ×1 IMPLANT
GLOVE ECLIPSE 8.0 STRL XLNG CF (GLOVE) ×2 IMPLANT
GOWN PREVENTION PLUS XLARGE (GOWN DISPOSABLE) ×1 IMPLANT
GOWN PREVENTION PLUS XXLARGE (GOWN DISPOSABLE) ×2 IMPLANT
GUARD TEETH (MISCELLANEOUS) ×1 IMPLANT
KIT PROLARN PLUS GEL W/NDL (Prosthesis and Implant ENT) ×1 IMPLANT
MARKER SKIN DUAL TIP RULER LAB (MISCELLANEOUS) IMPLANT
NDL HYPO 18GX1.5 BLUNT FILL (NEEDLE) IMPLANT
NDL SPNL 22GX7 QUINCKE BK (NEEDLE) IMPLANT
NDL TRANS ORAL INJECTION (NEEDLE) IMPLANT
NEEDLE HYPO 18GX1.5 BLUNT FILL (NEEDLE) IMPLANT
NEEDLE SPNL 22GX7 QUINCKE BK (NEEDLE) IMPLANT
NEEDLE TRANS ORAL INJECTION (NEEDLE) ×2 IMPLANT
NS IRRIG 1000ML POUR BTL (IV SOLUTION) ×2 IMPLANT
PATTIES SURGICAL .5 X3 (DISPOSABLE) ×2 IMPLANT
SHEET MEDIUM DRAPE 40X70 STRL (DRAPES) ×2 IMPLANT
SLEEVE SCD COMPRESS KNEE MED (MISCELLANEOUS) ×2 IMPLANT
SOLUTION BUTLER CLEAR DIP (MISCELLANEOUS) ×2 IMPLANT
SURGILUBE 2OZ TUBE FLIPTOP (MISCELLANEOUS) ×1 IMPLANT
SYR 3ML 23GX1 SAFETY (SYRINGE) ×1 IMPLANT
SYR 5ML LL (SYRINGE) IMPLANT
SYR CONTROL 10ML LL (SYRINGE) IMPLANT
TOWEL OR 17X24 6PK STRL BLUE (TOWEL DISPOSABLE) ×2 IMPLANT
TOWEL OR NON WOVEN STRL DISP B (DISPOSABLE) ×2 IMPLANT
TUBE CONNECTING 20X1/4 (TUBING) ×2 IMPLANT
WATER STERILE IRR 1000ML POUR (IV SOLUTION) ×1 IMPLANT

## 2012-03-21 NOTE — Interval H&P Note (Signed)
History and Physical Interval Note:  03/21/2012 9:30 AM  [name]  has presented today for surgery, with the diagnosis of left vocal cord paralysis and dysphagia  The various methods of treatment have been discussed with the patient and family. After consideration of risks, benefits and other options for treatment, the patient has consented to  Procedure(s) (LRB): DIRECT LARYNGOSCOPY WITH RADIAESSE INJECTION (N/A) ESOPHAGOSCOPY (N/A) as a surgical intervention .  The patient's history has been re-reviewed, patient re-examined, no change in status, stable for surgery.  I have re-reviewed the patient's chart and labs.  Questions were answered to the patient's satisfaction.     [name]

## 2012-03-21 NOTE — Discharge Instructions (Signed)
Laryngoscopy A laryngoscopy is a procedure performed to view the back of the throat, vocal cords, and voice box (larynx). It may be done in order to figure out why a person has:  A cough.   Voice changes, such as new hoarseness.   Throat pain.   Problems swallowing.  During a laryngoscopy, tissue samples (biopsies) can be taken or foreign bodies can be removed. A laryngoscopy can be done in the caregiver's office. It may be performed with a hand-held mirror, or it may require the use of a fiberoptic scope. Under some circumstances, it may be done in a hospital with medicine to help you sleep (general anesthesia). LET YOUR CAREGIVER KNOW ABOUT:   Allergies.   Medicines taken, including herbs, eyedrops, over-the-counter medicines, and creams.   Use of steroids (by mouth or creams).   Previous problems with anesthetics or numbing medicines.   History of bleeding or blood problems.   History of blood clots.   Possibility of pregnancy, if this applies.   Previous surgery.   Other health problems.  RISKS AND COMPLICATIONS   Pain.   Gagging.   Vomiting.   Swelling.   Bleeding.   Problems from anesthesia.  BEFORE THE PROCEDURE  Several days before the procedure, you may have blood tests to make sure the blood clots normally. You may be asked to stop taking blood thinners, aspirin, and/or nonsteroidal anti-inflammatory drugs (NSAIDs) before the procedure. Do not give aspirin to children. If general anesthesia is going to be used, you will usually be asked to stop eating and drinking at least 8 hours before the procedure. Have someone go with you to the procedure in order to drive you home afterwards. PROCEDURE  If you are having the procedure in the caregiver's office, it is usually performed while sitting up in a special exam chair with a headrest. A numbing medicine will be sprayed into the mouth and on the back of the throat. Your caregiver will use a gauze pad to hold the  tongue out of the way. A mirror will be held at the back of the throat to allow your caregiver to see down the throat. You may be asked to make certain sounds so that vocal cord movement can be observed. If a fiberoptic scope is being used, it will be inserted into either the nose or the mouth and slipped into the throat. Your caregiver can look through an eyepiece or can see an image projected on a monitor. Pieces of tissue can be taken (biopsies) or foreign bodies removed. If you need to have the procedure performed under general anesthesia, you will be lying down on a special operating table. The same kinds of procedures will be followed, but you will not be aware of them. AFTER THE PROCEDURE   When laryngoscopy is done with only local numbing, it usually does not require any changes to your activity level after the procedure is complete.   You may have a sore throat.   You may be asked to rest the voice for some length of time after the procedure.   Do not smoke.   Follow your caregiver's directions regarding eating and drinking after the procedure.   If you had a biopsy taken, your caregiver may advise trying to avoid coughing, whispering, or clearing the throat.   If you were given a general anesthetic or a medicine to help you relax (sedative), you will be sleepy.   There may be some pain or you may feel sick   to your stomach (nauseous), but this can usually be controlled with medicines taken by mouth.   You will stay in the recovery room until awake and able to drink fluids.   You can go back to his or her usual level of activity within several days.  HOME CARE INSTRUCTIONS   Take all medicines exactly as directed.   Follow any prescribed diet.   Follow instructions regarding rest (including voice rest) and physical activity.   Follow instructions for the use of throat lozenges or gargles.  SEEK IMMEDIATE MEDICAL CARE IF:   You have severe pain.   You have new bleeding  during coughing, spitting, or vomiting.   You develop nausea and vomiting.   You develop new problems with swallowing.   You have difficulty breathing or have shortness of breath.   You have chest pain.   Your voice changes.   You have a fever.   You develop a cough.  MAKE SURE YOU:   Understand these instructions.   Will watch your condition.   Will get help right away if you are not doing well or get worse.  Document Released: 12/02/2009 Document Revised: 08/27/2011 Document Reviewed: 12/02/2009 [facility] Patient Information 2012 [place], [place].       Post Anesthesia Home Care Instructions  Activity: Get plenty of rest for the remainder of the day. A responsible adult should stay with you for 24 hours following the procedure.  For the next 24 hours, DO NOT: -Drive a car -Advertising copywriter -Drink alcoholic beverages -Take any medication unless instructed by your physician -Make any legal decisions or sign important papers.  Meals: Start with liquid foods such as gelatin or soup. Progress to regular foods as tolerated. Avoid greasy, spicy, heavy foods. If nausea and/or vomiting occur, drink only clear liquids until the nausea and/or vomiting subsides. Call your physician if vomiting continues.  Special Instructions/Symptoms: Your throat may feel dry or sore from the anesthesia or the breathing tube placed in your throat during surgery. If this causes discomfort, gargle with warm salt water. The discomfort should disappear within 24 hours.

## 2012-03-21 NOTE — Anesthesia Procedure Notes (Signed)
Procedure Name: Intubation Date/Time: 03/21/2012 10:00 AM Performed by: Zenia Resides D Pre-anesthesia Checklist: Patient identified, Emergency Drugs available, Suction available, Patient being monitored and Timeout performed Patient Re-evaluated:Patient Re-evaluated prior to inductionOxygen Delivery Method: Circle System Utilized Preoxygenation: Pre-oxygenation with 100% oxygen Intubation Type: IV induction Ventilation: Mask ventilation without difficulty Laryngoscope Size: Mac and 3 Grade View: Grade II Tube type: Oral Tube size: 5.5 mm Number of attempts: 1 Airway Equipment and Method: stylet and oral airway Placement Confirmation: ETT inserted through vocal cords under direct vision,  positive ETCO2 and breath sounds checked- equal and bilateral Secured at: 21 cm Tube secured with: Tape Dental Injury: Teeth and Oropharynx as per pre-operative assessment

## 2012-03-21 NOTE — Anesthesia Preprocedure Evaluation (Signed)
Anesthesia Evaluation  Patient identified by MRN, date of birth, ID band Patient awake    Reviewed: Allergy & Precautions, H&P , NPO status , Patient's Chart, lab work & pertinent test results, reviewed documented beta blocker date and time   Airway Mallampati: II TM Distance: >3 FB Neck ROM: full    Dental   Pulmonary neg pulmonary ROS,          Cardiovascular negative cardio ROS      Neuro/Psych negative neurological ROS  negative psych ROS   GI/Hepatic Neg liver ROS, GERD-  Medicated,  Endo/Other  negative endocrine ROS  Renal/GU negative Renal ROS  negative genitourinary   Musculoskeletal   Abdominal   Peds  Hematology negative hematology ROS (+)   Anesthesia Other Findings See surgeon's H&P   Reproductive/Obstetrics negative OB ROS                           Anesthesia Physical Anesthesia Plan  ASA: II  Anesthesia Plan: General   Post-op Pain Management:    Induction: Intravenous  Airway Management Planned: Oral ETT  Additional Equipment:   Intra-op Plan:   Post-operative Plan: Extubation in OR  Informed Consent: I have reviewed the patients History and Physical, chart, labs and discussed the procedure including the risks, benefits and alternatives for the proposed anesthesia with the patient or authorized representative who has indicated his/her understanding and acceptance.   Dental Advisory Given  Plan Discussed with: CRNA and Surgeon  Anesthesia Plan Comments:         Anesthesia Quick Evaluation

## 2012-03-21 NOTE — Transfer of Care (Signed)
Immediate Anesthesia Transfer of Care Note  Patient: [name]  Procedure(s) Performed: Procedure(s) (LRB): DIRECT LARYNGOSCOPY WITH RADIAESSE INJECTION (N/A) ESOPHAGOSCOPY (N/A)  Patient Location: PACU  Anesthesia Type: General  Level of Consciousness: awake, alert  and oriented  Airway & Oxygen Therapy: Patient Spontanous Breathing and Patient connected to nasal cannula oxygen  Post-op Assessment: Report given to PACU RN and Post -op Vital signs reviewed and stable  Post vital signs: Reviewed and stable  Complications: No apparent anesthesia complications

## 2012-03-21 NOTE — Anesthesia Postprocedure Evaluation (Signed)
Anesthesia Post Note  Patient: [name]  Procedure(s) Performed: Procedure(s) (LRB): DIRECT LARYNGOSCOPY WITH RADIAESSE INJECTION (N/A) ESOPHAGOSCOPY (N/A)  Anesthesia type: General  Patient location: PACU  Post pain: Pain level controlled  Post assessment: Patient's Cardiovascular Status Stable  Last Vitals:  Filed Vitals:   03/21/12 1047  BP:   Pulse: 74  Temp:   Resp: 13    Post vital signs: Reviewed and stable  Level of consciousness: alert  Complications: No apparent anesthesia complications

## 2012-03-21 NOTE — Op Note (Signed)
03/21/2012  10:45 AM   [name]  [id number]   Pre-Op Dx: Left vocal cord paralysis   Post-op Dx: Same   Proc: Microlaryngoscopy with Radiesse vocal cord augmentation , cervical esophagoscopy   Surg: [name] T MD  Anes: GOT   EBL: Minimal   Comp: None   Findings: Very slight bulged contour of the mobile right vocal cord, status post prior Radiesse injection. Moderate bowing of a paralyzed left vocal cord consistent with atrophy moderate friability of the gastric mucosa in the gastric pullup segment. Reasonably patent anastomosis. No ulceration or lesion.   Procedure: With the patient in a comfortable supine position, GOT anesthesia was induced without difficulty.  At an appropriate level, the table was turned 90 degrees away from Anesthesia. A clean preparation and draping was performed in the standard fashion.  A rubber tooth guard was placed. The cervical laryngoscope was lubricated and inserted into the hypopharynx. With gentle pressure, it was passed through the cricopharyngeus and advanced to its full length without difficulty with the findings as described above. It was removed.  The anterior commissure laryngoscope was introduced taking care to protect lips, teeth, and endotracheal tube. the endolarynx was inspected with the findings as described above. The laryngoscope was suspended in the standard fashion. Photographs were taken. 1:1000 topical epinephrine was applied with a cottonoid against the mucosa of the vocal cords for intraoperative hemostasis. Several minutes were allowed for this to take effect.  Standard Radiesse solution was applied on a long needle under direct vision, 0.4 cc total, to augment the bowed left vocal cord. This was done in several passes. The radius was massaged into a smooth contour. A moderately bowed configuration was generated. Hemostasis was spontaneous. Epinephrine was applied against the vocal cords again using a cottonoid. This was  removed. Photographs were taken again. The laryngoscope was un-suspended and removed.  At this point the procedure was completed. Dental status was intact. The patient was returned to Anesthesia, awakened, extubated, and transferred to PACU in satisfactory condition.   Dispo: PACU to home   Plan: ice, elevation, analgesia. No real voice rest required. Given low anticipated risk of postanesthetic or postsurgical complications, feel an outpatient venue is appropriate. Recheck my office 3 weeks.   Cephus Richer MD

## 2012-03-25 ENCOUNTER — Other Ambulatory Visit: Payer: Self-pay | Admitting: *Deleted

## 2012-03-25 ENCOUNTER — Encounter (HOSPITAL_BASED_OUTPATIENT_CLINIC_OR_DEPARTMENT_OTHER): Payer: Self-pay | Admitting: Otolaryngology

## 2012-03-25 DIAGNOSIS — R1013 Epigastric pain: Secondary | ICD-10-CM

## 2012-03-25 MED ORDER — HYDROCODONE-ACETAMINOPHEN 5-500 MG PO TABS: 1.0000 | ORAL_TABLET | Freq: Four times a day (QID) | ORAL | Status: DC | PRN

## 2012-05-02 ENCOUNTER — Other Ambulatory Visit: Payer: Self-pay | Admitting: Cardiothoracic Surgery

## 2012-05-02 DIAGNOSIS — C159 Malignant neoplasm of esophagus, unspecified: Secondary | ICD-10-CM

## 2012-06-08 ENCOUNTER — Other Ambulatory Visit: Payer: Self-pay | Admitting: Thoracic Surgery

## 2012-06-09 ENCOUNTER — Ambulatory Visit (INDEPENDENT_AMBULATORY_CARE_PROVIDER_SITE_OTHER): Payer: Medicare Other | Admitting: Cardiothoracic Surgery

## 2012-06-09 ENCOUNTER — Ambulatory Visit
Admission: RE | Admit: 2012-06-09 | Discharge: 2012-06-09 | Disposition: A | Discharge status: Home / Self Care | Payer: Medicare Other | Source: Ambulatory Visit | Attending: Cardiothoracic Surgery | Admitting: Cardiothoracic Surgery

## 2012-06-09 ENCOUNTER — Encounter: Payer: Self-pay | Admitting: Cardiothoracic Surgery

## 2012-06-09 VITALS — BP 131/73 | HR 98 | Resp 16 | Ht 73.0 in | Wt 167.0 lb

## 2012-06-09 DIAGNOSIS — Z8501 Personal history of malignant neoplasm of esophagus: Secondary | ICD-10-CM

## 2012-06-09 DIAGNOSIS — C159 Malignant neoplasm of esophagus, unspecified: Secondary | ICD-10-CM

## 2012-06-09 LAB — CREATININE, SERUM: Creat: 0.89 mg/dL (ref 0.50–1.35)

## 2012-06-09 LAB — BUN: BUN: 14 mg/dL (ref 6–23)

## 2012-06-09 MED ORDER — IOHEXOL 300 MG/ML  SOLN
100.0000 mL | Freq: Once | INTRAMUSCULAR | Status: AC | PRN
  Administered 2012-06-09: 100 mL via INTRAVENOUS

## 2012-06-09 NOTE — Patient Instructions (Signed)
Ct scan shows no evidence of recurrence Return jan 2014

## 2012-06-10 NOTE — Progress Notes (Signed)
[address]            [place] [address]          [phone]       [name] [facility] Medical Record #[id number] Date of Birth: April 11, 1946  [name] Floro, MD [name] Floro, MD  Chief Complaint:   PostOp Follow Up Visit Transhiatal total esophagectomy with cervical esophagogastrostomy complicated by anastomotic leak and left recurrent nerve palsy by Dr. Edwyna Shell January 2012  History of Present Illness:      patient returns today. He still has hoarseness and probably has a recurrent  laryngeal nerve palsy that is permanent. He is being followed by ENT for this. His incisions are well-healed. He is doing well overall. He is maintaining his weight. Early after surgery he did have symptoms of dumping but notes that now this is well-controlled by diet manipulation, he continues to swallow without obvious difficulty  He comes in today for followup CT scan previous arranged by Dr. Edwyna Shell       History  Smoking status  . Former Smoker  . Quit date: 06/13/2009  Smokeless tobacco  . Not on file       No Known Allergies  Current Outpatient Prescriptions  Medication Sig Dispense Refill  . aspirin 81 MG tablet Take 81 mg by mouth every other day.       . diphenoxylate-atropine (LOMOTIL) 2.5-0.025 MG per tablet Take 1 tablet by mouth as needed.       [name] esomeprazole (NEXIUM) 40 MG capsule Take 40 mg by mouth 2 (two) times daily.       . hyoscyamine (CYSTOSPAZ) 0.15 MG tablet Take 0.15 mg by mouth 1 day or 1 dose.      . zolpidem (AMBIEN) 10 MG tablet Take 10 mg by mouth at bedtime as needed.       [name] HYDROcodone-acetaminophen (VICODIN) 5-500 MG per tablet Take 1 tablet by mouth every 6 (six) hours as needed for pain (take 1/2 to 1 tab prior to meals for post prandial pain).  60 tablet  0       Physical Exam: BP 131/73  Pulse 98  Resp 16  Ht 6\' 1"  (1.854 m)  Wt 167 lb (75.751 kg)  BMI 22.03 kg/m2  SpO2 96%  General  appearance: alert, cooperative and Worse while speaking Neurologic: intact Heart: regular rate and rhythm, S1, S2 normal, no murmur, click, rub or gallop and normal apical impulse Lungs: clear to auscultation bilaterally and normal percussion bilaterally Abdomen: soft, non-tender; bowel sounds normal; no masses,  no organomegaly Extremities: extremities normal, atraumatic, no cyanosis or edema, Homans sign is negative, no sign of DVT and no edema, redness or tenderness in the calves or thighs Wound: Well-healed   Diagnostic Studies & Laboratory data:         Recent Radiology Findings: Ct Chest W Contrast  06/09/2012  *RADIOLOGY REPORT*  Clinical Data:  Esophageal cancer  CT CHEST AND ABDOMEN WITH CONTRAST  Technique:  Multidetector CT imaging of the chest, abdomen and pelvis was performed following the standard protocol during bolus administration of intravenous contrast.  Contrast: OMNIPAQUE IOHEXOL 300 MG/ML  SOLN  Comparison:   11/17/2011.  CT CHEST  Findings:  Status post esophagectomy with gastric pull-through. Normal caliber aorta with scattered atherosclerosis.  Normal heart size with coronary artery calcification. Trace pleural fluid bilaterally.  No  pericardial effusion.  No intrathoracic lymphadenopathy.  Mild peripheral scarring within the right lung and mild left paramediastinal scarring or atelectasis.  Lungs are otherwise clear.  No pneumothorax.  Central airways are patent.  No acute osseous finding.  IMPRESSION: Status post esophagectomy with gastric pull-through.  No acute or metastatic intrathoracic process identified.  CT ABDOMEN  Findings:  Unremarkable liver, biliary system, spleen, pancreas, adrenal glands.  Nonobstructing left renal stone.  Otherwise symmetric renal enhancement.  No hydronephrosis or proximal hydroureter. No intra-abdominal evidence of bowel obstruction.  No abdominal intraperitoneal free air or fluid.  No intra-abdominal lymphadenopathy.  Scattered  atherosclerosis of the intra-abdominal aorta and branch vessels.  Sclerotic focus within the right iliac bone is unchanged.  Mild multilevel degenerative changes without acute osseous finding.  IMPRESSION: No acute or metastatic process identified within the abdomen.  Nonobstructing left renal stone.   Original Report Authenticated By: Waneta Martins, M.D.    Ct Abdomen W Contrast  06/09/2012  *RADIOLOGY REPORT*  Clinical Data:  Esophageal cancer  CT CHEST AND ABDOMEN WITH CONTRAST  Technique:  Multidetector CT imaging of the chest, abdomen and pelvis was performed following the standard protocol during bolus administration of intravenous contrast.  Contrast: OMNIPAQUE IOHEXOL 300 MG/ML  SOLN  Comparison:   11/17/2011.  CT CHEST  Findings:  Status post esophagectomy with gastric pull-through. Normal caliber aorta with scattered atherosclerosis.  Normal heart size with coronary artery calcification. Trace pleural fluid bilaterally.  No pericardial effusion.  No intrathoracic lymphadenopathy.  Mild peripheral scarring within the right lung and mild left paramediastinal scarring or atelectasis.  Lungs are otherwise clear.  No pneumothorax.  Central airways are patent.  No acute osseous finding.  IMPRESSION: Status post esophagectomy with gastric pull-through.  No acute or metastatic intrathoracic process identified.  CT ABDOMEN  Findings:  Unremarkable liver, biliary system, spleen, pancreas, adrenal glands.  Nonobstructing left renal stone.  Otherwise symmetric renal enhancement.  No hydronephrosis or proximal hydroureter. No intra-abdominal evidence of bowel obstruction.  No abdominal intraperitoneal free air or fluid.  No intra-abdominal lymphadenopathy.  Scattered atherosclerosis of the intra-abdominal aorta and branch vessels.  Sclerotic focus within the right iliac bone is unchanged.  Mild multilevel degenerative changes without acute osseous finding.  IMPRESSION: No acute or metastatic process  identified within the abdomen.  Nonobstructing left renal stone.   Original Report Authenticated By: Waneta Martins, M.D.       Recent Labs: Lab Results  Component Value Date   WBC 5.8 10/19/2010   HGB 14.1 03/21/2012   HCT 33.5* 10/19/2010   PLT 389 10/19/2010   GLUCOSE 121* 10/19/2010   ALT 34 10/19/2010   AST 32 10/19/2010   NA 138 10/19/2010   K 3.6 10/19/2010   CL 103 10/19/2010   CREATININE 0.89 06/08/2012   BUN 14 06/08/2012   CO2 26 10/19/2010   INR 0.89 09/28/2010      Assessment / Plan:      Patient now 18 months following transhiatal total esophagectomy for distal esophageal cancer. He was treated preoperatively with radiation and chemotherapy final path showed ulcerated gastric mucosa negative nodes ypTx,pN0,PMx.  Followup CT scan today shows no evidence of recurrence  I plan to see the patient back in January of 2014, I have arranged for a followup CT scan at this point and we'll coordinate this with Dr. Jetty Peeks B 06/10/2012 6:30 PM

## 2012-06-13 ENCOUNTER — Ambulatory Visit (HOSPITAL_BASED_OUTPATIENT_CLINIC_OR_DEPARTMENT_OTHER): Payer: Medicare Other | Admitting: Oncology

## 2012-06-13 ENCOUNTER — Telehealth: Payer: Self-pay | Admitting: Oncology

## 2012-06-13 VITALS — BP 109/63 | HR 79 | Temp 98.0°F | Resp 18 | Ht 73.0 in | Wt 161.8 lb

## 2012-06-13 DIAGNOSIS — R109 Unspecified abdominal pain: Secondary | ICD-10-CM

## 2012-06-13 DIAGNOSIS — R197 Diarrhea, unspecified: Secondary | ICD-10-CM

## 2012-06-13 DIAGNOSIS — C155 Malignant neoplasm of lower third of esophagus: Secondary | ICD-10-CM

## 2012-06-13 DIAGNOSIS — C159 Malignant neoplasm of esophagus, unspecified: Secondary | ICD-10-CM

## 2012-06-13 NOTE — Telephone Encounter (Signed)
gve the pt his June 2014 appt calendar °

## 2012-06-13 NOTE — Progress Notes (Signed)
   [facility]     OFFICE PROGRESS NOTE   INTERVAL HISTORY:   He returns as scheduled. He feels well. Good appetite and energy level. He is exercising. No dysphagia.  The hoarseness persists and he receives intermittent vocal cord injections with improvement.  Objective:  Vital signs in last 24 hours:  Blood pressure 109/63, pulse 79, temperature 98 F (36.7 C), temperature source Oral, resp. rate 18, height 6\' 1"  (1.854 m), weight 161 lb 12.8 oz (73.392 kg).    HEENT: Neck without mass Lymphatics: No cervical, supraclavicular, or axillary node Resp: Lungs clear bilaterally Cardio: Regular rate and rhythm GI: No hepatomegaly, no mass, nontender Vascular: No leg edema  X-rays: CT the chest and abdomen on 06/09/2012-no evidence of metastatic disease.  Medications: I have reviewed the patient's current medications.  Assessment/Plan: 1. 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  a. Staging CT/PET scan evaluation showed evidence of local metastatic disease involving lymph nodes but no distant metastases. b. Status post weekly Taxol/carboplatin chemotherapy and concurrent radiation with the last week of chemotherapy given on 08/12/2010 and radiation completed 08/18/2010.  c. Restaging PET scan December 2011 revealed an interval decrease in the size of metabolic activity of the primary esophagus tumor. No evidence of hypermetabolic lymph node or distant metastases.  d. Esophagogastrectomy 09/29/2010 with no residual carcinoma identified. e. Restaging CT scans chest and abdomen 11/17/2011 negative for recurrent/metastatic disease. f. Restaging CT scan 06/09/2012 negative for recurrent esophagus cancer 2. History of stage T1C prostate cancer. He continues an observation approach. He is followed by Dr. Isabel Caprice. 3. History of hypertension. 4. History of dysphagia and weight loss  secondary to the obstructing esophagus mass, resolved. 5. History of vesicular lesions at the right pubic area with the appearance of herpetic lesions. He was treated with Valtrex. 6. Postoperative anastomotic leak, resolved. 7. Hoarseness, followed by Dr. [name] with repeat focal cord injection therapy 8. Abdominal discomfort and diarrhea. Controlled with a combination of hydrocodone, Levsin and Nexium.   Disposition:  He remains in clinical remission from esophagus cancer. Mr. Tamashiro is scheduled to see Dr. Tyrone Sage in January. He will return for an office visit here in 8 months.   Thornton Papas, MD  06/13/2012  11:35 AM

## 2012-07-06 ENCOUNTER — Encounter: Payer: Self-pay | Admitting: Dietician

## 2012-07-06 NOTE — Progress Notes (Signed)
Brief Out-patient Oncology Nutrition Note  Reason: Patient Screened Positive For Nutrition Risk For Unintentional Weight Loss and Decreased Appetite.   [name] is a 66 year old male patient of [name], diagnosed with malignant neoplasm of esophagus. Contacted patient via telephone for positive nutrition risk. He reported his appetite is usually good. He reported he is having trouble keeping his weight up but does not want to consume a lot of fat. He reported he is trying to eat a lot of protein. He reported he drinks whey protein.   Wt Readings from Last 10 Encounters:  06/13/12 161 lb 12.8 oz (73.392 kg)  06/09/12 167 lb (75.751 kg)  03/02/12 168 lb (76.204 kg)  12/10/11 170 lb 9.6 oz (77.384 kg)  11/17/11 171 lb (77.565 kg)  07/15/11 162 lb (73.483 kg)    I have educated the patient on high calorie, high protein diet. We discussed strategies to increase caloric intake. I have encouraged the patient to increase PO intake and to consume small frequent meals throughout the day. The patient was without any further nutrition related questions. Provided RD contact information and instructed patient to contact RD for future questions.   RD available for nutrition needs.   [name] Finn, MS, RD, LDN [phone]

## 2012-07-27 ENCOUNTER — Encounter (HOSPITAL_BASED_OUTPATIENT_CLINIC_OR_DEPARTMENT_OTHER): Payer: Self-pay | Admitting: *Deleted

## 2012-07-29 NOTE — H&P (Signed)
  [name], [name] 66 y.o., male [id number]     Chief Complaint: LEFT vocal cord paralysis/dysphonia  HPI: 3 months check, now 3-1/2 months status post our last microlaryngoscopy with Radiesse vocal cord injection.  He feels like this correction has been the best perhaps with a single exception of the very 1st time.  Voice has been stronger, breathing without difficulty, and swallowing without trouble.  We did inspect his upper esophagus last time with no significant findings.  He discovered that he was gulping his food and has backed off slightly and the swallowing issues are no longer present.  She is seeing Dr. Tyrone Sage since Dr. Scheryl Darter retirement.  To his knowledge, he is disease-free from his esophageal cancer.    PMH: Past Medical History  Diagnosis Date  . GERD (gastroesophageal reflux disease)   . Esophageal cancer     and prostate  . Hoarseness   . Abdominal discomfort     Surg Hx: Past Surgical History  Procedure Date  . Esophagectomy 10/02/2010    Dr.Burney  . Pyloroplasty 09/29/2010    Dr.Burney  . Jejunostomy 09/29/2010    Dr.Burney  . Direct laryngoscopy with radiaesse injection 02/08/2012    Procedure: DIRECT LARYNGOSCOPY WITH RADIAESSE INJECTION;  Surgeon: [name], MD;  Location: [facility];  Service: ENT;  Laterality: Bilateral;  microdirect-laryngosocopy  with bilateral vocal cord radiesse injection  . Direct laryngoscopy with radiaesse injection 03/21/2012    Procedure: DIRECT LARYNGOSCOPY WITH RADIAESSE INJECTION;  Surgeon: [name], MD;  Location: [facility];  Service: ENT;  Laterality: N/A;  microdirect laryngoscopy with radiaesse injection left vocal cord  . Esophagoscopy 03/21/2012    Procedure: ESOPHAGOSCOPY;  Surgeon: [name], MD;  Location:  SURGERY CENTER;  Service: ENT;  Laterality: N/A;    FHx:  No family history on file. SocHx:  reports that he quit smoking about 3 years ago. He does not have  any smokeless tobacco history on file. He reports that he does not drink alcohol or use illicit drugs.  ALLERGIES: No Known Allergies  No prescriptions prior to admission    No results found for this or any previous visit (from the past 48 hour(s)). No results found.   There were no vitals taken for this visit.  PHYSICAL EXAM:  He is tall and thin.  Voice is phonatory but weak.  No stridor.  He is not coughing or clearing his throat.  Neck with old scarring but no adenopathy.   Lungs: Clear to auscultation Heart: Regular rate and rhythm without murmurs Abdomen: Scaphoid but soft and active Extremities: Normal configuration Neurologic: Symmetric and intact.    Assessment/Plan Acquired vocal cord palsy (478.30) (J38.00).  Your voice is fading again.  We will repeat vocal cord injection when you return from [place].  You can go home the same day after your surgery.  recheck here 3 weeks post op.  Liquid hydrocodone is good for pain relief, but also for cough suppressant.  Microlaryngoscopy with vocal cord Radiesse injection was discussed in detail including risks and complications.  Questions were answered and informed consent was obtained.  Postoperative advancement of activity, diet, and voice use was discussed.  Lortab liquid prescription given for pain relief and also cough suppression as needed.  [name] 07/29/2012, 4:13 PM

## 2012-08-01 ENCOUNTER — Encounter (HOSPITAL_BASED_OUTPATIENT_CLINIC_OR_DEPARTMENT_OTHER): Payer: Self-pay | Admitting: Certified Registered"

## 2012-08-01 ENCOUNTER — Encounter (HOSPITAL_BASED_OUTPATIENT_CLINIC_OR_DEPARTMENT_OTHER): Payer: Self-pay | Admitting: *Deleted

## 2012-08-01 ENCOUNTER — Ambulatory Visit (HOSPITAL_BASED_OUTPATIENT_CLINIC_OR_DEPARTMENT_OTHER)
Admission: RE | Admit: 2012-08-01 | Discharge: 2012-08-01 | Disposition: A | Discharge status: Home / Self Care | Payer: Medicare Other | Source: Ambulatory Visit | Attending: Otolaryngology | Admitting: Otolaryngology

## 2012-08-01 ENCOUNTER — Encounter (HOSPITAL_BASED_OUTPATIENT_CLINIC_OR_DEPARTMENT_OTHER)
Admission: RE | Disposition: A | Discharge status: Home / Self Care | Payer: Self-pay | Source: Ambulatory Visit | Attending: Otolaryngology

## 2012-08-01 ENCOUNTER — Ambulatory Visit (HOSPITAL_BASED_OUTPATIENT_CLINIC_OR_DEPARTMENT_OTHER): Payer: Medicare Other | Admitting: Certified Registered"

## 2012-08-01 DIAGNOSIS — Z01810 Encounter for preprocedural cardiovascular examination: Secondary | ICD-10-CM | POA: Insufficient documentation

## 2012-08-01 DIAGNOSIS — J3800 Paralysis of vocal cords and larynx, unspecified: Secondary | ICD-10-CM | POA: Insufficient documentation

## 2012-08-01 DIAGNOSIS — Z01812 Encounter for preprocedural laboratory examination: Secondary | ICD-10-CM | POA: Insufficient documentation

## 2012-08-01 DIAGNOSIS — Z8501 Personal history of malignant neoplasm of esophagus: Secondary | ICD-10-CM | POA: Insufficient documentation

## 2012-08-01 DIAGNOSIS — Z8546 Personal history of malignant neoplasm of prostate: Secondary | ICD-10-CM | POA: Insufficient documentation

## 2012-08-01 DIAGNOSIS — J3801 Paralysis of vocal cords and larynx, unilateral: Secondary | ICD-10-CM

## 2012-08-01 HISTORY — DX: Dysphonia: R49.0

## 2012-08-01 HISTORY — DX: Unspecified abdominal pain: R10.9

## 2012-08-01 HISTORY — PX: MICROLARYNGOSCOPY W/VOCAL CORD INJECTION: SHX2665

## 2012-08-01 LAB — POCT I-STAT, CHEM 8
BUN: 16 mg/dL (ref 6–23)
Calcium, Ion: 1.23 mmol/L (ref 1.13–1.30)
Chloride: 105 mEq/L (ref 96–112)
Creatinine, Ser: 1 mg/dL (ref 0.50–1.35)
Glucose, Bld: 95 mg/dL (ref 70–99)
HCT: 43 % (ref 39.0–52.0)
Hemoglobin: 14.6 g/dL (ref 13.0–17.0)
Potassium: 4.5 mEq/L (ref 3.5–5.1)
Sodium: 143 mEq/L (ref 135–145)
TCO2: 27 mmol/L (ref 0–100)

## 2012-08-01 SURGERY — MICROLARYNGOSCOPY, WITH VOCAL CORD INJECTION
Anesthesia: General | Site: Throat | Laterality: Left | Wound class: Clean Contaminated

## 2012-08-01 MED ORDER — DEXAMETHASONE SODIUM PHOSPHATE 10 MG/ML IJ SOLN: 8.0000 mg | Freq: Once | INTRAMUSCULAR | Status: DC

## 2012-08-01 MED ORDER — LACTATED RINGERS IV SOLN
INTRAVENOUS | Status: DC
  Administered 2012-08-01: 07:00:00 via INTRAVENOUS

## 2012-08-01 MED ORDER — SUCCINYLCHOLINE CHLORIDE 20 MG/ML IJ SOLN
INTRAMUSCULAR | Status: DC | PRN
  Administered 2012-08-01: 100 mg via INTRAVENOUS

## 2012-08-01 MED ORDER — CEFAZOLIN SODIUM-DEXTROSE 2-3 GM-% IV SOLR
2.0000 g | Freq: Once | INTRAVENOUS | Status: AC
  Administered 2012-08-01: 2 g via INTRAVENOUS

## 2012-08-01 MED ORDER — ONDANSETRON HCL 4 MG PO TABS: 4.0000 mg | ORAL_TABLET | ORAL | Status: DC | PRN

## 2012-08-01 MED ORDER — FENTANYL CITRATE 0.05 MG/ML IJ SOLN
INTRAMUSCULAR | Status: DC | PRN
  Administered 2012-08-01: 50 ug via INTRAVENOUS

## 2012-08-01 MED ORDER — ONDANSETRON HCL 4 MG/2ML IJ SOLN: 4.0000 mg | INTRAMUSCULAR | Status: DC | PRN

## 2012-08-01 MED ORDER — PROPOFOL 10 MG/ML IV BOLUS
INTRAVENOUS | Status: DC | PRN
  Administered 2012-08-01: 120 mg via INTRAVENOUS

## 2012-08-01 MED ORDER — HYDROCODONE-ACETAMINOPHEN 7.5-325 MG/15ML PO SOLN: 10.0000 mL | Freq: Four times a day (QID) | ORAL | Status: DC | PRN

## 2012-08-01 MED ORDER — DEXAMETHASONE SODIUM PHOSPHATE 4 MG/ML IJ SOLN
INTRAMUSCULAR | Status: DC | PRN
  Administered 2012-08-01: 10 mg via INTRAVENOUS

## 2012-08-01 MED ORDER — MIDAZOLAM HCL 5 MG/5ML IJ SOLN
INTRAMUSCULAR | Status: DC | PRN
  Administered 2012-08-01: 1 mg via INTRAVENOUS

## 2012-08-01 MED ORDER — COCAINE HCL 4 % EX SOLN
CUTANEOUS | Status: DC | PRN
  Administered 2012-08-01: 2 mL via TOPICAL

## 2012-08-01 MED ORDER — FENTANYL CITRATE 0.05 MG/ML IJ SOLN: 25.0000 ug | INTRAMUSCULAR | Status: DC | PRN

## 2012-08-01 MED ORDER — ONDANSETRON HCL 4 MG/2ML IJ SOLN
INTRAMUSCULAR | Status: DC | PRN
  Administered 2012-08-01: 4 mg via INTRAVENOUS

## 2012-08-01 MED ORDER — LIDOCAINE HCL (CARDIAC) 20 MG/ML IV SOLN
INTRAVENOUS | Status: DC | PRN
  Administered 2012-08-01: 50 mg via INTRAVENOUS

## 2012-08-01 MED ORDER — DEXTROSE-NACL 5-0.9 % IV SOLN: INTRAVENOUS | Status: DC

## 2012-08-01 SURGICAL SUPPLY — 22 items
CANISTER SUCTION 1200CC (MISCELLANEOUS) ×2 IMPLANT
CLOTH BEACON ORANGE TIMEOUT ST (SAFETY) ×2 IMPLANT
GAUZE SPONGE 4X4 12PLY STRL LF (GAUZE/BANDAGES/DRESSINGS) IMPLANT
GLOVE ECLIPSE 8.0 STRL XLNG CF (GLOVE) ×2 IMPLANT
GOWN PREVENTION PLUS XLARGE (GOWN DISPOSABLE) ×1 IMPLANT
GOWN PREVENTION PLUS XXLARGE (GOWN DISPOSABLE) ×1 IMPLANT
GUARD TEETH (MISCELLANEOUS) ×1 IMPLANT
KIT PROLARN PLUS GEL W/NDL (Prosthesis and Implant ENT) ×1 IMPLANT
MARKER SKIN DUAL TIP RULER LAB (MISCELLANEOUS) IMPLANT
NDL SPNL 22GX7 QUINCKE BK (NEEDLE) IMPLANT
NDL TRANS ORAL INJECTION (NEEDLE) IMPLANT
NEEDLE SPNL 22GX7 QUINCKE BK (NEEDLE) IMPLANT
NEEDLE TRANS ORAL INJECTION (NEEDLE) ×2 IMPLANT
NS IRRIG 1000ML POUR BTL (IV SOLUTION) ×1 IMPLANT
PATTIES SURGICAL .5 X3 (DISPOSABLE) ×1 IMPLANT
SHEET MEDIUM DRAPE 40X70 STRL (DRAPES) ×2 IMPLANT
SLEEVE SCD COMPRESS KNEE MED (MISCELLANEOUS) ×2 IMPLANT
SOLUTION BUTLER CLEAR DIP (MISCELLANEOUS) ×2 IMPLANT
TOWEL OR 17X24 6PK STRL BLUE (TOWEL DISPOSABLE) ×2 IMPLANT
TOWEL OR NON WOVEN STRL DISP B (DISPOSABLE) ×2 IMPLANT
TUBE CONNECTING 20X1/4 (TUBING) ×2 IMPLANT
WATER STERILE IRR 1000ML POUR (IV SOLUTION) ×1 IMPLANT

## 2012-08-01 NOTE — Op Note (Signed)
08/01/2012  8:34 AM    Gwinda Maine  [id number]   Pre-Op Dx:  Left vocal cord paralysis.  Post-op Dx: Same  Proc: Microlaryngoscopy with bilateral Radiesse  vocal cord augmentation injection   Surg:  [name] T MD  Anes:  GOT  EBL:  None  Comp:  None  Findings:  Bowing and lateralization of the left vocal cord. Slight bowing of the right vocal cord  Procedure: With the patient in a comfortable supine position, general orotracheal anesthesia was induced without difficulty. At an appropriate level, the table was turned 90 the patient placed in a slight reverse Trendelenburg. A clean preparation and draping was accomplished. Taking care to protect lips, teeth, and endotracheal tube, the anterior commissure laryngoscope was introduced and placed in the supraglottis with good visualization of the cords. It was suspended in this position in the standard fashion. The findings were as described above.  4% cocaine solution was applied on cottonoids to the mucosa of the vocal cords for intraoperative hemostasis. Several minutes were allowed for this to take effect.  Using the long needle, Radiesse was injected into the vocalis muscle on both sides in multiple sites to ensure a smooth contour. Hemostasis was spontaneous. A suction tip was used to smooth the contour slightly.  At this point the procedure was completed. The laryngoscope was then suspended and removed. The dental guard was removed. Dental status was intact. Patient was returned to anesthesia, awakened, extubated, and transferred to recovery in stable condition.  Dispo:    PACU to home  Plan:   ice, analgesia/cough suppressant. Advance diet and activity. Recheck my office 3 weeks.  Cephus Richer MD

## 2012-08-01 NOTE — Anesthesia Preprocedure Evaluation (Signed)
Anesthesia Evaluation  Patient identified by MRN, date of birth, ID band Patient awake    Reviewed: Allergy & Precautions, H&P , NPO status , Patient's Chart, lab work & pertinent test results  Airway Mallampati: III TM Distance: >3 FB Neck ROM: Full  Mouth opening: Limited Mouth Opening  Dental No notable dental hx. (+) Teeth Intact and Dental Advisory Given   Pulmonary neg pulmonary ROS,  breath sounds clear to auscultation  Pulmonary exam normal       Cardiovascular negative cardio ROS  Rhythm:Regular Rate:Normal     Neuro/Psych negative neurological ROS  negative psych ROS   GI/Hepatic Neg liver ROS, GERD-  Medicated and Controlled,  Endo/Other  negative endocrine ROS  Renal/GU negative Renal ROS  negative genitourinary   Musculoskeletal   Abdominal   Peds  Hematology negative hematology ROS (+)   Anesthesia Other Findings   Reproductive/Obstetrics negative OB ROS                           Anesthesia Physical Anesthesia Plan  ASA: II  Anesthesia Plan: General   Post-op Pain Management:    Induction: Intravenous  Airway Management Planned: Oral ETT  Additional Equipment:   Intra-op Plan:   Post-operative Plan: Extubation in OR  Informed Consent: I have reviewed the patients History and Physical, chart, labs and discussed the procedure including the risks, benefits and alternatives for the proposed anesthesia with the patient or authorized representative who has indicated his/her understanding and acceptance.   Dental advisory given  Plan Discussed with: CRNA  Anesthesia Plan Comments:         Anesthesia Quick Evaluation

## 2012-08-01 NOTE — Interval H&P Note (Signed)
History and Physical Interval Note:  08/01/2012 7:49 AM  [name]  has presented today for surgery, with the diagnosis of LEFT VOCAL PARALYSIS  The various methods of treatment have been discussed with the patient and family. After consideration of risks, benefits and other options for treatment, the patient has consented to  Procedure(s) (LRB) with comments: MICROLARYNGOSCOPY WITH VOCAL CORD INJECTION (Left) - MICRO DIRECT LARYNGOSCOPY  AND RADIESSE  INJECTION  as a surgical intervention .  The patient's history has been re-reviewed, patient re-examined, no change in status, stable for surgery.  I have re-reviewed the patient's chart and labs.  Questions were answered to the patient's satisfaction.     [name]

## 2012-08-01 NOTE — Anesthesia Postprocedure Evaluation (Signed)
  Anesthesia Post-op Note  Patient: [name]  Procedure(s) Performed: Procedure(s) (LRB) with comments: MICROLARYNGOSCOPY WITH VOCAL CORD INJECTION (Left) - MICRO DIRECT LARYNGOSCOPY  AND RADIESSE  INJECTION   Patient Location: PACU  Anesthesia Type:General  Level of Consciousness: awake, alert  and oriented  Airway and Oxygen Therapy: Patient Spontanous Breathing  Post-op Pain: none  Post-op Assessment: Post-op Vital signs reviewed, Patient's Cardiovascular Status Stable, Respiratory Function Stable, Patent Airway and No signs of Nausea or vomiting  Post-op Vital Signs: Reviewed and stable  Complications: No apparent anesthesia complications

## 2012-08-01 NOTE — Transfer of Care (Signed)
Immediate Anesthesia Transfer of Care Note  Patient: [name]  Procedure(s) Performed: Procedure(s) (LRB) with comments: MICROLARYNGOSCOPY WITH VOCAL CORD INJECTION (Left) - MICRO DIRECT LARYNGOSCOPY  AND RADIESSE  INJECTION   Patient Location: PACU  Anesthesia Type:General  Level of Consciousness: awake, alert  and patient cooperative  Airway & Oxygen Therapy: Patient Spontanous Breathing and aerosol face mask  Post-op Assessment: Report given to PACU RN and Post -op Vital signs reviewed and stable  Post vital signs: Reviewed and stable  Complications: No apparent anesthesia complications

## 2012-08-01 NOTE — Anesthesia Procedure Notes (Signed)
Procedure Name: Intubation Date/Time: 08/01/2012 8:01 AM Performed by: Verlan Friends Pre-anesthesia Checklist: Patient identified, Emergency Drugs available, Suction available, Patient being monitored and Timeout performed Patient Re-evaluated:Patient Re-evaluated prior to inductionOxygen Delivery Method: Circle System Utilized Preoxygenation: Pre-oxygenation with 100% oxygen Intubation Type: IV induction Ventilation: Mask ventilation without difficulty Laryngoscope Size: Miller and 3 Grade View: Grade I Tube type: Oral Tube size: 5.5 mm Number of attempts: 1 Airway Equipment and Method: stylet and oral airway Placement Confirmation: ETT inserted through vocal cords under direct vision,  positive ETCO2 and breath sounds checked- equal and bilateral Tube secured with: Tape Dental Injury: Teeth and Oropharynx as per pre-operative assessment

## 2012-08-02 ENCOUNTER — Encounter (HOSPITAL_BASED_OUTPATIENT_CLINIC_OR_DEPARTMENT_OTHER): Payer: Self-pay | Admitting: Otolaryngology

## 2012-10-13 ENCOUNTER — Ambulatory Visit: Payer: Medicare Other | Admitting: Cardiothoracic Surgery

## 2012-10-20 ENCOUNTER — Ambulatory Visit (INDEPENDENT_AMBULATORY_CARE_PROVIDER_SITE_OTHER): Payer: Medicare Other | Admitting: Cardiothoracic Surgery

## 2012-10-20 ENCOUNTER — Encounter: Payer: Self-pay | Admitting: Cardiothoracic Surgery

## 2012-10-20 VITALS — BP 114/74 | HR 99 | Resp 16 | Ht 73.0 in | Wt 166.5 lb

## 2012-10-20 DIAGNOSIS — C159 Malignant neoplasm of esophagus, unspecified: Secondary | ICD-10-CM

## 2012-10-20 DIAGNOSIS — Z09 Encounter for follow-up examination after completed treatment for conditions other than malignant neoplasm: Secondary | ICD-10-CM

## 2012-10-20 NOTE — Progress Notes (Signed)
[address]            [place] [address]          [phone]          [name] [facility] Medical Record #[id number] Date of Birth: 1945/10/31  [name] Cranker, MD Daisy Floro, MD  Chief Complaint:   PostOp Follow Up Visit Transhiatal total esophagectomy with cervical esophagogastrostomy complicated by anastomotic leak and left recurrent nerve palsy by Dr. Edwyna Shell January 2012  History of Present Illness:     Patient returns today. He still has hoarseness and probably has a recurrent  laryngeal nerve palsy that is permanent. He is being followed by ENT for this with recent injection 08/01/2012 . His incisions are well-healed. He is doing well overall. He is maintaining his weight. Early after surgery he did have symptoms of dumping but notes that now this is well-controlled by diet manipulation, he continues to swallow without obvious difficulty        History  Smoking status  . Former Smoker  . Quit date: 06/13/2009  Smokeless tobacco  . Not on file       No Known Allergies  Current Outpatient Prescriptions  Medication Sig Dispense Refill  . aspirin 81 MG tablet Take 81 mg by mouth every other day.       . diphenoxylate-atropine (LOMOTIL) 2.5-0.025 MG per tablet Take 1 tablet by mouth as needed.       [name] esomeprazole (NEXIUM) 40 MG capsule Take 40 mg by mouth 2 (two) times daily.       . hydrocodone-acetaminophen (HYCET) 7.5-325 MG/15ML solution Take 10-20 mLs by mouth 4 (four) times daily as needed for pain.  1 mL  0  . HYDROcodone-acetaminophen (VICODIN) 5-500 MG per tablet Take 1 tablet by mouth every 6 (six) hours as needed.      . hyoscyamine (CYSTOSPAZ) 0.15 MG tablet Take 0.15 mg by mouth 1 day or 1 dose.      . zolpidem (AMBIEN) 10 MG tablet Take 10 mg by mouth at bedtime as needed.        Wt Readings from Last 3 Encounters:  10/20/12 166 lb 8 oz (75.524 kg)  08/01/12 164 lb (74.39 kg)  08/01/12  164 lb (74.39 kg)      Physical Exam: BP 114/74  Pulse 99  Resp 16  Ht 6\' 1"  (1.854 m)  Wt 166 lb 8 oz (75.524 kg)  BMI 21.97 kg/m2  SpO2 99%  General appearance: alert, cooperative and Worse while speaking Neurologic: intact Heart: regular rate and rhythm, S1, S2 normal, no murmur, click, rub or gallop and normal apical impulse Lungs: clear to auscultation bilaterally and normal percussion bilaterally Abdomen: soft, non-tender; bowel sounds normal; no masses,  no organomegaly Extremities: extremities normal, atraumatic, no cyanosis or edema, Homans sign is negative, no sign of DVT and no edema, redness or tenderness in the calves or thighs Wound: Well-healed No cervical nodes or mass noted  Diagnostic Studies & Laboratory data:         Recent Radiology Findings: No results found.    Recent Labs: Lab Results  Component Value Date   WBC 5.8 10/19/2010   HGB 14.6 08/01/2012   HCT 43.0 08/01/2012   PLT 389 10/19/2010   GLUCOSE 95 08/01/2012   ALT 34 10/19/2010  AST 32 10/19/2010   NA 143 08/01/2012   K 4.5 08/01/2012   CL 105 08/01/2012   CREATININE 1.00 08/01/2012   BUN 16 08/01/2012   CO2 26 10/19/2010   INR 0.89 09/28/2010      Assessment / Plan:      Patient now 18 months following transhiatal total esophagectomy for distal esophageal cancer. He was treated preoperatively with radiation and chemotherapy final path showed ulcerated gastric mucosa negative nodes ypTx,pN0,PMx.  Followup CT scan in fall  shows no evidence of recurrence  I plan to see the patient back in June of 2014, Will leave follow scans to Dr. Jetty Peeks B 10/20/2012 11:18 AM

## 2013-01-06 ENCOUNTER — Telehealth: Payer: Self-pay | Admitting: Oncology

## 2013-01-06 NOTE — Telephone Encounter (Signed)
Retrieved a forwarded message of [phone] from desk nurse Okey Dupre out) re pt wanting to confirm next appt. Returned call and lmonvm for pt confirming next appt for 6/2.

## 2013-01-20 ENCOUNTER — Other Ambulatory Visit: Payer: Self-pay | Admitting: Otolaryngology

## 2013-01-20 ENCOUNTER — Encounter (HOSPITAL_BASED_OUTPATIENT_CLINIC_OR_DEPARTMENT_OTHER): Payer: Self-pay | Admitting: *Deleted

## 2013-01-20 NOTE — H&P (Signed)
  [name],  [name] 67 y.o., male [id number]     Chief Complaint: LEFT vocal cord paralysis  HPI: 4 month recheck.  He was pleased with the last augmentation and felt like even when his voice did fatigue, it recovered more quickly.  No pain.  No breathing difficulty.  Overall, the voice is weakening once again.  No breathing or swallowing issues.  To his knowledge he remains cancer free.  PMH: Past Medical History  Diagnosis Date  . GERD (gastroesophageal reflux disease)   . Esophageal cancer     and prostate  . Hoarseness   . Abdominal discomfort   . Dysrhythmia     occ pac    Surg Hx: Past Surgical History  Procedure Laterality Date  . Esophagectomy  10/02/2010    Dr.Burney  . Pyloroplasty  09/29/2010    Dr.Burney  . Jejunostomy  09/29/2010    Dr.Burney  . Direct laryngoscopy with radiaesse injection  02/08/2012    Procedure: DIRECT LARYNGOSCOPY WITH RADIAESSE INJECTION;  Surgeon: Shadie Sweatman, MD;  Location: [facility];  Service: ENT;  Laterality: Bilateral;  microdirect-laryngosocopy  with bilateral vocal cord radiesse injection  . Direct laryngoscopy with radiaesse injection  03/21/2012    Procedure: DIRECT LARYNGOSCOPY WITH RADIAESSE INJECTION;  Surgeon: Israella Hubert, MD;  Location: [facility];  Service: ENT;  Laterality: N/A;  microdirect laryngoscopy with radiaesse injection left vocal cord  . Esophagoscopy  03/21/2012    Procedure: ESOPHAGOSCOPY;  Surgeon: Ashaya Raftery, MD;  Location: [facility];  Service: ENT;  Laterality: N/A;  . Microlaryngoscopy w/vocal cord injection  08/01/2012    Procedure: MICROLARYNGOSCOPY WITH VOCAL CORD INJECTION;  Surgeon: Jacy Howat, MD;  Location: [facility];  Service: ENT;  Laterality: Left;  MICRO DIRECT LARYNGOSCOPY  AND RADIESSE  INJECTION     FHx:  No family history on file. SocHx:  reports that he quit smoking about 3 years ago. He does not have any smokeless tobacco  history on file. He reports that he does not drink alcohol or use illicit drugs.  ALLERGIES: No Known Allergies   (Not in a hospital admission)  No results found for this or any previous visit (from the past 48 hour(s)). No results found.    There were no vitals taken for this visit.  PHYSICAL EXAM: He is thin.  Mental status is sharp.  His voice is rather raspy.  Breathing unlabored.  Ears are clear.  Anterior nose is moist and patent.  Oral cavity is moist with teeth in good repair.  Oropharynx clear.  I got an incomplete view of the larynx with mirror examination.  Neck without adenopathy.   Lungs: Clear to auscultation Heart: Regular rate and rhythm Abdomen: Soft, active Extremities: Normal configuration Neurologic: Symmetric and intact.    Assessment/Plan  Vocal cord paralysis, unilateral complete (478.32) (J38.01).  We are fine to go ahead with a repeat augmentation injection at your election.  I will see you back your 3 weeks after the surgery.  Remember to mention that possible cardiac arrhythmia when you speak with the anesthesiologist before the surgery.  Liquid hydrocodone for pain relief and also for cough suppression as needed.  Hydrocodone-Acetaminophen 7.5-325 MG/15ML Oral Solution;10-20 ml po q4h prn pain; Qty300; R2; Rx.  Gibril Mastro 01/20/2013, 5:55 PM     

## 2013-01-20 NOTE — Progress Notes (Signed)
Has been here multiple times for this procedure-states he had some PAC last time-may have not been eating well or K low-no labs needed. Told to hydrate well over weekend and drink juice-may want K ck preop.

## 2013-01-23 ENCOUNTER — Encounter (HOSPITAL_BASED_OUTPATIENT_CLINIC_OR_DEPARTMENT_OTHER): Payer: Self-pay | Admitting: Anesthesiology

## 2013-01-23 ENCOUNTER — Encounter (HOSPITAL_BASED_OUTPATIENT_CLINIC_OR_DEPARTMENT_OTHER): Payer: Self-pay | Admitting: *Deleted

## 2013-01-23 ENCOUNTER — Encounter (HOSPITAL_BASED_OUTPATIENT_CLINIC_OR_DEPARTMENT_OTHER)
Admission: RE | Disposition: A | Discharge status: Home / Self Care | Payer: Self-pay | Source: Ambulatory Visit | Attending: Otolaryngology

## 2013-01-23 ENCOUNTER — Ambulatory Visit (HOSPITAL_BASED_OUTPATIENT_CLINIC_OR_DEPARTMENT_OTHER)
Admission: RE | Admit: 2013-01-23 | Discharge: 2013-01-23 | Disposition: A | Discharge status: Home / Self Care | Payer: Medicare Other | Source: Ambulatory Visit | Attending: Otolaryngology | Admitting: Otolaryngology

## 2013-01-23 ENCOUNTER — Ambulatory Visit (HOSPITAL_BASED_OUTPATIENT_CLINIC_OR_DEPARTMENT_OTHER): Payer: Medicare Other | Admitting: Anesthesiology

## 2013-01-23 DIAGNOSIS — I491 Atrial premature depolarization: Secondary | ICD-10-CM | POA: Insufficient documentation

## 2013-01-23 DIAGNOSIS — J3801 Paralysis of vocal cords and larynx, unilateral: Secondary | ICD-10-CM | POA: Insufficient documentation

## 2013-01-23 DIAGNOSIS — Z9089 Acquired absence of other organs: Secondary | ICD-10-CM | POA: Insufficient documentation

## 2013-01-23 DIAGNOSIS — Z8501 Personal history of malignant neoplasm of esophagus: Secondary | ICD-10-CM | POA: Insufficient documentation

## 2013-01-23 DIAGNOSIS — K219 Gastro-esophageal reflux disease without esophagitis: Secondary | ICD-10-CM | POA: Insufficient documentation

## 2013-01-23 DIAGNOSIS — Z87891 Personal history of nicotine dependence: Secondary | ICD-10-CM | POA: Insufficient documentation

## 2013-01-23 HISTORY — DX: Cardiac arrhythmia, unspecified: I49.9

## 2013-01-23 HISTORY — PX: DIRECT LARYNGOSCOPY WITH RADIAESSE INJECTION: SHX5328

## 2013-01-23 SURGERY — LARYNGOSCOPY, DIRECT, WITH CALCIUM HYDROXYLAPATITE MICROSPHERE INJECTION
Anesthesia: General | Site: Throat | Wound class: Clean Contaminated

## 2013-01-23 MED ORDER — FENTANYL CITRATE 0.05 MG/ML IJ SOLN: 25.0000 ug | INTRAMUSCULAR | Status: DC | PRN

## 2013-01-23 MED ORDER — DEXAMETHASONE SODIUM PHOSPHATE 4 MG/ML IJ SOLN
INTRAMUSCULAR | Status: DC | PRN
  Administered 2013-01-23: 10 mg via INTRAVENOUS

## 2013-01-23 MED ORDER — DEXTROSE-NACL 5-0.45 % IV SOLN: INTRAVENOUS | Status: DC

## 2013-01-23 MED ORDER — ONDANSETRON HCL 4 MG PO TABS: 4.0000 mg | ORAL_TABLET | ORAL | Status: DC | PRN

## 2013-01-23 MED ORDER — ACETAMINOPHEN 10 MG/ML IV SOLN
1000.0000 mg | Freq: Once | INTRAVENOUS | Status: AC
  Administered 2013-01-23: 1000 mg via INTRAVENOUS

## 2013-01-23 MED ORDER — ONDANSETRON HCL 4 MG/2ML IJ SOLN: 4.0000 mg | INTRAMUSCULAR | Status: DC | PRN

## 2013-01-23 MED ORDER — OXYCODONE HCL 5 MG PO TABS: 5.0000 mg | ORAL_TABLET | Freq: Once | ORAL | Status: DC | PRN

## 2013-01-23 MED ORDER — MIDAZOLAM HCL 2 MG/2ML IJ SOLN: 0.5000 mg | Freq: Once | INTRAMUSCULAR | Status: DC | PRN

## 2013-01-23 MED ORDER — MEPERIDINE HCL 25 MG/ML IJ SOLN: 6.2500 mg | INTRAMUSCULAR | Status: DC | PRN

## 2013-01-23 MED ORDER — ONDANSETRON HCL 4 MG/2ML IJ SOLN
INTRAMUSCULAR | Status: DC | PRN
  Administered 2013-01-23: 4 mg via INTRAVENOUS

## 2013-01-23 MED ORDER — COCAINE HCL 4 % EX SOLN
CUTANEOUS | Status: DC | PRN
  Administered 2013-01-23: 1 mL via TOPICAL

## 2013-01-23 MED ORDER — LACTATED RINGERS IV SOLN
INTRAVENOUS | Status: DC
  Administered 2013-01-23 (×2): via INTRAVENOUS

## 2013-01-23 MED ORDER — PROMETHAZINE HCL 25 MG/ML IJ SOLN: 6.2500 mg | INTRAMUSCULAR | Status: DC | PRN

## 2013-01-23 MED ORDER — SUCCINYLCHOLINE CHLORIDE 20 MG/ML IJ SOLN
INTRAMUSCULAR | Status: DC | PRN
  Administered 2013-01-23: 100 mg via INTRAVENOUS

## 2013-01-23 MED ORDER — HYDROCODONE-ACETAMINOPHEN 7.5-325 MG/15ML PO SOLN: 10.0000 mL | ORAL | Status: DC | PRN

## 2013-01-23 MED ORDER — LIDOCAINE HCL (CARDIAC) 20 MG/ML IV SOLN
INTRAVENOUS | Status: DC | PRN
  Administered 2013-01-23: 40 mg via INTRAVENOUS

## 2013-01-23 MED ORDER — MIDAZOLAM HCL 5 MG/5ML IJ SOLN
INTRAMUSCULAR | Status: DC | PRN
  Administered 2013-01-23: 2 mg via INTRAVENOUS

## 2013-01-23 MED ORDER — OXYCODONE HCL 5 MG/5ML PO SOLN: 5.0000 mg | Freq: Once | ORAL | Status: DC | PRN

## 2013-01-23 MED ORDER — PROPOFOL 10 MG/ML IV BOLUS
INTRAVENOUS | Status: DC | PRN
  Administered 2013-01-23: 130 mg via INTRAVENOUS

## 2013-01-23 MED ORDER — PROPOFOL INFUSION 10 MG/ML OPTIME
INTRAVENOUS | Status: DC | PRN
  Administered 2013-01-23: 140 ug/kg/min via INTRAVENOUS

## 2013-01-23 MED ORDER — FENTANYL CITRATE 0.05 MG/ML IJ SOLN
INTRAMUSCULAR | Status: DC | PRN
  Administered 2013-01-23: 100 ug via INTRAVENOUS

## 2013-01-23 SURGICAL SUPPLY — 22 items
CANISTER SUCTION 1200CC (MISCELLANEOUS) ×2 IMPLANT
CLOTH BEACON ORANGE TIMEOUT ST (SAFETY) ×2 IMPLANT
GAUZE SPONGE 4X4 12PLY STRL LF (GAUZE/BANDAGES/DRESSINGS) ×1 IMPLANT
GLOVE BIOGEL M 7.0 STRL (GLOVE) ×1 IMPLANT
GLOVE ECLIPSE 8.0 STRL XLNG CF (GLOVE) ×3 IMPLANT
GOWN PREVENTION PLUS XLARGE (GOWN DISPOSABLE) ×2 IMPLANT
GOWN PREVENTION PLUS XXLARGE (GOWN DISPOSABLE) ×2 IMPLANT
GUARD TEETH (MISCELLANEOUS) ×1 IMPLANT
KIT PROLARN PLUS GEL W/NDL (Prosthesis and Implant ENT) ×1 IMPLANT
MARKER SKIN DUAL TIP RULER LAB (MISCELLANEOUS) ×1 IMPLANT
NDL SPNL 22GX7 QUINCKE BK (NEEDLE) IMPLANT
NDL TRANS ORAL INJECTION (NEEDLE) IMPLANT
NEEDLE SPNL 22GX7 QUINCKE BK (NEEDLE) IMPLANT
NEEDLE TRANS ORAL INJECTION (NEEDLE) ×2 IMPLANT
NS IRRIG 1000ML POUR BTL (IV SOLUTION) ×1 IMPLANT
PATTIES SURGICAL .5 X3 (DISPOSABLE) ×1 IMPLANT
SHEET MEDIUM DRAPE 40X70 STRL (DRAPES) ×2 IMPLANT
SLEEVE SCD COMPRESS KNEE MED (MISCELLANEOUS) ×2 IMPLANT
SOLUTION BUTLER CLEAR DIP (MISCELLANEOUS) ×2 IMPLANT
TOWEL OR 17X24 6PK STRL BLUE (TOWEL DISPOSABLE) ×2 IMPLANT
TOWEL OR NON WOVEN STRL DISP B (DISPOSABLE) ×2 IMPLANT
TUBE CONNECTING 20X1/4 (TUBING) ×2 IMPLANT

## 2013-01-23 NOTE — H&P (View-Only) (Signed)
  [name], [name] 67 y.o., male [id number]     Chief Complaint: LEFT vocal cord paralysis  HPI: 4 month recheck.  He was pleased with the last augmentation and felt like even when his voice did fatigue, it recovered more quickly.  No pain.  No breathing difficulty.  Overall, the voice is weakening once again.  No breathing or swallowing issues.  To his knowledge he remains cancer free.  PMH: Past Medical History  Diagnosis Date  . GERD (gastroesophageal reflux disease)   . Esophageal cancer     and prostate  . Hoarseness   . Abdominal discomfort   . Dysrhythmia     occ pac    Surg Hx: Past Surgical History  Procedure Laterality Date  . Esophagectomy  10/02/2010    Dr.Burney  . Pyloroplasty  09/29/2010    Dr.Burney  . Jejunostomy  09/29/2010    Dr.Burney  . Direct laryngoscopy with radiaesse injection  02/08/2012    Procedure: DIRECT LARYNGOSCOPY WITH RADIAESSE INJECTION;  Surgeon: [name], MD;  Location: [facility];  Service: ENT;  Laterality: Bilateral;  microdirect-laryngosocopy  with bilateral vocal cord radiesse injection  . Direct laryngoscopy with radiaesse injection  03/21/2012    Procedure: DIRECT LARYNGOSCOPY WITH RADIAESSE INJECTION;  Surgeon: [name], MD;  Location: [facility];  Service: ENT;  Laterality: N/A;  microdirect laryngoscopy with radiaesse injection left vocal cord  . Esophagoscopy  03/21/2012    Procedure: ESOPHAGOSCOPY;  Surgeon: [name], MD;  Location: [facility];  Service: ENT;  Laterality: N/A;  . Microlaryngoscopy w/vocal cord injection  08/01/2012    Procedure: MICROLARYNGOSCOPY WITH VOCAL CORD INJECTION;  Surgeon: [name], MD;  Location: [facility];  Service: ENT;  Laterality: Left;  MICRO DIRECT LARYNGOSCOPY  AND RADIESSE  INJECTION     FHx:  No family history on file. SocHx:  reports that he quit smoking about 3 years ago. He does not have any smokeless tobacco  history on file. He reports that he does not drink alcohol or use illicit drugs.  ALLERGIES: No Known Allergies   (Not in a hospital admission)  No results found for this or any previous visit (from the past 48 hour(s)). No results found.    There were no vitals taken for this visit.  PHYSICAL EXAM: He is thin.  Mental status is sharp.  His voice is rather raspy.  Breathing unlabored.  Ears are clear.  Anterior nose is moist and patent.  Oral cavity is moist with teeth in good repair.  Oropharynx clear.  I got an incomplete view of the larynx with mirror examination.  Neck without adenopathy.   Lungs: Clear to auscultation Heart: Regular rate and rhythm Abdomen: Soft, active Extremities: Normal configuration Neurologic: Symmetric and intact.    Assessment/Plan  Vocal cord paralysis, unilateral complete (478.32) (J38.01).  We are fine to go ahead with a repeat augmentation injection at your election.  I will see you back your 3 weeks after the surgery.  Remember to mention that possible cardiac arrhythmia when you speak with the anesthesiologist before the surgery.  Liquid hydrocodone for pain relief and also for cough suppression as needed.  Hydrocodone-Acetaminophen 7.5-325 MG/15ML Oral Solution;10-20 ml po q4h prn pain; Qty300; R2; Rx.  [name], [name] 01/20/2013, 5:55 PM

## 2013-01-23 NOTE — Anesthesia Procedure Notes (Signed)
Procedure Name: Intubation Date/Time: 01/23/2013 7:51 AM Performed by: Burna Cash Pre-anesthesia Checklist: Patient identified, Emergency Drugs available, Suction available and Patient being monitored Patient Re-evaluated:Patient Re-evaluated prior to inductionOxygen Delivery Method: Circle System Utilized Preoxygenation: Pre-oxygenation with 100% oxygen Intubation Type: IV induction Ventilation: Mask ventilation without difficulty Laryngoscope Size: Mac and 3 Grade View: Grade II Tube type: Oral Tube size: 6.0 mm Number of attempts: 1 Airway Equipment and Method: stylet and oral airway Placement Confirmation: ETT inserted through vocal cords under direct vision,  positive ETCO2 and breath sounds checked- equal and bilateral Secured at: 22 cm Tube secured with: Tape Dental Injury: Teeth and Oropharynx as per pre-operative assessment

## 2013-01-23 NOTE — Op Note (Signed)
01/23/2013  8:24 AM    [name]  [id number]   Pre-Op Dx:  Left vocal cord paralysis  Post-op Dx: Same  Proc: Microlaryngoscopy with Radiesse vocal cord augmentation, bilateral   Surg:  [name] T MD  Anes:  GOT  EBL:  None  Comp:  None  Findings:  Atrophic and bowed left vocal cord. Slight bowing of the right vocal cord.  Procedure: With the patient in a comfortable supine position, general orotracheal anesthesia was induced without difficulty. At an appropriate level, the table was turned 90 the patient placed in a slight reverse Trendelenburg. A rubber tooth guard was applied. Taking care to protect lips, teeth, and endotracheal tube, the anterior commissure laryngoscope was introduced, passed beneath the epiglottis with good access and visualization of the glottis proper. It was suspended in this position.  4% cocaine solution was applied on cotton pledgets to the mucosa of the vocal cords for intraoperative hemostasis. Several minutes were allowed for this to take effect.  Pledget was removed. Findings were as described above. Radiesse solution was applied on a long needle into the muscularis of the left vocal cord achieving a slight overcorrection and a smooth contour. Right vocal cord was also augmented but to a lesser degree. Hemostasis was observed.  4% cocaine solution was applied once again to prevent postoperative coughing. Again several minutes were allowed for this to take effect. The pledget was removed.  At this point the procedure was completed. The laryngoscope was then suspended and removed. The tooth guard was removed. The dental status was intact. Patient was returned to anesthesia, awakened, extubated, and transferred to recovery in stable condition.     Dispo:   PACU to home   Plan:  Analgesia, narcotics for pain relief and cough suppression as needed, advancement of diet and activity as tolerated. Recheck my office 3 weeks.   Cephus Richer MD

## 2013-01-23 NOTE — Anesthesia Postprocedure Evaluation (Signed)
  Anesthesia Post-op Note  Patient: [name]  Procedure(s) Performed: Procedure(s): DIRECT LARYNGOSCOPY WITH RADIESSE VOCAL CORD AUGMENTATION LARYNGOPLASTY (N/A)  Patient Location: PACU  Anesthesia Type:General  Level of Consciousness: awake, alert , oriented and patient cooperative  Airway and Oxygen Therapy: Patient Spontanous Breathing  Post-op Pain: none  Post-op Assessment: Post-op Vital signs reviewed, Patient's Cardiovascular Status Stable, Respiratory Function Stable, Patent Airway, No signs of Nausea or vomiting and Pain level controlled  Post-op Vital Signs: Reviewed and stable  Complications: No apparent anesthesia complications

## 2013-01-23 NOTE — Anesthesia Preprocedure Evaluation (Addendum)
Anesthesia Evaluation  Patient identified by MRN, date of birth, ID band Patient awake    Reviewed: Allergy & Precautions, H&P , NPO status , Patient's Chart, lab work & pertinent test results  History of Anesthesia Complications Negative for: history of anesthetic complications  Airway Mallampati: II TM Distance: >3 FB Neck ROM: Full    Dental  (+) Teeth Intact, Dental Advisory Given and Caps   Pulmonary former smoker (quit 2011),  breath sounds clear to auscultation  Pulmonary exam normal       Cardiovascular negative cardio ROS  - dysrhythmias (occas PAC) Rhythm:Regular Rate:Normal     Neuro/Psych negative neurological ROS  negative psych ROS   GI/Hepatic Neg liver ROS, GERD-  Controlled,Esophageal cancer: s/p esophagogastrectomy, chemo, and XRT   Endo/Other  negative endocrine ROS  Renal/GU negative Renal ROS     Musculoskeletal   Abdominal   Peds  Hematology   Anesthesia Other Findings   Reproductive/Obstetrics                         Anesthesia Physical Anesthesia Plan  ASA: III  Anesthesia Plan: General   Post-op Pain Management:    Induction: Intravenous  Airway Management Planned: Oral ETT  Additional Equipment:   Intra-op Plan:   Post-operative Plan: Extubation in OR  Informed Consent: I have reviewed the patients History and Physical, chart, labs and discussed the procedure including the risks, benefits and alternatives for the proposed anesthesia with the patient or authorized representative who has indicated his/her understanding and acceptance.   Dental advisory given  Plan Discussed with: Surgeon and CRNA  Anesthesia Plan Comments: (Plan routine monitors, GETA)        Anesthesia Quick Evaluation

## 2013-01-23 NOTE — Transfer of Care (Signed)
Immediate Anesthesia Transfer of Care Note  Patient: [name]  Procedure(s) Performed: Procedure(s): DIRECT LARYNGOSCOPY WITH RADIESSE VOCAL CORD AUGMENTATION LARYNGOPLASTY (N/A)  Patient Location: PACU  Anesthesia Type:General  Level of Consciousness: awake, alert  and oriented  Airway & Oxygen Therapy: Patient Spontanous Breathing and Patient connected to face mask oxygen  Post-op Assessment: Report given to PACU RN and Post -op Vital signs reviewed and stable  Post vital signs: Reviewed and stable  Complications: No apparent anesthesia complications

## 2013-01-23 NOTE — Interval H&P Note (Signed)
History and Physical Interval Note:  01/23/2013 7:39 AM  [name]  has presented today for surgery, with the diagnosis of LEFT VOCAL CORD PARALYSIS  The various methods of treatment have been discussed with the patient and family. After consideration of risks, benefits and other options for treatment, the patient has consented to  Procedure(s): DIRECT LARYNGOSCOPY WITH RADIESSE VOCAL CORD AUGMENTATION LARYNGOPLASTY (Left) as a surgical intervention .  The patient's history has been re-reviewed, patient re-examined, no change in status, stable for surgery.  I have re-reviewed the patient's chart and labs.  Questions were answered to the patient's satisfaction.     [name]

## 2013-01-24 ENCOUNTER — Encounter (HOSPITAL_BASED_OUTPATIENT_CLINIC_OR_DEPARTMENT_OTHER): Payer: Self-pay | Admitting: Otolaryngology

## 2013-01-24 LAB — POCT HEMOGLOBIN-HEMACUE: Hemoglobin: 11.5 g/dL — ABNORMAL LOW (ref 13.0–17.0)

## 2013-02-20 ENCOUNTER — Ambulatory Visit (HOSPITAL_BASED_OUTPATIENT_CLINIC_OR_DEPARTMENT_OTHER): Payer: Medicare Other | Admitting: Oncology

## 2013-02-20 ENCOUNTER — Telehealth: Payer: Self-pay | Admitting: Oncology

## 2013-02-20 VITALS — BP 110/68 | HR 79 | Temp 97.8°F | Resp 18 | Ht 73.0 in | Wt 170.4 lb

## 2013-02-20 DIAGNOSIS — C159 Malignant neoplasm of esophagus, unspecified: Secondary | ICD-10-CM

## 2013-02-20 DIAGNOSIS — R109 Unspecified abdominal pain: Secondary | ICD-10-CM

## 2013-02-20 DIAGNOSIS — C155 Malignant neoplasm of lower third of esophagus: Secondary | ICD-10-CM

## 2013-02-20 DIAGNOSIS — Z8546 Personal history of malignant neoplasm of prostate: Secondary | ICD-10-CM

## 2013-02-20 DIAGNOSIS — R1310 Dysphagia, unspecified: Secondary | ICD-10-CM

## 2013-02-20 DIAGNOSIS — C779 Secondary and unspecified malignant neoplasm of lymph node, unspecified: Secondary | ICD-10-CM

## 2013-02-20 NOTE — Telephone Encounter (Signed)
gv and printed appt sched and avs for pt  °

## 2013-02-20 NOTE — Progress Notes (Signed)
   [facility]    OFFICE PROGRESS NOTE   INTERVAL HISTORY:   Scheduled feels well. He continues to postprandial upper abdominal pain and solid dysphagia. She takes hydrocodone as needed for the pain. He continues to undergo management of hoarseness with Dr. [name] and underwent a vocal cord augmentation procedure on 01/23/2013.  Objective:  Vital signs in last 24 hours:  Blood pressure 110/68, pulse 79, temperature 97.8 F (36.6 C), temperature source Oral, resp. rate 18, height 6\' 1"  (1.854 m), weight 170 lb 6.4 oz (77.293 kg).    HEENT: Neck without mass Lymphatics: No cervical, supraclavicular, or axillary nodes Resp: Lungs clear bilaterally Cardio: Regular rate and rhythm GI: No hepatomegaly, nontender, no mass Vascular: No leg edema   Medications: I have reviewed the patient's current medications.  Assessment/Plan: 1. 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  a. Staging CT/PET scan evaluation showed evidence of local metastatic disease involving lymph nodes but no distant metastases. b. Status post weekly Taxol/carboplatin chemotherapy and concurrent radiation with the last week of chemotherapy given on 08/12/2010 and radiation completed 08/18/2010.  c. Restaging PET scan December 2011 revealed an interval decrease in the size of metabolic activity of the primary esophagus tumor. No evidence of hypermetabolic lymph node or distant metastases.  d. Esophagogastrectomy 09/29/2010 with no residual carcinoma identified. e. Restaging CT scans chest and abdomen 11/17/2011 negative for recurrent/metastatic disease. f. Restaging CT scan 06/09/2012 negative for recurrent esophagus cancer 2. History of stage T1C prostate cancer. He continues an observation approach. He is followed by Dr. Isabel Caprice. 3. History of hypertension. 4. History of dysphagia and weight loss secondary to the  obstructing esophagus mass. His weight is stable. He will follow up with Dr. Madilyn Fireman to evaluate the dysphagia. 5. History of vesicular lesions at the right pubic area with the appearance of herpetic lesions. He was treated with Valtrex. 6. Postoperative anastomotic leak, resolved. 7. Hoarseness, followed by Dr. [name] with repeat focal cord injection therapy, last on 01/23/2013  8. Abdominal discomfort and diarrhea. Controlled with a combination of hydrocodone, Levsin and Nexium.  Disposition:  He appears stable. He remains in clinical remission from esophagus cancer. Mr. Dolman will see Dr. Madilyn Fireman to discuss the solid dysphagia. He will return for an office visit in 6 months. He continues clinical followup with Dr. Tyrone Sage.   Thornton Papas, MD  02/20/2013  11:18 AM

## 2013-04-13 ENCOUNTER — Encounter: Payer: Self-pay | Admitting: Cardiothoracic Surgery

## 2013-04-13 ENCOUNTER — Ambulatory Visit (INDEPENDENT_AMBULATORY_CARE_PROVIDER_SITE_OTHER): Payer: Medicare Other | Admitting: Cardiothoracic Surgery

## 2013-04-13 VITALS — BP 128/66 | HR 86 | Resp 20 | Ht 73.0 in | Wt 170.0 lb

## 2013-04-13 DIAGNOSIS — Z8501 Personal history of malignant neoplasm of esophagus: Secondary | ICD-10-CM

## 2013-04-13 NOTE — Progress Notes (Signed)
[address]       [place] [address]             [phone]                                        [facility] Medical Record #[id number] Date of Birth: Oct 25, 1945  Gildardo Cranker, MD Daisy Floro, MD  Chief Complaint:   PostOp Follow Up Visit Transhiatal total esophagectomy with cervical esophagogastrostomy complicated by anastomotic leak and left recurrent nerve palsy by Dr. Edwyna Shell January 2012 ULCERATED AND REACTIVE GASTRIC OXYNTIC MUCOSA; YPTX, PN0, PMX  History of Present Illness:     Patient returns today. He still has hoarseness and probably has a recurrent  laryngeal nerve palsy that is permanent. He is being followed by ENT for this with recent injection 08/01/2012 . His incisions are well-healed. He is doing well overall. He is maintaining his weight. Early after surgery he did have symptoms of dumping but notes that now this is well-controlled by diet manipulation. Just returned from cruise in [place], tolerated meals better. Still eats 4-5 meals a day. No dumping symptoms.      History  Smoking status  . Former Smoker  . Quit date: 06/13/2009  Smokeless tobacco  . Not on file       No Known Allergies  Current Outpatient Prescriptions  Medication Sig Dispense Refill  . aspirin 81 MG tablet Take 81 mg by mouth every other day.       . esomeprazole (NEXIUM) 40 MG capsule Take 40 mg by mouth 2 (two) times daily.       . hydrocodone-acetaminophen (HYCET) 7.5-325 MG/15ML solution Take 10-20 mLs by mouth 4 (four) times daily as needed for pain.  1 mL  0  . HYDROcodone-acetaminophen (VICODIN) 5-500 MG per tablet Take 1 tablet by mouth every 6 (six) hours as needed.      . hyoscyamine (CYSTOSPAZ) 0.15 MG tablet Take 0.15 mg by mouth 1 day or 1 dose.      . Multiple Vitamins-Minerals (MULTIVITAMIN WITH MINERALS) tablet Take 1 tablet by mouth daily.      [name] zolpidem (AMBIEN) 10 MG tablet Take 10 mg by mouth at bedtime as  needed.        No current facility-administered medications for this visit.   Wt Readings from Last 3 Encounters:  04/13/13 170 lb (77.111 kg)  02/20/13 170 lb 6.4 oz (77.293 kg)  01/23/13 172 lb 3.2 oz (78.109 kg)      Physical Exam: BP 128/66  Pulse 86  Resp 20  Ht 6\' 1"  (1.854 m)  Wt 170 lb (77.111 kg)  BMI 22.43 kg/m2  SpO2 96%  General appearance: alert, cooperative and Worse while speaking Neurologic: intact Heart: regular rate and rhythm, S1, S2 normal, no murmur, click, rub or gallop and normal apical impulse Lungs: clear to auscultation bilaterally and normal percussion bilaterally Abdomen: soft, non-tender; bowel sounds normal; no masses,  no organomegaly Extremities: extremities normal, atraumatic, no cyanosis or edema, Homans sign is negative, no sign of DVT and no edema, redness or tenderness in the calves or thighs Wound: Well-healed No cervical nodes or mass noted  Diagnostic Studies & Laboratory data:         Recent Radiology Findings: No results found.    Recent Labs: Lab Results  Component Value Date  WBC 5.8 10/19/2010   HGB 11.5* 01/23/2013   HCT 43.0 08/01/2012   PLT 389 10/19/2010   GLUCOSE 95 08/01/2012   ALT 34 10/19/2010   AST 32 10/19/2010   NA 143 08/01/2012   K 4.5 08/01/2012   CL 105 08/01/2012   CREATININE 1.00 08/01/2012   BUN 16 08/01/2012   CO2 26 10/19/2010   INR 0.89 09/28/2010      Assessment / Plan:      Patient now 26 months months following transhiatal total esophagectomy for distal esophageal cancer. He was treated preoperatively with radiation and chemotherapy final path showed ulcerated gastric mucosa negative nodes ypTx,pN0,PMx.    I plan to see the patient back in march 2015     Keilin Gamboa B 04/13/2013 12:21 PM

## 2013-08-03 ENCOUNTER — Encounter (HOSPITAL_BASED_OUTPATIENT_CLINIC_OR_DEPARTMENT_OTHER): Payer: Self-pay | Admitting: *Deleted

## 2013-08-03 ENCOUNTER — Other Ambulatory Visit (HOSPITAL_COMMUNITY): Payer: Self-pay | Admitting: Otolaryngology

## 2013-08-03 NOTE — Progress Notes (Signed)
Pt has been here several time same procedure

## 2013-08-03 NOTE — H&P (Signed)
[name], [name] 67 y.o., male [id number]     Chief Complaint: Hoarseness  HPI: 5 months recheck.  Known LEFT vocal cord paralysis s/p esophagectomy.  No cancer recurrence now 2 + yrs.  Prior vocal cord augmentation procedures done successfully.   Preoperative visit.  His voice is starting to fade again.  He is breathing comfortably.  No swallowing issues including aspiration.  No pain.  No neck masses.  To his knowledge, the esophageal cancer is gone.  PMH: Past Medical History  Diagnosis Date  . GERD (gastroesophageal reflux disease)   . Esophageal cancer     and prostate  . Hoarseness   . Abdominal discomfort   . Dysrhythmia     occ pac    Surg Hx: Past Surgical History  Procedure Laterality Date  . Esophagectomy  10/02/2010    Dr.Burney  . Pyloroplasty  09/29/2010    Dr.Burney  . Jejunostomy  09/29/2010    Dr.Burney  . Direct laryngoscopy with radiaesse injection  02/08/2012    Procedure: DIRECT LARYNGOSCOPY WITH RADIAESSE INJECTION;  Surgeon: [name], MD;  Location: [facility];  Service: ENT;  Laterality: Bilateral;  microdirect-laryngosocopy  with bilateral vocal cord radiesse injection  . Direct laryngoscopy with radiaesse injection  03/21/2012    Procedure: DIRECT LARYNGOSCOPY WITH RADIAESSE INJECTION;  Surgeon: [name], MD;  Location: [facility];  Service: ENT;  Laterality: N/A;  microdirect laryngoscopy with radiaesse injection left vocal cord  . Esophagoscopy  03/21/2012    Procedure: ESOPHAGOSCOPY;  Surgeon: [name], MD;  Location: [facility];  Service: ENT;  Laterality: N/A;  . Microlaryngoscopy w/vocal cord injection  08/01/2012    Procedure: MICROLARYNGOSCOPY WITH VOCAL CORD INJECTION;  Surgeon: [name], MD;  Location: [facility];  Service: ENT;  Laterality: Left;  MICRO DIRECT LARYNGOSCOPY  AND RADIESSE  INJECTION   . Direct laryngoscopy with radiaesse injection N/A 01/23/2013   Procedure: DIRECT LARYNGOSCOPY WITH RADIESSE VOCAL CORD AUGMENTATION LARYNGOPLASTY;  Surgeon: [name], MD;  Location: [facility];  Service: ENT;  Laterality: N/A;    FHx:  No family history on file. SocHx:  reports that he quit smoking about 4 years ago. He does not have any smokeless tobacco history on file. He reports that he does not drink alcohol or use illicit drugs.  ALLERGIES: No Known Allergies   (Not in a hospital admission)  No results found for this or any previous visit (from the past 48 hour(s)). No results found.   There were no vitals taken for this visit.  PHYSICAL EXAM:  BP:106/62,  HR: 97 b/min,  Weight: 168 lb.  He is thin and dysphonic.  Voice is high-pitched and weak.  He is breathing comfortably.  He is not coughing or clearing his throat unusually.  Neck with surgical changes but no masses.   Lungs: Clear to auscultation Heart: Regular rate and rhythm without murmurs Abdomen: Soft, active Extremities: Normal configuration Neurologic: Grossly intact and symmetric.   Assessment/Plan Vocal cord paralysis, unilateral complete (478.32) (J38.01).  We are planning the same treatment.  You can go home the same day.  I will see you back here 3-4 weeks after surgery.  Hydrocodone-Acetaminophen 7.5-325 MG/15ML Oral Solution;10-20 ml po q4-6h prn pain; Qty600; R0; Rx.  [name] 08/03/2013, 6:56 PM

## 2013-08-07 ENCOUNTER — Encounter (HOSPITAL_BASED_OUTPATIENT_CLINIC_OR_DEPARTMENT_OTHER): Payer: Self-pay | Admitting: *Deleted

## 2013-08-07 ENCOUNTER — Encounter (HOSPITAL_BASED_OUTPATIENT_CLINIC_OR_DEPARTMENT_OTHER): Payer: Medicare Other | Admitting: Anesthesiology

## 2013-08-07 ENCOUNTER — Ambulatory Visit (HOSPITAL_BASED_OUTPATIENT_CLINIC_OR_DEPARTMENT_OTHER)
Admission: RE | Admit: 2013-08-07 | Discharge: 2013-08-07 | Disposition: A | Discharge status: Home / Self Care | Payer: Medicare Other | Source: Ambulatory Visit | Attending: Otolaryngology | Admitting: Otolaryngology

## 2013-08-07 ENCOUNTER — Encounter (HOSPITAL_BASED_OUTPATIENT_CLINIC_OR_DEPARTMENT_OTHER)
Admission: RE | Disposition: A | Discharge status: Home / Self Care | Payer: Self-pay | Source: Ambulatory Visit | Attending: Otolaryngology

## 2013-08-07 ENCOUNTER — Ambulatory Visit (HOSPITAL_BASED_OUTPATIENT_CLINIC_OR_DEPARTMENT_OTHER): Payer: Medicare Other | Admitting: Anesthesiology

## 2013-08-07 DIAGNOSIS — Z8501 Personal history of malignant neoplasm of esophagus: Secondary | ICD-10-CM | POA: Insufficient documentation

## 2013-08-07 DIAGNOSIS — J3801 Paralysis of vocal cords and larynx, unilateral: Secondary | ICD-10-CM | POA: Insufficient documentation

## 2013-08-07 DIAGNOSIS — Z87891 Personal history of nicotine dependence: Secondary | ICD-10-CM | POA: Insufficient documentation

## 2013-08-07 DIAGNOSIS — Z9089 Acquired absence of other organs: Secondary | ICD-10-CM | POA: Insufficient documentation

## 2013-08-07 HISTORY — PX: MICROLARYNGOSCOPY W/VOCAL CORD INJECTION: SHX2665

## 2013-08-07 LAB — POCT HEMOGLOBIN-HEMACUE: Hemoglobin: 13.4 g/dL (ref 13.0–17.0)

## 2013-08-07 SURGERY — MICROLARYNGOSCOPY, WITH VOCAL CORD INJECTION
Anesthesia: General | Site: Mouth | Wound class: Clean Contaminated

## 2013-08-07 MED ORDER — HYDROMORPHONE HCL PF 1 MG/ML IJ SOLN: 0.2500 mg | INTRAMUSCULAR | Status: DC | PRN

## 2013-08-07 MED ORDER — COCAINE HCL 4 % EX SOLN
CUTANEOUS | Status: DC | PRN
  Administered 2013-08-07: 4 mL via TOPICAL

## 2013-08-07 MED ORDER — MIDAZOLAM HCL 2 MG/2ML IJ SOLN
INTRAMUSCULAR | Status: AC
  Filled 2013-08-07: qty 2

## 2013-08-07 MED ORDER — EPHEDRINE SULFATE 50 MG/ML IJ SOLN
INTRAMUSCULAR | Status: DC | PRN
  Administered 2013-08-07: 10 mg via INTRAVENOUS

## 2013-08-07 MED ORDER — FENTANYL CITRATE 0.05 MG/ML IJ SOLN
INTRAMUSCULAR | Status: DC | PRN
  Administered 2013-08-07: 100 ug via INTRAVENOUS

## 2013-08-07 MED ORDER — PROPOFOL 10 MG/ML IV BOLUS
INTRAVENOUS | Status: DC | PRN
  Administered 2013-08-07: 10 mg via INTRAVENOUS

## 2013-08-07 MED ORDER — LACTATED RINGERS IV SOLN
INTRAVENOUS | Status: DC | PRN
  Administered 2013-08-07 (×2): via INTRAVENOUS

## 2013-08-07 MED ORDER — SUCCINYLCHOLINE CHLORIDE 20 MG/ML IJ SOLN
INTRAMUSCULAR | Status: AC
  Filled 2013-08-07: qty 1

## 2013-08-07 MED ORDER — SUCCINYLCHOLINE CHLORIDE 20 MG/ML IJ SOLN
INTRAMUSCULAR | Status: DC | PRN
  Administered 2013-08-07: 100 mg via INTRAVENOUS

## 2013-08-07 MED ORDER — COCAINE HCL 4 % EX SOLN
CUTANEOUS | Status: AC
  Filled 2013-08-07: qty 4

## 2013-08-07 MED ORDER — ONDANSETRON HCL 4 MG/2ML IJ SOLN: 4.0000 mg | Freq: Once | INTRAMUSCULAR | Status: DC | PRN

## 2013-08-07 MED ORDER — MEPERIDINE HCL 25 MG/ML IJ SOLN: 6.2500 mg | INTRAMUSCULAR | Status: DC | PRN

## 2013-08-07 MED ORDER — OXYCODONE HCL 5 MG PO TABS: 5.0000 mg | ORAL_TABLET | Freq: Once | ORAL | Status: DC | PRN

## 2013-08-07 MED ORDER — ONDANSETRON HCL 4 MG/2ML IJ SOLN
INTRAMUSCULAR | Status: DC | PRN
  Administered 2013-08-07: 4 mg via INTRAVENOUS

## 2013-08-07 MED ORDER — LIDOCAINE HCL (CARDIAC) 20 MG/ML IV SOLN
INTRAVENOUS | Status: DC | PRN
  Administered 2013-08-07: 75 mg via INTRAVENOUS

## 2013-08-07 MED ORDER — OXYCODONE HCL 5 MG/5ML PO SOLN: 5.0000 mg | Freq: Once | ORAL | Status: DC | PRN

## 2013-08-07 MED ORDER — MIDAZOLAM HCL 5 MG/5ML IJ SOLN
INTRAMUSCULAR | Status: DC | PRN
  Administered 2013-08-07: 2 mg via INTRAVENOUS

## 2013-08-07 MED ORDER — DEXAMETHASONE SODIUM PHOSPHATE 4 MG/ML IJ SOLN
INTRAMUSCULAR | Status: DC | PRN
  Administered 2013-08-07: 10 mg via INTRAVENOUS

## 2013-08-07 MED ORDER — FENTANYL CITRATE 0.05 MG/ML IJ SOLN
INTRAMUSCULAR | Status: AC
  Filled 2013-08-07: qty 4

## 2013-08-07 MED ORDER — LACTATED RINGERS IV SOLN
INTRAVENOUS | Status: DC
  Administered 2013-08-07: 10:00:00 via INTRAVENOUS

## 2013-08-07 SURGICAL SUPPLY — 21 items
CANISTER SUCT 1200ML W/VALVE (MISCELLANEOUS) ×2 IMPLANT
GAUZE SPONGE 4X4 12PLY STRL LF (GAUZE/BANDAGES/DRESSINGS) IMPLANT
GLOVE ECLIPSE 6.5 STRL STRAW (GLOVE) ×1 IMPLANT
GLOVE ECLIPSE 8.0 STRL XLNG CF (GLOVE) ×2 IMPLANT
GOWN PREVENTION PLUS XLARGE (GOWN DISPOSABLE) ×2 IMPLANT
GOWN PREVENTION PLUS XXLARGE (GOWN DISPOSABLE) ×2 IMPLANT
GUARD TEETH (MISCELLANEOUS) IMPLANT
KIT PROLARN PLUS GEL W/NDL (Prosthesis and Implant ENT) ×1 IMPLANT
MARKER SKIN DUAL TIP RULER LAB (MISCELLANEOUS) IMPLANT
NDL SPNL 22GX7 QUINCKE BK (NEEDLE) IMPLANT
NDL TRANS ORAL INJECTION (NEEDLE) IMPLANT
NEEDLE SPNL 22GX7 QUINCKE BK (NEEDLE) IMPLANT
NEEDLE TRANS ORAL INJECTION (NEEDLE) IMPLANT
NS IRRIG 1000ML POUR BTL (IV SOLUTION) IMPLANT
PATTIES SURGICAL .5 X3 (DISPOSABLE) IMPLANT
SHEET MEDIUM DRAPE 40X70 STRL (DRAPES) ×2 IMPLANT
SLEEVE SCD COMPRESS KNEE MED (MISCELLANEOUS) ×2 IMPLANT
SOLUTION BUTLER CLEAR DIP (MISCELLANEOUS) ×2 IMPLANT
TOWEL OR 17X24 6PK STRL BLUE (TOWEL DISPOSABLE) ×2 IMPLANT
TOWEL OR NON WOVEN STRL DISP B (DISPOSABLE) ×2 IMPLANT
TUBE CONNECTING 20X1/4 (TUBING) ×2 IMPLANT

## 2013-08-07 NOTE — Interval H&P Note (Signed)
History and Physical Interval Note:  08/07/2013 9:43 AM  [name]  has presented today for surgery, with the diagnosis of LEFT VOCAL CORD PARALYSIS   The various methods of treatment have been discussed with the patient and family. After consideration of risks, benefits and other options for treatment, the patient has consented to  Procedure(s): MICROLARYNGOSCOPY WITH RADIESSE VOCAL CORD AUGMENTATION  (N/A) as a surgical intervention .  The patient's history has been re-reviewed, patient re-examined, no change in status, stable for surgery.  I have re-reviewed the patient's chart and labs.  Questions were answered to the patient's satisfaction.     [name]

## 2013-08-07 NOTE — Op Note (Signed)
08/07/2013  11:14 AM    Gwinda Maine  [id number]   Pre-Op Dx:  Left vocal cord paralysis. Presbylarynx  Post-op Dx: Same  Proc: Microlaryngoscopy with bilateral vocal cord Radiesse augmentation   Surg:  [name] T MD  Anes:  GOT  EBL:  None  Comp:  None  Findings:  Lateralized and atrophic left true vocal cord.  Slight bowing of the right true vocal cord.  Procedure: With the patient in a comfortable supine position, general orotracheal anesthesia was induced without difficulty. At an appropriate level, the table was turned 90 the patient placed in a slight reverse Trendelenburg. A clean preparation and draping was accomplished.  Taking care to protect lips, teeth, and endotracheal tube, the anterior commissure laryngoscope was introduced passed into the glottis and suspended in the standard fashion. The findings were as described above. 4% cocaine solution on a cotton pledget was applied against the vocal cords for intraoperative hemostasis. Several minutes were allowed for this to take effect.  The pledgets were removed. Radiesse filler on a long needle was brought into the field and deep in the vocalis muscle on each side, augmentation was performed. 1.0 mL was used. A symmetric result was obtained. Small residual extrusion was suctioned clear. Additional 4% cocaine solution was applied against the raw surfaces for several minutes and then removed.  At this point the procedure was completed. The laryngoscope was unsuspended and removed. The tooth guard was removed. Dental status was intact. Patient was returned to anesthesia, awakened, extubated, and transferred to recovery in stable condition.  Dispo:   PACU to home  Plan:  Analgesia, cough suppression, antireflux measures. Recheck my office 3 weeks.  Cephus Richer MD

## 2013-08-07 NOTE — Transfer of Care (Signed)
Immediate Anesthesia Transfer of Care Note  Patient: [name]  Procedure(s) Performed: Procedure(s): MICROLARYNGOSCOPY WITH RADIESSE VOCAL CORD AUGMENTATION  (N/A)  Patient Location: PACU  Anesthesia Type:General  Level of Consciousness: awake, alert  and oriented  Airway & Oxygen Therapy: Patient Spontanous Breathing and Patient connected to face mask oxygen  Post-op Assessment: Report given to PACU RN and Post -op Vital signs reviewed and stable  Post vital signs: Reviewed and stable  Complications: No apparent anesthesia complications

## 2013-08-07 NOTE — Anesthesia Preprocedure Evaluation (Signed)
Anesthesia Evaluation  Patient identified by MRN, date of birth, ID band Patient awake    Reviewed: Allergy & Precautions, H&P , NPO status , Patient's Chart, lab work & pertinent test results  Airway Mallampati: I TM Distance: >3 FB Neck ROM: Full    Dental   Pulmonary former smoker,          Cardiovascular     Neuro/Psych    GI/Hepatic GERD-  Medicated and Controlled,  Endo/Other    Renal/GU      Musculoskeletal   Abdominal   Peds  Hematology   Anesthesia Other Findings   Reproductive/Obstetrics                           Anesthesia Physical Anesthesia Plan  ASA: II  Anesthesia Plan: General   Post-op Pain Management:    Induction: Intravenous  Airway Management Planned: Oral ETT  Additional Equipment:   Intra-op Plan:   Post-operative Plan: Extubation in OR  Informed Consent: I have reviewed the patients History and Physical, chart, labs and discussed the procedure including the risks, benefits and alternatives for the proposed anesthesia with the patient or authorized representative who has indicated his/her understanding and acceptance.     Plan Discussed with: CRNA and Surgeon  Anesthesia Plan Comments:         Anesthesia Quick Evaluation  

## 2013-08-07 NOTE — Anesthesia Postprocedure Evaluation (Signed)
Anesthesia Post Note  Patient: [name]  Procedure(s) Performed: Procedure(s) (LRB): MICROLARYNGOSCOPY WITH RADIESSE VOCAL CORD AUGMENTATION  (N/A)  Anesthesia type: general  Patient location: PACU  Post pain: Pain level controlled  Post assessment: Patient's Cardiovascular Status Stable  Last Vitals:  Filed Vitals:   08/07/13 1210  BP: 124/67  Pulse: 72  Temp: 36.4 C  Resp: 16    Post vital signs: Reviewed and stable  Level of consciousness: sedated  Complications: No apparent anesthesia complications

## 2013-08-07 NOTE — Anesthesia Procedure Notes (Signed)
Procedure Name: Intubation Date/Time: 08/07/2013 10:31 AM Performed by: Zenia Resides D Pre-anesthesia Checklist: Patient identified, Emergency Drugs available, Suction available and Patient being monitored Patient Re-evaluated:Patient Re-evaluated prior to inductionOxygen Delivery Method: Circle System Utilized Preoxygenation: Pre-oxygenation with 100% oxygen Intubation Type: IV induction Ventilation: Mask ventilation without difficulty Tube type: Oral Number of attempts: 1 Airway Equipment and Method: stylet and oral airway Placement Confirmation: ETT inserted through vocal cords under direct vision,  positive ETCO2 and breath sounds checked- equal and bilateral Secured at: 22 cm Tube secured with: Tape Dental Injury: Teeth and Oropharynx as per pre-operative assessment

## 2013-08-07 NOTE — H&P (View-Only) (Signed)
Marinez,  Jiovany 67 y.o., male [id number]     Chief Complaint: Hoarseness  HPI: 5 months recheck.  Known LEFT vocal cord paralysis s/p esophagectomy.  No cancer recurrence now 2 + yrs.  Prior vocal cord augmentation procedures done successfully.   Preoperative visit.  His voice is starting to fade again.  He is breathing comfortably.  No swallowing issues including aspiration.  No pain.  No neck masses.  To his knowledge, the esophageal cancer is gone.  PMH: Past Medical History  Diagnosis Date  . GERD (gastroesophageal reflux disease)   . Esophageal cancer     and prostate  . Hoarseness   . Abdominal discomfort   . Dysrhythmia     occ pac    Surg Hx: Past Surgical History  Procedure Laterality Date  . Esophagectomy  10/02/2010    Dr.Burney  . Pyloroplasty  09/29/2010    Dr.Burney  . Jejunostomy  09/29/2010    Dr.Burney  . Direct laryngoscopy with radiaesse injection  02/08/2012    Procedure: DIRECT LARYNGOSCOPY WITH RADIAESSE INJECTION;  Surgeon: Reginold Beale, MD;  Location: [facility];  Service: ENT;  Laterality: Bilateral;  microdirect-laryngosocopy  with bilateral vocal cord radiesse injection  . Direct laryngoscopy with radiaesse injection  03/21/2012    Procedure: DIRECT LARYNGOSCOPY WITH RADIAESSE INJECTION;  Surgeon: Roper Tolson, MD;  Location: [facility];  Service: ENT;  Laterality: N/A;  microdirect laryngoscopy with radiaesse injection left vocal cord  . Esophagoscopy  03/21/2012    Procedure: ESOPHAGOSCOPY;  Surgeon: Tadarius Maland, MD;  Location: [facility];  Service: ENT;  Laterality: N/A;  . Microlaryngoscopy w/vocal cord injection  08/01/2012    Procedure: MICROLARYNGOSCOPY WITH VOCAL CORD INJECTION;  Surgeon: Austan Nicholl, MD;  Location: [facility];  Service: ENT;  Laterality: Left;  MICRO DIRECT LARYNGOSCOPY  AND RADIESSE  INJECTION   . Direct laryngoscopy with radiaesse injection N/A 01/23/2013   Procedure: DIRECT LARYNGOSCOPY WITH RADIESSE VOCAL CORD AUGMENTATION LARYNGOPLASTY;  Surgeon: Teofil Maniaci, MD;  Location: [facility];  Service: ENT;  Laterality: N/A;    FHx:  No family history on file. SocHx:  reports that he quit smoking about 4 years ago. He does not have any smokeless tobacco history on file. He reports that he does not drink alcohol or use illicit drugs.  ALLERGIES: No Known Allergies   (Not in a hospital admission)  No results found for this or any previous visit (from the past 48 hour(s)). No results found.   There were no vitals taken for this visit.  PHYSICAL EXAM:  BP:106/62,  HR: 97 b/min,  Weight: 168 lb.  He is thin and dysphonic.  Voice is high-pitched and weak.  He is breathing comfortably.  He is not coughing or clearing his throat unusually.  Neck with surgical changes but no masses.   Lungs: Clear to auscultation Heart: Regular rate and rhythm without murmurs Abdomen: Soft, active Extremities: Normal configuration Neurologic: Grossly intact and symmetric.   Assessment/Plan Vocal cord paralysis, unilateral complete (478.32) (J38.01).  We are planning the same treatment.  You can go home the same day.  I will see you back here 3-4 weeks after surgery.  Hydrocodone-Acetaminophen 7.5-325 MG/15ML Oral Solution;10-20 ml po q4-6h prn pain; Qty600; R0; Rx.  Aragorn Recker 08/03/2013, 6:56 PM     

## 2013-08-08 ENCOUNTER — Encounter (HOSPITAL_BASED_OUTPATIENT_CLINIC_OR_DEPARTMENT_OTHER): Payer: Self-pay | Admitting: Otolaryngology

## 2013-08-22 ENCOUNTER — Telehealth: Payer: Self-pay | Admitting: *Deleted

## 2013-08-22 ENCOUNTER — Ambulatory Visit (HOSPITAL_BASED_OUTPATIENT_CLINIC_OR_DEPARTMENT_OTHER): Payer: Medicare Other | Admitting: Oncology

## 2013-08-22 VITALS — BP 102/62 | HR 92 | Temp 97.6°F | Resp 20 | Ht 73.0 in | Wt 167.8 lb

## 2013-08-22 DIAGNOSIS — C159 Malignant neoplasm of esophagus, unspecified: Secondary | ICD-10-CM

## 2013-08-22 DIAGNOSIS — Z23 Encounter for immunization: Secondary | ICD-10-CM

## 2013-08-22 DIAGNOSIS — Z8501 Personal history of malignant neoplasm of esophagus: Secondary | ICD-10-CM

## 2013-08-22 MED ORDER — INFLUENZA VAC SPLIT QUAD 0.5 ML IM SUSP
0.5000 mL | Freq: Once | INTRAMUSCULAR | Status: AC
  Administered 2013-08-22: 0.5 mL via INTRAMUSCULAR
  Filled 2013-08-22: qty 0.5

## 2013-08-22 NOTE — Progress Notes (Signed)
   [facility]    OFFICE PROGRESS NOTE   INTERVAL HISTORY:   He returns for scheduled followup. His cancer. He feels well. He exercises regularly. He has abdominal cramping after eating at times. He continues focal cord augmentation procedures with Dr. [name], last on 08/07/2013. The hoarseness has improved. No pain. He sees Dr. Isabel Caprice every 6 months for followup of prostate cancer. Objective:  Vital signs in last 24 hours:  Blood pressure 102/62, pulse 92, temperature 97.6 F (36.4 C), temperature source Oral, resp. rate 20, height 6\' 1"  (1.854 m), weight 167 lb 12.8 oz (76.114 kg).    HEENT: Neck without mass Lymphatics: No cervical, supraclavicular, left axillary, or inguinal nodes. Pea-sized mobile right axillary node. Resp: End inspiratory rales at the right base, no respiratory distress. Cardio: Regular rate and rhythm GI: No hepatosplenomegaly, nontender, no mass Vascular: No leg edema Breasts: Fullness of the right lateral breast tissue compared to the left side without a discrete mass.     Medications: I have reviewed the patient's current medications.  Assessment/Plan: 1. 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  a. Staging CT/PET scan evaluation showed evidence of local metastatic disease involving lymph nodes but no distant metastases. b. Status post weekly Taxol/carboplatin chemotherapy and concurrent radiation with the last week of chemotherapy given on 08/12/2010 and radiation completed 08/18/2010.  c. Restaging PET scan December 2011 revealed an interval decrease in the size of metabolic activity of the primary esophagus tumor. No evidence of hypermetabolic lymph node or distant metastases.  d. Esophagogastrectomy 09/29/2010 with no residual carcinoma identified. e. Restaging CT scans chest and abdomen 11/17/2011 negative for recurrent/metastatic  disease. f. Restaging CT scan 06/09/2012 negative for recurrent esophagus cancer 2. History of stage T1C prostate cancer. He continues an observation approach. He is followed by Dr. Isabel Caprice. 3. History of hypertension. 4. History of dysphagia and weight loss secondary to the obstructing esophagus mass. His weight is stable. He reports no dysphagia. 5. History of vesicular lesions at the right pubic area with the appearance of herpetic lesions. He was treated with Valtrex. 6. Postoperative anastomotic leak, resolved. 7. Hoarseness, followed by Dr. [name] with repeat focal cord injection therapy, last on 08/07/2013 8. Abdominal discomfort and diarrhea. Controlled with a combination of hyoscyamine, Levsin and Nexium. 9. Fullness at the right lateral breast-likely benign gynecomastia. He will contact us if this area changes.  Disposition:  He remains in clinical remission from esophagus cancer. He received an influenza vaccine today. Mr. Erber will return for an office visit in 6 months.   Thornton Papas, MD  08/22/2013  4:01 PM

## 2013-08-22 NOTE — Telephone Encounter (Signed)
appts made and printed...td 

## 2013-12-07 ENCOUNTER — Ambulatory Visit: Payer: Medicare Other | Admitting: Cardiothoracic Surgery

## 2013-12-11 ENCOUNTER — Ambulatory Visit (INDEPENDENT_AMBULATORY_CARE_PROVIDER_SITE_OTHER): Payer: Commercial Managed Care - HMO | Admitting: Cardiothoracic Surgery

## 2013-12-11 ENCOUNTER — Encounter: Payer: Self-pay | Admitting: Cardiothoracic Surgery

## 2013-12-11 VITALS — BP 120/68 | HR 77 | Resp 16 | Ht 73.0 in | Wt 175.0 lb

## 2013-12-11 DIAGNOSIS — C159 Malignant neoplasm of esophagus, unspecified: Secondary | ICD-10-CM

## 2013-12-11 DIAGNOSIS — Z09 Encounter for follow-up examination after completed treatment for conditions other than malignant neoplasm: Secondary | ICD-10-CM

## 2013-12-11 NOTE — Progress Notes (Signed)
[address]       [place] [address]             [phone]                             [name] [place] Medical Record #[id number] Date of Birth: 1946-06-15  [name] Pier, MD  [name] Crutch, MD  Chief Complaint:   PostOp Follow Up Visit Transhiatal total esophagectomy with cervical esophagogastrostomy complicated by anastomotic leak and left recurrent nerve palsy by Dr. [name] January 2012 ULCERATED AND REACTIVE GASTRIC OXYNTIC MUCOSA; YPTX, PN0, PMX  History of Present Illness:     Patient returns today. He still has hoarseness and probably has a recurrent  laryngeal nerve palsy that is permanent. He is being followed by ENT for this with injection about every 6 months .  The patient does note especially after early morning meal having some increased heart rate and feeling lightheaded that passes quickly. He notes that he has been taking oxycodone to improve his diet.   History  Smoking status  . Former Smoker  . Quit date: 06/13/2009  Smokeless tobacco  . Not on file       No Known Allergies  Current Outpatient Prescriptions  Medication Sig Dispense Refill  . aspirin 81 MG tablet Take 81 mg by mouth every other day.       . esomeprazole (NEXIUM) 40 MG capsule Take 40 mg by mouth 2 (two) times daily.       [name] HYDROcodone-acetaminophen (NORCO/VICODIN) 5-325 MG per tablet Take 1 tablet by mouth every 6 (six) hours as needed for moderate pain.      . hyoscyamine (CYSTOSPAZ) 0.15 MG tablet Take 0.15 mg by mouth 1 day or 1 dose.      . Multiple Vitamins-Minerals (MULTIVITAMIN WITH MINERALS) tablet Take 1 tablet by mouth daily.      [name] zolpidem (AMBIEN) 10 MG tablet Take 10 mg by mouth at bedtime as needed.        No current facility-administered medications for this visit.   Wt Readings from Last 3 Encounters:  12/11/13 175 lb (79.379 kg)  08/22/13 167 lb 12.8 oz (76.114 kg)  08/07/13 166 lb (75.297 kg)      Physical Exam: BP  120/68  Pulse 77  Resp 16  Ht 6\' 1"  (1.854 m)  Wt 175 lb (79.379 kg)  BMI 23.09 kg/m2  SpO2 98%  General appearance: alert, cooperative and Worse while speaking Neurologic: intact Heart: regular rate and rhythm, S1, S2 normal, no murmur, click, rub or gallop and normal apical impulse Lungs: clear to auscultation bilaterally and normal percussion bilaterally Abdomen: soft, non-tender; bowel sounds normal; no masses,  no organomegaly Extremities: extremities normal, atraumatic, no cyanosis or edema, Homans sign is negative, no sign of DVT and no edema, redness or tenderness in the calves or thighs Wound: Well-healed No cervical nodes or mass noted  Diagnostic Studies & Laboratory data:            Recent Labs: Lab Results  Component Value Date   WBC 5.8 10/19/2010   HGB 13.4 08/07/2013   HCT 43.0 08/01/2012   PLT 389 10/19/2010   GLUCOSE 95 08/01/2012   ALT 34 10/19/2010   AST 32 10/19/2010   NA 143 08/01/2012   K 4.5 08/01/2012   CL 105 08/01/2012   CREATININE 1.00 08/01/2012   BUN 16  08/01/2012   CO2 26 10/19/2010   INR 0.89 09/28/2010      Assessment / Plan:      Patient now 32 months months following transhiatal total esophagectomy for distal esophageal cancer. He was treated preoperatively with radiation and chemotherapy final path showed ulcerated gastric mucosa negative nodes ypTx,pN0,PMx. I have suggested to him that the sensation after eating even though he has no loose stools is a form of dumping. I suggested that he decrease his early morning carbohydrate load and try drinking after he eats. I discouraged the use of narcotics for diet stimulation. I plan to see him back in one year.    [name] B 12/11/2013 5:34 PM

## 2014-02-01 ENCOUNTER — Telehealth: Payer: Self-pay | Admitting: Oncology

## 2014-02-01 NOTE — Telephone Encounter (Signed)
Pt called an r/s appt to 6/8

## 2014-02-14 ENCOUNTER — Encounter (HOSPITAL_BASED_OUTPATIENT_CLINIC_OR_DEPARTMENT_OTHER): Payer: Self-pay | Admitting: *Deleted

## 2014-02-16 ENCOUNTER — Other Ambulatory Visit: Payer: Self-pay | Admitting: Otolaryngology

## 2014-02-16 NOTE — H&P (Signed)
[name], Memmott 68 y.o., male [id number]     Chief Complaint: hoarsness  HPI: 6-1/2 months return visit following Radiesse vocal cord augmentation.  He has done nicely until recently.  He still has occasional esophagospasm which he treats with hydrocodone.  He also remains on proton pump inhibitors.  No breathing or swallowing issues.   We discussed the surgery in detail including risks and complications.  Questions were answered and informed consent was obtained.  Advancement of diet and activity was discussed.  Liquid hydrocodone prescription written and given.  PMH: Past Medical History  Diagnosis Date  . GERD (gastroesophageal reflux disease)   . Esophageal cancer     and prostate  . Hoarseness   . Abdominal discomfort   . Dysrhythmia     occ pac  . Wears hearing aid     Surg Hx: Past Surgical History  Procedure Laterality Date  . Esophagectomy  10/02/2010    Dr.Burney  . Pyloroplasty  09/29/2010    Dr.Burney  . Jejunostomy  09/29/2010    Dr.Burney  . Direct laryngoscopy with radiaesse injection  02/08/2012    Procedure: DIRECT LARYNGOSCOPY WITH RADIAESSE INJECTION;  Surgeon: Jodi Marble, MD;  Location: [facility];  Service: ENT;  Laterality: Bilateral;  microdirect-laryngosocopy  with bilateral vocal cord radiesse injection  . Direct laryngoscopy with radiaesse injection  03/21/2012    Procedure: DIRECT LARYNGOSCOPY WITH RADIAESSE INJECTION;  Surgeon: Jodi Marble, MD;  Location: [facility];  Service: ENT;  Laterality: N/A;  microdirect laryngoscopy with radiaesse injection left vocal cord  . Esophagoscopy  03/21/2012    Procedure: ESOPHAGOSCOPY;  Surgeon: Jodi Marble, MD;  Location: [facility];  Service: ENT;  Laterality: N/A;  . Microlaryngoscopy w/vocal cord injection  08/01/2012    Procedure: MICROLARYNGOSCOPY WITH VOCAL CORD INJECTION;  Surgeon: Jodi Marble, MD;  Location: [facility];  Service: ENT;   Laterality: Left;  MICRO DIRECT LARYNGOSCOPY  AND RADIESSE  INJECTION   . Direct laryngoscopy with radiaesse injection N/A 01/23/2013    Procedure: DIRECT LARYNGOSCOPY WITH RADIESSE VOCAL CORD AUGMENTATION LARYNGOPLASTY;  Surgeon: Jodi Marble, MD;  Location: [facility];  Service: ENT;  Laterality: N/A;  . Microlaryngoscopy w/vocal cord injection N/A 08/07/2013    Procedure: MICROLARYNGOSCOPY WITH RADIESSE VOCAL CORD AUGMENTATION ;  Surgeon: Jodi Marble, MD;  Location: [facility];  Service: ENT;  Laterality: N/A;    FHx:  No family history on file. SocHx:  reports that he quit smoking about 4 years ago. He does not have any smokeless tobacco history on file. He reports that he does not drink alcohol or use illicit drugs.  ALLERGIES: No Known Allergies   (Not in a hospital admission)  No results found for this or any previous visit (from the past 48 hour(s)). No results found.    There were no vitals taken for this visit.  PHYSICAL EXAM: He is  energetic.  His voice is high-pitched and rather airy.       Lungs: Clear to auscultation Heart: Regular rate and rhythm without murmur Abdomen: Soft, active Extremities: Normal configuration Neurologic: Symmetric, grossly intact. Neck: no nodes.  Healed incisions  Assessment/Plan Acquired vocal cord palsy (478.30) (J38.00).  We will go ahead with the vocal cord injections again next week.  Liquid hydrocodone for pain relief as needed.  Return visit here one month postop please.  Hydrocodone-Acetaminophen 7.5-325 MG/15ML Oral Solution;10-20 ml po q4-6h prn pain; Qty300; R0; Rx.  [name]  [facility] 1/84/0375, 43:60 PM

## 2014-02-19 ENCOUNTER — Encounter (HOSPITAL_BASED_OUTPATIENT_CLINIC_OR_DEPARTMENT_OTHER): Payer: Medicare HMO | Admitting: Anesthesiology

## 2014-02-19 ENCOUNTER — Encounter (HOSPITAL_BASED_OUTPATIENT_CLINIC_OR_DEPARTMENT_OTHER): Payer: Self-pay | Admitting: *Deleted

## 2014-02-19 ENCOUNTER — Ambulatory Visit (HOSPITAL_BASED_OUTPATIENT_CLINIC_OR_DEPARTMENT_OTHER): Payer: Medicare HMO | Admitting: Anesthesiology

## 2014-02-19 ENCOUNTER — Ambulatory Visit (HOSPITAL_BASED_OUTPATIENT_CLINIC_OR_DEPARTMENT_OTHER)
Admission: RE | Admit: 2014-02-19 | Discharge: 2014-02-19 | Disposition: A | Discharge status: Home / Self Care | Payer: Medicare HMO | Source: Ambulatory Visit | Attending: Otolaryngology | Admitting: Otolaryngology

## 2014-02-19 ENCOUNTER — Encounter (HOSPITAL_BASED_OUTPATIENT_CLINIC_OR_DEPARTMENT_OTHER)
Admission: RE | Disposition: A | Discharge status: Home / Self Care | Payer: Self-pay | Source: Ambulatory Visit | Attending: Otolaryngology

## 2014-02-19 DIAGNOSIS — K219 Gastro-esophageal reflux disease without esophagitis: Secondary | ICD-10-CM | POA: Insufficient documentation

## 2014-02-19 DIAGNOSIS — Z8546 Personal history of malignant neoplasm of prostate: Secondary | ICD-10-CM | POA: Insufficient documentation

## 2014-02-19 DIAGNOSIS — Z87891 Personal history of nicotine dependence: Secondary | ICD-10-CM | POA: Insufficient documentation

## 2014-02-19 DIAGNOSIS — J3802 Paralysis of vocal cords and larynx, bilateral: Secondary | ICD-10-CM | POA: Insufficient documentation

## 2014-02-19 DIAGNOSIS — Z8501 Personal history of malignant neoplasm of esophagus: Secondary | ICD-10-CM | POA: Insufficient documentation

## 2014-02-19 DIAGNOSIS — J3801 Paralysis of vocal cords and larynx, unilateral: Secondary | ICD-10-CM

## 2014-02-19 HISTORY — PX: MICROLARYNGOSCOPY W/VOCAL CORD INJECTION: SHX2665

## 2014-02-19 HISTORY — DX: Presence of external hearing-aid: Z97.4

## 2014-02-19 SURGERY — MICROLARYNGOSCOPY, WITH VOCAL CORD INJECTION
Anesthesia: General

## 2014-02-19 MED ORDER — MIDAZOLAM HCL 2 MG/2ML IJ SOLN
INTRAMUSCULAR | Status: AC
  Filled 2014-02-19: qty 2

## 2014-02-19 MED ORDER — LIDOCAINE HCL (CARDIAC) 20 MG/ML IV SOLN
INTRAVENOUS | Status: DC | PRN
  Administered 2014-02-19: 40 mg via INTRAVENOUS

## 2014-02-19 MED ORDER — COCAINE HCL 4 % EX SOLN
CUTANEOUS | Status: AC
  Filled 2014-02-19: qty 4

## 2014-02-19 MED ORDER — PROPOFOL 10 MG/ML IV BOLUS
INTRAVENOUS | Status: DC | PRN
  Administered 2014-02-19: 100 mg via INTRAVENOUS

## 2014-02-19 MED ORDER — SUCCINYLCHOLINE CHLORIDE 20 MG/ML IJ SOLN
INTRAMUSCULAR | Status: DC | PRN
  Administered 2014-02-19: 40 mg via INTRAVENOUS

## 2014-02-19 MED ORDER — HYDROMORPHONE HCL PF 1 MG/ML IJ SOLN: 0.2500 mg | INTRAMUSCULAR | Status: DC | PRN

## 2014-02-19 MED ORDER — EPINEPHRINE HCL 1 MG/ML IJ SOLN
INTRAMUSCULAR | Status: AC
  Filled 2014-02-19: qty 1

## 2014-02-19 MED ORDER — LACTATED RINGERS IV SOLN
INTRAVENOUS | Status: DC
  Administered 2014-02-19: 07:00:00 via INTRAVENOUS

## 2014-02-19 MED ORDER — MIDAZOLAM HCL 2 MG/2ML IJ SOLN: 1.0000 mg | INTRAMUSCULAR | Status: DC | PRN

## 2014-02-19 MED ORDER — COCAINE HCL 4 % EX SOLN
CUTANEOUS | Status: DC | PRN
Start: 2014-02-19 — End: 2014-02-19
  Administered 2014-02-19: 4 mL

## 2014-02-19 MED ORDER — ONDANSETRON HCL 4 MG/2ML IJ SOLN
INTRAMUSCULAR | Status: DC | PRN
  Administered 2014-02-19: 4 mg via INTRAVENOUS

## 2014-02-19 MED ORDER — FENTANYL CITRATE 0.05 MG/ML IJ SOLN
INTRAMUSCULAR | Status: DC | PRN
  Administered 2014-02-19: 50 ug via INTRAVENOUS

## 2014-02-19 MED ORDER — DEXAMETHASONE SODIUM PHOSPHATE 4 MG/ML IJ SOLN
INTRAMUSCULAR | Status: DC | PRN
  Administered 2014-02-19: 10 mg via INTRAVENOUS

## 2014-02-19 MED ORDER — FENTANYL CITRATE 0.05 MG/ML IJ SOLN: 50.0000 ug | INTRAMUSCULAR | Status: DC | PRN

## 2014-02-19 MED ORDER — FENTANYL CITRATE 0.05 MG/ML IJ SOLN
INTRAMUSCULAR | Status: AC
  Filled 2014-02-19: qty 4

## 2014-02-19 MED ORDER — MIDAZOLAM HCL 5 MG/5ML IJ SOLN
INTRAMUSCULAR | Status: DC | PRN
  Administered 2014-02-19: 2 mg via INTRAVENOUS

## 2014-02-19 SURGICAL SUPPLY — 25 items
CANISTER SUCT 1200ML W/VALVE (MISCELLANEOUS) ×2 IMPLANT
GLOVE BIO SURGEON STRL SZ 6.5 (GLOVE) ×1 IMPLANT
GLOVE BIOGEL PI IND STRL 7.0 (GLOVE) IMPLANT
GLOVE BIOGEL PI INDICATOR 7.0 (GLOVE) ×2
GLOVE ECLIPSE 8.0 STRL XLNG CF (GLOVE) ×2 IMPLANT
GOWN STRL REUS W/ TWL LRG LVL3 (GOWN DISPOSABLE) ×1 IMPLANT
GOWN STRL REUS W/ TWL XL LVL3 (GOWN DISPOSABLE) ×1 IMPLANT
GOWN STRL REUS W/TWL LRG LVL3 (GOWN DISPOSABLE) ×2
GOWN STRL REUS W/TWL XL LVL3 (GOWN DISPOSABLE) ×2
GUARD TEETH (MISCELLANEOUS) ×2 IMPLANT
KIT PROLARN PLUS GEL W/NDL (Prosthesis and Implant ENT) ×1 IMPLANT
MARKER SKIN DUAL TIP RULER LAB (MISCELLANEOUS) IMPLANT
NDL SPNL 22GX7 QUINCKE BK (NEEDLE) IMPLANT
NDL TRANS ORAL INJECTION (NEEDLE) IMPLANT
NEEDLE SPNL 22GX7 QUINCKE BK (NEEDLE) IMPLANT
NEEDLE TRANS ORAL INJECTION (NEEDLE) IMPLANT
NS IRRIG 1000ML POUR BTL (IV SOLUTION) IMPLANT
PATTIES SURGICAL .5 X3 (DISPOSABLE) IMPLANT
SHEET MEDIUM DRAPE 40X70 STRL (DRAPES) ×2 IMPLANT
SLEEVE SCD COMPRESS KNEE MED (MISCELLANEOUS) ×2 IMPLANT
SOLUTION BUTLER CLEAR DIP (MISCELLANEOUS) IMPLANT
SPONGE GAUZE 4X4 12PLY STER LF (GAUZE/BANDAGES/DRESSINGS) ×2 IMPLANT
TOWEL OR 17X24 6PK STRL BLUE (TOWEL DISPOSABLE) ×2 IMPLANT
TOWEL OR NON WOVEN STRL DISP B (DISPOSABLE) ×2 IMPLANT
TUBE CONNECTING 20X1/4 (TUBING) ×2 IMPLANT

## 2014-02-19 NOTE — Anesthesia Postprocedure Evaluation (Signed)
  Anesthesia Post-op Note  Patient: [name]  Procedure(s) Performed: Procedure(s): MICROLARYNGOSCOPY WITH BILATERAL RADIESSE  VOCAL CORD AUGMENTATION (N/A)  Patient Location: PACU  Anesthesia Type:General  Level of Consciousness: awake and alert   Airway and Oxygen Therapy: Patient Spontanous Breathing  Post-op Pain: none  Post-op Assessment: Post-op Vital signs reviewed, Patient's Cardiovascular Status Stable and Respiratory Function Stable  Post-op Vital Signs: Reviewed  Filed Vitals:   02/19/14 0900  BP: 117/63  Pulse: 65  Temp:   Resp: 12    Complications: No apparent anesthesia complications

## 2014-02-19 NOTE — Discharge Instructions (Signed)
Advance diet as comfortable Ice collar as desired Hydrocodone for pain relief or cough suppression as needed You are OK to use your voice as much as desired Recheck my office 3 weeks, [phone] for an appointment Call for excessive pain, any breathing difficulty   Post Anesthesia Home Care Instructions  Activity: Get plenty of rest for the remainder of the day. A responsible adult should stay with you for 24 hours following the procedure.  For the next 24 hours, DO NOT: -Drive a car -Paediatric nurse -Drink alcoholic beverages -Take any medication unless instructed by your physician -Make any legal decisions or sign important papers.  Meals: Start with liquid foods such as gelatin or soup. Progress to regular foods as tolerated. Avoid greasy, spicy, heavy foods. If nausea and/or vomiting occur, drink only clear liquids until the nausea and/or vomiting subsides. Call your physician if vomiting continues.  Special Instructions/Symptoms: Your throat may feel dry or sore from the anesthesia or the breathing tube placed in your throat during surgery. If this causes discomfort, gargle with warm salt water. The discomfort should disappear within 24 hours.

## 2014-02-19 NOTE — H&P (View-Only) (Signed)
[name], [name] 68 y.o., male [id number]     Chief Complaint: hoarsness  HPI: 6-1/2 months return visit following Radiesse vocal cord augmentation.  He has done nicely until recently.  He still has occasional esophagospasm which he treats with hydrocodone.  He also remains on proton pump inhibitors.  No breathing or swallowing issues.   We discussed the surgery in detail including risks and complications.  Questions were answered and informed consent was obtained.  Advancement of diet and activity was discussed.  Liquid hydrocodone prescription written and given.  PMH: Past Medical History  Diagnosis Date  . GERD (gastroesophageal reflux disease)   . Esophageal cancer     and prostate  . Hoarseness   . Abdominal discomfort   . Dysrhythmia     occ pac  . Wears hearing aid     Surg Hx: Past Surgical History  Procedure Laterality Date  . Esophagectomy  10/02/2010    Dr.Burney  . Pyloroplasty  09/29/2010    Dr.Burney  . Jejunostomy  09/29/2010    Dr.Burney  . Direct laryngoscopy with radiaesse injection  02/08/2012    Procedure: DIRECT LARYNGOSCOPY WITH RADIAESSE INJECTION;  Surgeon: Jodi Marble, MD;  Location: [place];  Service: ENT;  Laterality: Bilateral;  microdirect-laryngosocopy  with bilateral vocal cord radiesse injection  . Direct laryngoscopy with radiaesse injection  03/21/2012    Procedure: DIRECT LARYNGOSCOPY WITH RADIAESSE INJECTION;  Surgeon: Jodi Marble, MD;  Location: [place];  Service: ENT;  Laterality: N/A;  microdirect laryngoscopy with radiaesse injection left vocal cord  . Esophagoscopy  03/21/2012    Procedure: ESOPHAGOSCOPY;  Surgeon: Jodi Marble, MD;  Location: [place];  Service: ENT;  Laterality: N/A;  . Microlaryngoscopy w/vocal cord injection  08/01/2012    Procedure: MICROLARYNGOSCOPY WITH VOCAL CORD INJECTION;  Surgeon: Jodi Marble, MD;  Location: [place];  Service: ENT;   Laterality: Left;  MICRO DIRECT LARYNGOSCOPY  AND RADIESSE  INJECTION   . Direct laryngoscopy with radiaesse injection N/A 01/23/2013    Procedure: DIRECT LARYNGOSCOPY WITH RADIESSE VOCAL CORD AUGMENTATION LARYNGOPLASTY;  Surgeon: Jodi Marble, MD;  Location: [place];  Service: ENT;  Laterality: N/A;  . Microlaryngoscopy w/vocal cord injection N/A 08/07/2013    Procedure: MICROLARYNGOSCOPY WITH RADIESSE VOCAL CORD AUGMENTATION ;  Surgeon: Jodi Marble, MD;  Location: [place];  Service: ENT;  Laterality: N/A;    FHx:  No family history on file. SocHx:  reports that he quit smoking about 4 years ago. He does not have any smokeless tobacco history on file. He reports that he does not drink alcohol or use illicit drugs.  ALLERGIES: No Known Allergies   (Not in a hospital admission)  No results found for this or any previous visit (from the past 48 hour(s)). No results found.    There were no vitals taken for this visit.  PHYSICAL EXAM: He is  energetic.  His voice is high-pitched and rather airy.       Lungs: Clear to auscultation Heart: Regular rate and rhythm without murmur Abdomen: Soft, active Extremities: Normal configuration Neurologic: Symmetric, grossly intact. Neck: no nodes.  Healed incisions  Assessment/Plan Acquired vocal cord palsy (478.30) (J38.00).  We will go ahead with the vocal cord injections again next week.  Liquid hydrocodone for pain relief as needed.  Return visit here one month postop please.  Hydrocodone-Acetaminophen 7.5-325 MG/15ML Oral Solution;10-20 ml po q4-6h prn pain; Qty300; R0; Rx.  [name]  [facility] 1/84/0375, 43:60 PM

## 2014-02-19 NOTE — Anesthesia Preprocedure Evaluation (Signed)
Anesthesia Evaluation  Patient identified by MRN, date of birth, ID band Patient awake    Reviewed: Allergy & Precautions, H&P , NPO status , Patient's Chart, lab work & pertinent test results  Airway Mallampati: III TM Distance: >3 FB Neck ROM: Full  Mouth opening: Limited Mouth Opening  Dental no notable dental hx. (+) Teeth Intact, Dental Advisory Given   Pulmonary neg pulmonary ROS, former smoker,  breath sounds clear to auscultation  Pulmonary exam normal       Cardiovascular + dysrhythmias Rhythm:Regular Rate:Normal     Neuro/Psych negative neurological ROS  negative psych ROS   GI/Hepatic Neg liver ROS, GERD-  Medicated and Controlled,  Endo/Other  negative endocrine ROS  Renal/GU negative Renal ROS  negative genitourinary   Musculoskeletal   Abdominal   Peds  Hematology negative hematology ROS (+)   Anesthesia Other Findings   Reproductive/Obstetrics negative OB ROS                           Anesthesia Physical Anesthesia Plan  ASA: II  Anesthesia Plan: General   Post-op Pain Management:    Induction: Intravenous  Airway Management Planned: Oral ETT  Additional Equipment:   Intra-op Plan:   Post-operative Plan: Extubation in OR  Informed Consent: I have reviewed the patients History and Physical, chart, labs and discussed the procedure including the risks, benefits and alternatives for the proposed anesthesia with the patient or authorized representative who has indicated his/her understanding and acceptance.   Dental advisory given  Plan Discussed with: CRNA  Anesthesia Plan Comments:         Anesthesia Quick Evaluation

## 2014-02-19 NOTE — Interval H&P Note (Signed)
History and Physical Interval Note:  02/19/2014 7:37 AM  [name]  has presented today for surgery, with the diagnosis of LEFT PARALYZED VOCAL CORD  The various methods of treatment have been discussed with the patient and family. After consideration of risks, benefits and other options for treatment, the patient has consented to  Procedure(s): MICROLARYNGOSCOPY WITH BILATERAL RADIESSE  VOCAL CORD AUGMENTATION (N/A) as a surgical intervention .  The patient's history has been re-reviewed, patient re-examined, no change in status, stable for surgery.  I have re-reviewed the patient's chart and labs.  Questions were answered to the patient's satisfaction.     [name] [facility]

## 2014-02-19 NOTE — Transfer of Care (Signed)
Immediate Anesthesia Transfer of Care Note  Patient: Mahkai Fangman  Procedure(s) Performed: Procedure(s): MICROLARYNGOSCOPY WITH BILATERAL RADIESSE  VOCAL CORD AUGMENTATION (N/A)  Patient Location: PACU  Anesthesia Type:General  Level of Consciousness: sedated  Airway & Oxygen Therapy: Patient Spontanous Breathing and Patient connected to face mask oxygen  Post-op Assessment: Report given to PACU RN and Post -op Vital signs reviewed and stable  Post vital signs: Reviewed and stable  Complications: No apparent anesthesia complications

## 2014-02-19 NOTE — Anesthesia Procedure Notes (Signed)
Procedure Name: Intubation Date/Time: 02/19/2014 7:48 AM Performed by: Marrianne Mood Pre-anesthesia Checklist: Patient identified, Emergency Drugs available, Suction available, Patient being monitored and Timeout performed Patient Re-evaluated:Patient Re-evaluated prior to inductionOxygen Delivery Method: Circle System Utilized Preoxygenation: Pre-oxygenation with 100% oxygen Intubation Type: IV induction Ventilation: Mask ventilation without difficulty Laryngoscope Size: Miller and 3 Tube type: Oral Tube size: 6.0 mm Number of attempts: 1 Airway Equipment and Method: stylet and oral airway Placement Confirmation: ETT inserted through vocal cords under direct vision,  positive ETCO2 and breath sounds checked- equal and bilateral Tube secured with: Tape Dental Injury: Teeth and Oropharynx as per pre-operative assessment

## 2014-02-19 NOTE — Op Note (Signed)
02/19/2014  8:19 AM    [name]  [id number]   Pre-Op Dx:  Left vocal cord paralysis  Post-op Dx: Same  Proc: Microlaryngoscopy with vocal cord augmentation , bilateral  Surg:  Jodi Marble T MD  Anes:  GOT  EBL:  None  Comp:  None  Findings:  Bowing of both vocal cords, left worse than right.  Procedure: With the patient in a comfortable supine position, general orotracheal anesthesia was induced without difficulty.  At an appropriate level, the table was turned 90. The patient was placed in reverse Trendelenburg. A clean preparation and draping was accomplished.  A rubber tooth guard was applied. Taking care to protect lips, teeth, and endotracheal tube, the anterior commissure laryngoscope was introduced, passed into the glottis, and suspended from the Mayo stand in the standard fashion. The findings were as described above.  4% cocaine solution was applied on the surface of the vocal cords using a cotton pledget. Several minutes were allowed for topical anesthesia and hemostasis to take effect.  Radiesse solution was applied into the vocalis muscle on each side using the laryngoscopy needle until a bulging contour of the cord was accomplished on each side. Hemostasis was spontaneous. Small amounts of residual material is from the puncture sites and was suctioned clear. 0.8 mL total was used, approximately 0.5 mL on the left and 0.3 ML on the right.    The mucosa was once again anesthetized using 4% cocaine solution. The pledget was removed. The procedure was completed. The laryngoscope was suspended and removed. The tooth guard was removed. The dental status was intact.  Patient was returned anesthesia, awakened, extubated, and transferred to recovery in stable condition.  Dispo:   OR to PACU to home  Plan:  Ice, elevation, analgesia for pain suppression and cough suppression. Recheck my office 3 weeks.  Tyson Alias MD

## 2014-02-20 ENCOUNTER — Ambulatory Visit: Payer: Medicare Other | Admitting: Nurse Practitioner

## 2014-02-21 ENCOUNTER — Encounter (HOSPITAL_BASED_OUTPATIENT_CLINIC_OR_DEPARTMENT_OTHER): Payer: Self-pay | Admitting: Otolaryngology

## 2014-02-26 ENCOUNTER — Ambulatory Visit (HOSPITAL_BASED_OUTPATIENT_CLINIC_OR_DEPARTMENT_OTHER): Payer: Commercial Managed Care - HMO | Admitting: Nurse Practitioner

## 2014-02-26 ENCOUNTER — Telehealth: Payer: Self-pay | Admitting: Oncology

## 2014-02-26 VITALS — BP 128/75 | HR 88 | Temp 97.3°F | Resp 19 | Ht 73.0 in | Wt 171.5 lb

## 2014-02-26 DIAGNOSIS — I10 Essential (primary) hypertension: Secondary | ICD-10-CM

## 2014-02-26 DIAGNOSIS — C779 Secondary and unspecified malignant neoplasm of lymph node, unspecified: Secondary | ICD-10-CM

## 2014-02-26 DIAGNOSIS — C159 Malignant neoplasm of esophagus, unspecified: Secondary | ICD-10-CM

## 2014-02-26 DIAGNOSIS — Z8546 Personal history of malignant neoplasm of prostate: Secondary | ICD-10-CM

## 2014-02-26 DIAGNOSIS — R109 Unspecified abdominal pain: Secondary | ICD-10-CM

## 2014-02-26 DIAGNOSIS — R197 Diarrhea, unspecified: Secondary | ICD-10-CM

## 2014-02-26 DIAGNOSIS — C155 Malignant neoplasm of lower third of esophagus: Secondary | ICD-10-CM

## 2014-02-26 NOTE — Telephone Encounter (Signed)
gv adn printed appt sched and avs for pt for March 2016 °

## 2014-02-26 NOTE — Progress Notes (Signed)
  [place] OFFICE PROGRESS NOTE   Diagnosis:  Esophagus cancer.  INTERVAL HISTORY:   [name] returns as scheduled. He overall feels well. He has a good appetite and good energy level. He reports he is maintaining his weight. Swallowing is "okay". He continues to have trouble with "dense food". He denies odynophagia. Bowels moving regularly. He reports having vocal cord injections about every 6 months for hoarseness.  He reports an intermittent rash in the pubic regions. The rash typically occurs during the summer months.  Objective:  Vital signs in last 24 hours:  Blood pressure 128/75, pulse 88, temperature 97.3 F (36.3 C), temperature source Oral, resp. rate 19, height 6\' 1"  (1.854 m), weight 171 lb 8 oz (77.792 kg).    HEENT: No thrush or ulcerations. Lymphatics: No palpable cervical, supraclavicular, axillary or inguinal lymph nodes. Resp: Lungs clear. Cardio: Regular cardiac rhythm. GI: Abdomen soft and nontender. No hepatomegaly. No mass. Vascular: No leg edema. Skin: 2 or 3 small mildly erythematous, flat skin lesions in the pubic region. The lesions do not appear vesicular.   Lab Results:  Lab Results  Component Value Date   WBC 5.8 10/19/2010   HGB 13.4 08/07/2013   HCT 43.0 08/01/2012   MCV 92.5 10/19/2010   PLT 389 10/19/2010   NEUTROABS 2.7 08/19/2010    Imaging:  No results found.  Medications: I have reviewed the patient's current medications.  Assessment/Plan: 1. 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  a. Staging CT/PET scan evaluation showed evidence of local metastatic disease involving lymph nodes but no distant metastases. b. Status post weekly Taxol/carboplatin chemotherapy and concurrent radiation with the last week of chemotherapy given on 08/12/2010 and radiation completed 08/18/2010.  c. Restaging PET scan December 2011 revealed an  interval decrease in the size of metabolic activity of the primary esophagus tumor. No evidence of hypermetabolic lymph node or distant metastases.  d. Esophagogastrectomy 09/29/2010 with no residual carcinoma identified. e. Restaging CT scans chest and abdomen 11/17/2011 negative for recurrent/metastatic disease. f. Restaging CT scan 06/09/2012 negative for recurrent esophagus cancer 2. History of stage T1C prostate cancer. He continues an observation approach. He is followed by Dr. Risa Grill. 3. History of hypertension. 4. History of dysphagia and weight loss secondary to the obstructing esophagus mass. His weight is stable. He reports no dysphagia. 5. History of vesicular lesions at the right pubic area with the appearance of herpetic lesions. He was treated with Valtrex. 6. Postoperative anastomotic leak, resolved. 7. Hoarseness, followed by Dr. Erik Obey with repeat focal cord injection therapy, last on 08/07/2013 8. Abdominal discomfort and diarrhea. Controlled with a combination of hyoscyamine, Levsin and Nexium. 9. Fullness at the right lateral breast-likely benign gynecomastia. He will contact us if this area changes.   Disposition: [name] remains in clinical remission from esophagus cancer. He will return for a followup visit in 9 months.  Plan reviewed with Dr. Benay Spice.    Owens Shark ANP/GNP-BC   02/26/2014  12:49 PM

## 2014-04-12 ENCOUNTER — Ambulatory Visit: Payer: Commercial Managed Care - HMO | Admitting: Cardiothoracic Surgery

## 2014-09-21 HISTORY — PX: LARYNGOPLASTY: SHX282

## 2014-09-24 DIAGNOSIS — R9431 Abnormal electrocardiogram [ECG] [EKG]: Secondary | ICD-10-CM | POA: Diagnosis not present

## 2014-09-24 DIAGNOSIS — R490 Dysphonia: Secondary | ICD-10-CM | POA: Diagnosis not present

## 2014-09-24 DIAGNOSIS — Z923 Personal history of irradiation: Secondary | ICD-10-CM | POA: Diagnosis not present

## 2014-09-24 DIAGNOSIS — Z8501 Personal history of malignant neoplasm of esophagus: Secondary | ICD-10-CM | POA: Diagnosis not present

## 2014-09-24 DIAGNOSIS — K219 Gastro-esophageal reflux disease without esophagitis: Secondary | ICD-10-CM | POA: Diagnosis not present

## 2014-09-24 DIAGNOSIS — J3801 Paralysis of vocal cords and larynx, unilateral: Secondary | ICD-10-CM | POA: Diagnosis not present

## 2014-09-24 DIAGNOSIS — Z87891 Personal history of nicotine dependence: Secondary | ICD-10-CM | POA: Diagnosis not present

## 2014-09-24 DIAGNOSIS — Z87442 Personal history of urinary calculi: Secondary | ICD-10-CM | POA: Diagnosis not present

## 2014-09-24 DIAGNOSIS — Z9889 Other specified postprocedural states: Secondary | ICD-10-CM | POA: Diagnosis not present

## 2014-09-24 DIAGNOSIS — Z01810 Encounter for preprocedural cardiovascular examination: Secondary | ICD-10-CM | POA: Diagnosis not present

## 2014-09-27 DIAGNOSIS — K219 Gastro-esophageal reflux disease without esophagitis: Secondary | ICD-10-CM | POA: Diagnosis not present

## 2014-09-27 DIAGNOSIS — Z9889 Other specified postprocedural states: Secondary | ICD-10-CM | POA: Diagnosis not present

## 2014-09-27 DIAGNOSIS — Z87891 Personal history of nicotine dependence: Secondary | ICD-10-CM | POA: Diagnosis not present

## 2014-09-27 DIAGNOSIS — Z8501 Personal history of malignant neoplasm of esophagus: Secondary | ICD-10-CM | POA: Diagnosis not present

## 2014-09-27 DIAGNOSIS — R490 Dysphonia: Secondary | ICD-10-CM | POA: Diagnosis not present

## 2014-09-27 DIAGNOSIS — J3801 Paralysis of vocal cords and larynx, unilateral: Secondary | ICD-10-CM | POA: Diagnosis not present

## 2014-09-27 DIAGNOSIS — Z923 Personal history of irradiation: Secondary | ICD-10-CM | POA: Diagnosis not present

## 2014-09-27 DIAGNOSIS — Z87442 Personal history of urinary calculi: Secondary | ICD-10-CM | POA: Diagnosis not present

## 2014-10-15 DIAGNOSIS — C61 Malignant neoplasm of prostate: Secondary | ICD-10-CM | POA: Diagnosis not present

## 2014-10-19 DIAGNOSIS — C61 Malignant neoplasm of prostate: Secondary | ICD-10-CM | POA: Diagnosis not present

## 2014-10-19 DIAGNOSIS — N200 Calculus of kidney: Secondary | ICD-10-CM | POA: Diagnosis not present

## 2014-10-22 DIAGNOSIS — J3801 Paralysis of vocal cords and larynx, unilateral: Secondary | ICD-10-CM | POA: Diagnosis not present

## 2014-10-26 DIAGNOSIS — G479 Sleep disorder, unspecified: Secondary | ICD-10-CM | POA: Diagnosis not present

## 2014-10-26 DIAGNOSIS — N5201 Erectile dysfunction due to arterial insufficiency: Secondary | ICD-10-CM | POA: Diagnosis not present

## 2014-10-26 DIAGNOSIS — C159 Malignant neoplasm of esophagus, unspecified: Secondary | ICD-10-CM | POA: Diagnosis not present

## 2014-10-26 DIAGNOSIS — J309 Allergic rhinitis, unspecified: Secondary | ICD-10-CM | POA: Diagnosis not present

## 2014-10-26 DIAGNOSIS — C61 Malignant neoplasm of prostate: Secondary | ICD-10-CM | POA: Diagnosis not present

## 2014-11-19 DIAGNOSIS — H5210 Myopia, unspecified eye: Secondary | ICD-10-CM | POA: Diagnosis not present

## 2014-11-19 DIAGNOSIS — H5213 Myopia, bilateral: Secondary | ICD-10-CM | POA: Diagnosis not present

## 2014-11-19 DIAGNOSIS — Z0100 Encounter for examination of eyes and vision without abnormal findings: Secondary | ICD-10-CM | POA: Diagnosis not present

## 2014-11-26 ENCOUNTER — Telehealth: Payer: Self-pay | Admitting: Oncology

## 2014-11-26 ENCOUNTER — Ambulatory Visit (HOSPITAL_BASED_OUTPATIENT_CLINIC_OR_DEPARTMENT_OTHER): Payer: Commercial Managed Care - HMO | Admitting: Oncology

## 2014-11-26 VITALS — BP 115/62 | HR 84 | Temp 98.0°F | Resp 19 | Ht 73.0 in | Wt 176.0 lb

## 2014-11-26 DIAGNOSIS — Z8501 Personal history of malignant neoplasm of esophagus: Secondary | ICD-10-CM | POA: Diagnosis not present

## 2014-11-26 DIAGNOSIS — C159 Malignant neoplasm of esophagus, unspecified: Secondary | ICD-10-CM

## 2014-11-26 NOTE — Progress Notes (Signed)
  [place] OFFICE PROGRESS NOTE   Diagnosis: Esophagus cancer  INTERVAL HISTORY:   [name] returns as scheduled. He feels well. He reports undergoing a successful vocal cord implant procedure in January. He has occasional "esophagus spasm ". Good appetite and energy level.  Objective:  Vital signs in last 24 hours:  Blood pressure 115/62, pulse 84, temperature 98 F (36.7 C), temperature source Oral, resp. rate 19, height 6\' 1"  (1.854 m), weight 176 lb (79.833 kg), SpO2 97 %.    HEENT: Neck without mass Lymphatics: No cervical, supra-clavicular, or axillary nodes Resp: Bronchial sounds on inspiration at the right greater than left posterior chest, no respiratory distress Cardio: Regular 8 and rhythm GI: No hepatosplenomegaly, no mass Vascular: No leg edema   Medications: I have reviewed the patient's current medications.  Assessment/Plan: 1. 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  1. Staging CT/PET scan evaluation showed evidence of local metastatic disease involving lymph nodes but no distant metastases. 2. Status post weekly Taxol/carboplatin chemotherapy and concurrent radiation with the last week of chemotherapy given on 08/12/2010 and radiation completed 08/18/2010.  3. Restaging PET scan December 2011 revealed an interval decrease in the size of metabolic activity of the primary esophagus tumor. No evidence of hypermetabolic lymph node or distant metastases.  4. Esophagogastrectomy 09/29/2010 with no residual carcinoma identified. 5. Restaging CT scans chest and abdomen 11/17/2011 negative for recurrent/metastatic disease. 6. Restaging CT scan 06/09/2012 negative for recurrent esophagus cancer 2. History of stage T1C prostate cancer. He continues an observation approach. He is followed by Dr. Risa Grill. 3. History of hypertension. 4. History of dysphagia and  weight loss secondary to the obstructing esophagus mass. His weight is stable. He reports no dysphagia. 5. History of vesicular lesions at the right pubic area with the appearance of herpetic lesions. He was treated with Valtrex. 6. Postoperative anastomotic leak, resolved. 7. Hoarseness, followed by Dr. Erik Obey with repeat focal cord injection therapy, last on 08/07/2013, status post a vocal cord "implant "procedure in 2016 8. Abdominal discomfort and diarrhea. Controlled with a combination of hyoscyamine, Levsin and Nexium.   Disposition:  [name] remains in clinical remission from esophagus cancer. He will return for an office visit in 8 months.    Betsy Coder, MD  11/26/2014  11:20 AM

## 2014-11-26 NOTE — Telephone Encounter (Signed)
gv and printed appt sched and avs for pt for NOV °

## 2014-12-20 ENCOUNTER — Ambulatory Visit: Payer: Medicare HMO | Admitting: Cardiothoracic Surgery

## 2014-12-27 ENCOUNTER — Ambulatory Visit: Payer: Medicare HMO | Admitting: Cardiothoracic Surgery

## 2015-01-14 DIAGNOSIS — D225 Melanocytic nevi of trunk: Secondary | ICD-10-CM | POA: Diagnosis not present

## 2015-01-14 DIAGNOSIS — L309 Dermatitis, unspecified: Secondary | ICD-10-CM | POA: Diagnosis not present

## 2015-01-14 DIAGNOSIS — L814 Other melanin hyperpigmentation: Secondary | ICD-10-CM | POA: Diagnosis not present

## 2015-01-14 DIAGNOSIS — L821 Other seborrheic keratosis: Secondary | ICD-10-CM | POA: Diagnosis not present

## 2015-01-23 DIAGNOSIS — J3801 Paralysis of vocal cords and larynx, unilateral: Secondary | ICD-10-CM | POA: Diagnosis not present

## 2015-01-23 DIAGNOSIS — J387 Other diseases of larynx: Secondary | ICD-10-CM | POA: Diagnosis not present

## 2015-02-13 DIAGNOSIS — C159 Malignant neoplasm of esophagus, unspecified: Secondary | ICD-10-CM | POA: Diagnosis not present

## 2015-02-13 DIAGNOSIS — K219 Gastro-esophageal reflux disease without esophagitis: Secondary | ICD-10-CM | POA: Diagnosis not present

## 2015-02-13 DIAGNOSIS — F1920 Other psychoactive substance dependence, uncomplicated: Secondary | ICD-10-CM | POA: Diagnosis not present

## 2015-02-20 DIAGNOSIS — J3801 Paralysis of vocal cords and larynx, unilateral: Secondary | ICD-10-CM | POA: Diagnosis not present

## 2015-02-20 DIAGNOSIS — J387 Other diseases of larynx: Secondary | ICD-10-CM | POA: Diagnosis not present

## 2015-02-25 DIAGNOSIS — K224 Dyskinesia of esophagus: Secondary | ICD-10-CM | POA: Diagnosis not present

## 2015-02-25 DIAGNOSIS — R109 Unspecified abdominal pain: Secondary | ICD-10-CM | POA: Diagnosis not present

## 2015-02-25 DIAGNOSIS — C153 Malignant neoplasm of upper third of esophagus: Secondary | ICD-10-CM | POA: Diagnosis not present

## 2015-03-27 DIAGNOSIS — J3801 Paralysis of vocal cords and larynx, unilateral: Secondary | ICD-10-CM | POA: Diagnosis not present

## 2015-03-27 DIAGNOSIS — Z8709 Personal history of other diseases of the respiratory system: Secondary | ICD-10-CM | POA: Diagnosis not present

## 2015-03-27 DIAGNOSIS — Z9889 Other specified postprocedural states: Secondary | ICD-10-CM | POA: Diagnosis not present

## 2015-04-04 ENCOUNTER — Ambulatory Visit (INDEPENDENT_AMBULATORY_CARE_PROVIDER_SITE_OTHER): Payer: Commercial Managed Care - HMO | Admitting: Cardiothoracic Surgery

## 2015-04-04 ENCOUNTER — Encounter: Payer: Self-pay | Admitting: Cardiothoracic Surgery

## 2015-04-04 VITALS — BP 120/73 | HR 76 | Resp 20 | Ht 73.0 in | Wt 173.0 lb

## 2015-04-04 DIAGNOSIS — Z8501 Personal history of malignant neoplasm of esophagus: Secondary | ICD-10-CM | POA: Diagnosis not present

## 2015-04-04 NOTE — Progress Notes (Signed)
[name]       [name]             [phone]                             [name] [facility] Medical Record #[id number] Date of Birth: [birth year]  [name] Pier, MD  [name], MD  Chief Complaint:   PostOp Follow Up Visit Transhiatal total esophagectomy with cervical esophagogastrostomy complicated by anastomotic leak and left recurrent nerve palsy by Dr. [name] January 2012 ULCERATED AND REACTIVE GASTRIC OXYNTIC MUCOSA; YPTX, PN0, PMX  History of Present Illness:     Patient returns today. He still has hoarseness and probably has a recurrent  laryngeal nerve palsy that is permanent. In January of this year he had  vocal cord procedure done  and notes improvement.  .  The patient does note especially after early morning meal having some increased heart rate and feeling lightheaded that passes quickly. Weight has been stable   History  Smoking status  . Former Smoker  . Quit date: 06/13/2009  Smokeless tobacco  . Not on file       No Known Allergies  Current Outpatient Prescriptions  Medication Sig Dispense Refill  . hyoscyamine (LEVBID) 0.375 MG 12 hr tablet Take 30 mg by mouth daily.  0  . Multiple Vitamins-Minerals (MULTIVITAMIN WITH MINERALS) tablet Take 1 tablet by mouth daily.    [name] NAPROXEN SODIUM PO     . omeprazole (PRILOSEC) 40 MG capsule     . traMADol (ULTRAM) 50 MG tablet take 1 tablet by mouth every 6 hours if needed for pain  0  . zolpidem (AMBIEN) 10 MG tablet Take 10 mg by mouth at bedtime as needed.      No current facility-administered medications for this visit.   Wt Readings from Last 3 Encounters:  04/04/15 173 lb (78.472 kg)  11/26/14 176 lb (79.833 kg)  02/26/14 171 lb 8 oz (77.792 kg)      Physical Exam: BP 120/73 mmHg  Pulse 76  Resp 20  Ht 6\' 1"  (1.854 m)  Wt 173 lb (78.472 kg)  BMI 22.83 kg/m2  SpO2 97%  General appearance: alert, cooperative and Worse while  speaking Neurologic: intact Heart: regular rate and rhythm, S1, S2 normal, no murmur, click, rub or gallop and normal apical impulse Lungs: clear to auscultation bilaterally and normal percussion bilaterally Abdomen: soft, non-tender; bowel sounds normal; no masses,  no organomegaly Extremities: extremities normal, atraumatic, no cyanosis or edema, Homans sign is negative, no sign of DVT and no edema, redness or tenderness in the calves or thighs Wound: Well-healed No cervical nodes or mass noted No abdominal hernia  Diagnostic Studies & Laboratory data:            Recent Labs: Lab Results  Component Value Date   WBC 5.8 10/19/2010   HGB 13.4 08/07/2013   HCT 43.0 08/01/2012   PLT 389 10/19/2010   GLUCOSE 95 08/01/2012   ALT 34 10/19/2010   AST 32 10/19/2010   NA 143 08/01/2012   K 4.5 08/01/2012   CL 105 08/01/2012   CREATININE 1.00 08/01/2012   BUN 16 08/01/2012   CO2 26 10/19/2010   INR 0.89 09/28/2010      Assessment / Plan:      Patient now 4 years following transhiatal total esophagectomy for distal esophageal  cancer. He was treated preoperatively with radiation and chemotherapy final path showed ulcerated gastric mucosa negative nodes ypTx,pN0,PMx. Without evidence of recurrent esophageal cancer  Followed by oncology, will see as needed     [name] 04/04/2015 11:53 AM

## 2015-04-26 DIAGNOSIS — C61 Malignant neoplasm of prostate: Secondary | ICD-10-CM | POA: Diagnosis not present

## 2015-05-15 DIAGNOSIS — E780 Pure hypercholesterolemia: Secondary | ICD-10-CM | POA: Diagnosis not present

## 2015-05-15 DIAGNOSIS — G479 Sleep disorder, unspecified: Secondary | ICD-10-CM | POA: Diagnosis not present

## 2015-05-15 DIAGNOSIS — I10 Essential (primary) hypertension: Secondary | ICD-10-CM | POA: Diagnosis not present

## 2015-05-17 DIAGNOSIS — J309 Allergic rhinitis, unspecified: Secondary | ICD-10-CM | POA: Diagnosis not present

## 2015-05-17 DIAGNOSIS — K224 Dyskinesia of esophagus: Secondary | ICD-10-CM | POA: Diagnosis not present

## 2015-05-17 DIAGNOSIS — R109 Unspecified abdominal pain: Secondary | ICD-10-CM | POA: Diagnosis not present

## 2015-05-17 DIAGNOSIS — Z0000 Encounter for general adult medical examination without abnormal findings: Secondary | ICD-10-CM | POA: Diagnosis not present

## 2015-05-17 DIAGNOSIS — N5201 Erectile dysfunction due to arterial insufficiency: Secondary | ICD-10-CM | POA: Diagnosis not present

## 2015-05-17 DIAGNOSIS — G479 Sleep disorder, unspecified: Secondary | ICD-10-CM | POA: Diagnosis not present

## 2015-06-26 DIAGNOSIS — J3801 Paralysis of vocal cords and larynx, unilateral: Secondary | ICD-10-CM | POA: Diagnosis not present

## 2015-06-26 DIAGNOSIS — J387 Other diseases of larynx: Secondary | ICD-10-CM | POA: Diagnosis not present

## 2015-06-26 DIAGNOSIS — J384 Edema of larynx: Secondary | ICD-10-CM | POA: Diagnosis not present

## 2015-06-26 DIAGNOSIS — R1310 Dysphagia, unspecified: Secondary | ICD-10-CM | POA: Diagnosis not present

## 2015-07-29 ENCOUNTER — Ambulatory Visit (HOSPITAL_BASED_OUTPATIENT_CLINIC_OR_DEPARTMENT_OTHER): Payer: Commercial Managed Care - HMO | Admitting: Nurse Practitioner

## 2015-07-29 VITALS — BP 132/70 | HR 74 | Temp 97.8°F | Resp 17 | Ht 73.0 in | Wt 174.1 lb

## 2015-07-29 DIAGNOSIS — R1310 Dysphagia, unspecified: Secondary | ICD-10-CM | POA: Diagnosis not present

## 2015-07-29 DIAGNOSIS — Z8501 Personal history of malignant neoplasm of esophagus: Secondary | ICD-10-CM

## 2015-07-29 DIAGNOSIS — C159 Malignant neoplasm of esophagus, unspecified: Secondary | ICD-10-CM

## 2015-07-29 NOTE — Progress Notes (Addendum)
  [place] OFFICE PROGRESS NOTE   Diagnosis:  Esophagus cancer  INTERVAL HISTORY:   [name] returns as scheduled. He feels well. He continues to have "random" episodes of dysphagia. No nausea or vomiting. He has a good appetite. His weight is stable. No shortness of breath. No change in bowel habits.  Objective:  Vital signs in last 24 hours:  Blood pressure 132/70, pulse 74, temperature 97.8 F (36.6 C), temperature source Oral, resp. rate 17, height 6\' 1"  (1.854 m), weight 174 lb 1.6 oz (78.971 kg), SpO2 100 %.    HEENT: No thrush or ulcers. No neck mass. Lymphatics: No palpable cervical, supraclavicular, axillary or inguinal lymph nodes. Resp: Lungs clear bilaterally. Cardio: Regular rate and rhythm. GI: Abdomen soft and nontender. No hepatomegaly. Vascular: No leg edema. Calves soft and nontender.   Lab Results:  Lab Results  Component Value Date   WBC 5.8 10/19/2010   HGB 13.4 08/07/2013   HCT 43.0 08/01/2012   MCV 92.5 10/19/2010   PLT 389 10/19/2010   NEUTROABS 2.7 08/19/2010    Imaging:  No results found.  Medications: I have reviewed the patient's current medications.  Assessment/Plan: 1. 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  1. Staging CT/PET scan evaluation showed evidence of local metastatic disease involving lymph nodes but no distant metastases. 2. Status post weekly Taxol/carboplatin chemotherapy and concurrent radiation with the last week of chemotherapy given on 08/12/2010 and radiation completed 08/18/2010.  3. Restaging PET scan December 2011 revealed an interval decrease in the size of metabolic activity of the primary esophagus tumor. No evidence of hypermetabolic lymph node or distant metastases.  4. Esophagogastrectomy 09/29/2010 with no residual carcinoma identified. 5. Restaging CT scans chest and abdomen 11/17/2011 negative  for recurrent/metastatic disease. 6. Restaging CT scan 06/09/2012 negative for recurrent esophagus cancer 2. History of stage T1C prostate cancer. He continues an observation approach. He is followed by Dr. Risa Grill. 3. History of hypertension. 4. History of dysphagia and weight loss secondary to the obstructing esophagus mass. His weight is stable. He reports no dysphagia. 5. History of vesicular lesions at the right pubic area with the appearance of herpetic lesions. He was treated with Valtrex. 6. Postoperative anastomotic leak, resolved. 7. Hoarseness, followed by Dr. Erik Obey with repeat focal cord injection therapy, last on 08/07/2013, status post a vocal cord "implant" procedure in 2016 8. History of Abdominal discomfort and diarrhea. Controlled with a combination of hyoscyamine, Levsin and Nexium.   Disposition: [name] remains in clinical remission from esophagus cancer. He is now greater than 5 years out from diagnosis. We did not schedule formal follow-up. We will see him in the future if needed.  Patient seen with Dr. Benay Spice.  Ned Card ANP/GNP-BC   07/29/2015  10:41 AM  This was a shared visit with Ned Card. [name] is now greater than 5 years out from being diagnosed with esophagus cancer. He remains in clinical remission. He will be discharged from the Oncology clinic. We are available to see him in the future as needed.  Julieanne Manson, M.D.

## 2015-08-20 DIAGNOSIS — N4231 Prostatic intraepithelial neoplasia: Secondary | ICD-10-CM | POA: Diagnosis not present

## 2015-08-20 DIAGNOSIS — C61 Malignant neoplasm of prostate: Secondary | ICD-10-CM | POA: Diagnosis not present

## 2015-09-04 DIAGNOSIS — C61 Malignant neoplasm of prostate: Secondary | ICD-10-CM | POA: Diagnosis not present

## 2015-09-23 ENCOUNTER — Observation Stay (HOSPITAL_COMMUNITY)
Admission: EM | Admit: 2015-09-23 | Discharge: 2015-09-24 | Disposition: A | Discharge status: Home / Self Care | Payer: Commercial Managed Care - HMO | Attending: Family Medicine | Admitting: Family Medicine

## 2015-09-23 ENCOUNTER — Encounter (HOSPITAL_COMMUNITY): Payer: Self-pay | Admitting: Emergency Medicine

## 2015-09-23 DIAGNOSIS — K219 Gastro-esophageal reflux disease without esophagitis: Secondary | ICD-10-CM | POA: Diagnosis not present

## 2015-09-23 DIAGNOSIS — Z79899 Other long term (current) drug therapy: Secondary | ICD-10-CM | POA: Diagnosis not present

## 2015-09-23 DIAGNOSIS — T18128A Food in esophagus causing other injury, initial encounter: Principal | ICD-10-CM | POA: Insufficient documentation

## 2015-09-23 DIAGNOSIS — Z8501 Personal history of malignant neoplasm of esophagus: Secondary | ICD-10-CM | POA: Diagnosis not present

## 2015-09-23 DIAGNOSIS — Z9049 Acquired absence of other specified parts of digestive tract: Secondary | ICD-10-CM | POA: Insufficient documentation

## 2015-09-23 DIAGNOSIS — Z87891 Personal history of nicotine dependence: Secondary | ICD-10-CM | POA: Diagnosis not present

## 2015-09-23 DIAGNOSIS — K224 Dyskinesia of esophagus: Secondary | ICD-10-CM | POA: Diagnosis present

## 2015-09-23 DIAGNOSIS — X58XXXA Exposure to other specified factors, initial encounter: Secondary | ICD-10-CM | POA: Diagnosis not present

## 2015-09-23 LAB — CBC WITH DIFFERENTIAL/PLATELET
Basophils Absolute: 0 10*3/uL (ref 0.0–0.1)
Basophils Relative: 0 %
Eosinophils Absolute: 0.1 10*3/uL (ref 0.0–0.7)
Eosinophils Relative: 1 %
HCT: 40.5 % (ref 39.0–52.0)
Hemoglobin: 13 g/dL (ref 13.0–17.0)
Lymphocytes Relative: 15 %
Lymphs Abs: 1.5 10*3/uL (ref 0.7–4.0)
MCH: 26.7 pg (ref 26.0–34.0)
MCHC: 32.1 g/dL (ref 30.0–36.0)
MCV: 83.3 fL (ref 78.0–100.0)
Monocytes Absolute: 1 10*3/uL (ref 0.1–1.0)
Monocytes Relative: 10 %
Neutro Abs: 7.3 10*3/uL (ref 1.7–7.7)
Neutrophils Relative %: 74 %
Platelets: 340 10*3/uL (ref 150–400)
RBC: 4.86 MIL/uL (ref 4.22–5.81)
RDW: 16.7 % — ABNORMAL HIGH (ref 11.5–15.5)
WBC: 9.9 10*3/uL (ref 4.0–10.5)

## 2015-09-23 LAB — BASIC METABOLIC PANEL
Anion gap: 10 (ref 5–15)
BUN: 21 mg/dL — ABNORMAL HIGH (ref 6–20)
CO2: 27 mmol/L (ref 22–32)
Calcium: 9.3 mg/dL (ref 8.9–10.3)
Chloride: 105 mmol/L (ref 101–111)
Creatinine, Ser: 1.09 mg/dL (ref 0.61–1.24)
GFR calc Af Amer: >60 mL/min (ref >60)
GFR calc non Af Amer: >60 mL/min (ref >60)
Glucose, Bld: 126 mg/dL — ABNORMAL HIGH (ref 65–99)
Potassium: 4.2 mmol/L (ref 3.5–5.1)
Sodium: 142 mmol/L (ref 135–145)

## 2015-09-23 MED ORDER — SODIUM CHLORIDE 0.9 % IV BOLUS (SEPSIS)
1000.0000 mL | Freq: Once | INTRAVENOUS | Status: AC
Start: 2015-09-24 — End: 2015-09-24
  Administered 2015-09-24: 1000 mL via INTRAVENOUS

## 2015-09-23 MED ORDER — ONDANSETRON HCL 4 MG/2ML IJ SOLN
4.0000 mg | Freq: Once | INTRAMUSCULAR | Status: AC
  Administered 2015-09-24: 4 mg via INTRAVENOUS
  Filled 2015-09-23: qty 2

## 2015-09-23 NOTE — ED Notes (Addendum)
Pt states he was treated for esphageal cancer 5 years ago and occasionally gets throat spasms. He states that for the past 8 hours he thinks he has been getting these and is unable to swallow as a result.  The feeling of the spasm is described as "when you drink too big of a swallow of water and it burns"

## 2015-09-23 NOTE — ED Provider Notes (Signed)
CSN: [id number]     Arrival date & time 09/23/15  2102 History  By signing my name below, I, Hansel Feinstein, attest that this documentation has been prepared under the direction and in the presence of Deno Etienne, DO. Electronically Signed: Hansel Feinstein, ED Scribe. 09/24/2015. 12:15 AM.    No chief complaint on file.  Patient is a 70 y.o. male presenting with general illness. The history is provided by the patient. No language interpreter was used.  Illness Location:  Esophageal spasms Quality:  Aching pain Severity:  Moderate Onset quality:  Gradual Duration:  12 hours Timing:  Constant Progression:  Unchanged Chronicity:  Recurrent Context:  H/o similar spasms, esophageal cancer  Relieved by:  Nothing Worsened by:  Cough Ineffective treatments:  NTG  Associated symptoms: cough and sore throat   Associated symptoms: no abdominal pain, no chest pain, no congestion, no diarrhea, no fever, no headaches, no myalgias, no rash, no shortness of breath and no vomiting   Risk factors:  H/o similar, but not as long lasting    HPI Comments: [name] is a 70 y.o. male with h/o GERD, esophageal swelling who presents to the Emergency Department complaining of difficulty swallowing onset at 1PM while eating with associated cough, aching throat pain secondary to coughing. Pt states that he is regurgitating his saliva. He states h/o similar spasms, but not lasting longer than 2 hours. He takes 0.4 mg NTG with relief of prior spasms, but notes no relief tonight d/t inability to retain saliva tonight. He denies a foreign body sensation. Pt states h/o esophageal surgery. He is followed by HENT. Wife reports that they have not consulted their HENT because the offices have been closed today.   Past Medical History  Diagnosis Date  . GERD (gastroesophageal reflux disease)   . Esophageal cancer ([place])     and prostate  . Hoarseness   . Abdominal discomfort   . Dysrhythmia     occ pac  . Wears hearing  aid    Past Surgical History  Procedure Laterality Date  . Esophagectomy  10/02/2010    Dr.Burney  . Pyloroplasty  09/29/2010    Dr.Burney  . Jejunostomy  09/29/2010    Dr.Burney  . Direct laryngoscopy with radiaesse injection  02/08/2012    Procedure: DIRECT LARYNGOSCOPY WITH RADIAESSE INJECTION;  Surgeon: Jodi Marble, MD;  Location: [place];  Service: ENT;  Laterality: Bilateral;  microdirect-laryngosocopy  with bilateral vocal cord radiesse injection  . Direct laryngoscopy with radiaesse injection  03/21/2012    Procedure: DIRECT LARYNGOSCOPY WITH RADIAESSE INJECTION;  Surgeon: Jodi Marble, MD;  Location: [place];  Service: ENT;  Laterality: N/A;  microdirect laryngoscopy with radiaesse injection left vocal cord  . Esophagoscopy  03/21/2012    Procedure: ESOPHAGOSCOPY;  Surgeon: Jodi Marble, MD;  Location: [place];  Service: ENT;  Laterality: N/A;  . Microlaryngoscopy w/vocal cord injection  08/01/2012    Procedure: MICROLARYNGOSCOPY WITH VOCAL CORD INJECTION;  Surgeon: Jodi Marble, MD;  Location: [place];  Service: ENT;  Laterality: Left;  MICRO DIRECT LARYNGOSCOPY  AND RADIESSE  INJECTION   . Direct laryngoscopy with radiaesse injection N/A 01/23/2013    Procedure: DIRECT LARYNGOSCOPY WITH RADIESSE VOCAL CORD AUGMENTATION LARYNGOPLASTY;  Surgeon: Jodi Marble, MD;  Location: [place];  Service: ENT;  Laterality: N/A;  . Microlaryngoscopy w/vocal cord injection N/A 08/07/2013    Procedure: MICROLARYNGOSCOPY WITH RADIESSE VOCAL CORD AUGMENTATION ;  Surgeon: [name]  Erik Obey, MD;  Location: [place];  Service: ENT;  Laterality: N/A;  . Microlaryngoscopy w/vocal cord injection N/A 02/19/2014    Procedure: MICROLARYNGOSCOPY WITH BILATERAL RADIESSE  VOCAL CORD AUGMENTATION;  Surgeon: Jodi Marble, MD;  Location: [place];  Service: ENT;  Laterality: N/A;   No family history  on file. Social History  Substance Use Topics  . Smoking status: Former Smoker    Quit date: 06/13/2009  . Smokeless tobacco: None  . Alcohol Use: No    Review of Systems  Constitutional: Negative for fever and chills.  HENT: Positive for sore throat and trouble swallowing. Negative for congestion and facial swelling.   Eyes: Negative for discharge and visual disturbance.  Respiratory: Positive for cough. Negative for shortness of breath.   Cardiovascular: Negative for chest pain and palpitations.  Gastrointestinal: Negative for vomiting, abdominal pain and diarrhea.  Musculoskeletal: Negative for myalgias and arthralgias.  Skin: Negative for color change and rash.  Neurological: Negative for tremors, syncope and headaches.  Psychiatric/Behavioral: Negative for confusion and dysphoric mood.  All other systems reviewed and are negative.  Allergies  Review of patient's allergies indicates no known allergies.  Home Medications   Prior to Admission medications   Medication Sig Start Date End Date Taking? Authorizing Provider  hyoscyamine (LEVBID) 0.375 MG 12 hr tablet Take 30 mg by mouth daily. 11/22/14  Yes Historical Provider, MD  magnesium 30 MG tablet Take 30 mg by mouth 2 (two) times daily.   Yes Historical Provider, MD  Multiple Vitamins-Minerals (ANTIOXIDANT FORMULA) TABS Take 1 tablet by mouth daily.   Yes Historical Provider, MD  Multiple Vitamins-Minerals (MULTIVITAMIN WITH MINERALS) tablet Take 1 tablet by mouth daily.   Yes Historical Provider, MD  nitroGLYCERIN (NITROSTAT) 0.4 MG SL tablet Place 0.4 mg under the tongue every 5 (five) minutes as needed for chest pain.  09/23/15  Yes Historical Provider, MD  omeprazole (PRILOSEC) 40 MG capsule Take 40 mg by mouth daily.  01/21/14  Yes Historical Provider, MD  traMADol (ULTRAM) 50 MG tablet take 0.5 tablet (25 mg) by mouth every 6 hours if needed for pain 02/25/15  Yes Historical Provider, MD  zolpidem (AMBIEN) 10 MG tablet Take 5  mg by mouth at bedtime as needed for sleep.  02/02/12  Yes Historical Provider, MD   BP 148/84 mmHg  Pulse 107  Temp(Src) 98.5 F (36.9 C) (Oral)  Resp 18  Ht 6' (1.829 m)  Wt 170 lb (77.111 kg)  BMI 23.05 kg/m2  SpO2 98% Physical Exam  Constitutional: He is oriented to person, place, and time. He appears well-developed and well-nourished.  HENT:  Head: Normocephalic and atraumatic.  Mouth/Throat: Oropharynx is clear and moist. No posterior oropharyngeal edema or posterior oropharyngeal erythema.  No oral secretions. No erythema or swelling to posterior oropharynx.   Eyes: Conjunctivae and EOM are normal. Pupils are equal, round, and reactive to light.  Neck: Normal range of motion. Neck supple.  Cardiovascular: Normal rate.   Pulmonary/Chest: Effort normal and breath sounds normal. No respiratory distress. He has no wheezes. He has no rales. He exhibits no tenderness.  Lungs CTA bilaterally.   Abdominal: He exhibits no distension. There is no tenderness.  No abdominal TTP.   Musculoskeletal: Normal range of motion.  Neurological: He is alert and oriented to person, place, and time.  Skin: Skin is warm and dry.  Psychiatric: He has a normal mood and affect. His behavior is normal.  Nursing note and vitals reviewed.  ED Course  Procedures (including critical care time) DIAGNOSTIC STUDIES: Oxygen Saturation is 100% on RA, normal by my interpretation.    COORDINATION OF CARE: 12:13 AM Discussed treatment plan with pt at bedside and pt agreed to plan.   Labs Review Labs Reviewed  CBC WITH DIFFERENTIAL/PLATELET - Abnormal; Notable for the following:    RDW 16.7 (*)    All other components within normal limits  BASIC METABOLIC PANEL - Abnormal; Notable for the following:    Glucose, Bld 126 (*)    BUN 21 (*)    All other components within normal limits    Imaging Review No results found. I have personally reviewed and evaluated these images and lab results as part of my  medical decision-making.   EKG Interpretation None      MDM   Final diagnoses:  Esophageal spasm    70 yo M with a chief complaint of esophageal spasm. Patient has a history of this in the past was never lasted this long. Patient was given diltiazem nitroglycerin lidocaine with minimal relief. Still unable to swallow any liquids on the ED. Discussed the case with Dr. Amedeo Plenty, gastroenterology. Difficult to ascertain whether or not this is a food impaction as patient only has these symptoms when he is eating. However patient denied any large bites at that time. Will admit the patient GI to see in the morning.  The patients results and plan were reviewed and discussed.   Any x-rays performed were independently reviewed by myself.   Differential diagnosis were considered with the presenting HPI.  Medications  nitroGLYCERIN (NITROSTAT) SL tablet 0.4 mg (0.4 mg Sublingual Given 09/24/15 0141)  lidocaine (XYLOCAINE) 2 % viscous mouth solution 15 mL (15 mLs Mouth/Throat Given 09/24/15 0420)  magnesium oxide (MAG-OX) tablet 200 mg (not administered)  hyoscyamine (LEVBID) 0.375 MG 12 hr tablet 30 mg (not administered)  pantoprazole (PROTONIX) EC tablet 40 mg (not administered)  zolpidem (AMBIEN) tablet 5 mg (not administered)  heparin injection 5,000 Units (not administered)  0.9 %  sodium chloride infusion ( Intravenous New Bag/Given 09/24/15 0453)  morphine 2 MG/ML injection 1 mg (not administered)  ketorolac (TORADOL) 15 MG/ML injection 15 mg (not administered)  sodium chloride 0.9 % bolus 1,000 mL (0 mLs Intravenous Stopped 09/24/15 0413)  ondansetron (ZOFRAN) injection 4 mg (4 mg Intravenous Given 09/24/15 0027)  lidocaine (XYLOCAINE) 2 % viscous mouth solution 15 mL (15 mLs Mouth/Throat Given 09/24/15 0027)  lidocaine (XYLOCAINE) 4 % (PF) injection 5 mL (5 mLs Other Given 09/24/15 0046)  ketorolac (TORADOL) 30 MG/ML injection 15 mg (15 mg Intravenous Given 09/24/15 0027)  diltiazem (CARDIZEM)  injection 5 mg (5 mg Intravenous Given 09/24/15 0046)  ketorolac (TORADOL) 30 MG/ML injection 15 mg (15 mg Intravenous Given 09/24/15 0414)    Filed Vitals:   09/23/15 2333 09/24/15 0103 09/24/15 0241 09/24/15 0436  BP: 148/84 138/87 112/68 134/65  Pulse: 107 102 89 95  Temp:    98.4 F (36.9 C)  TempSrc:    Oral  Resp: 18 18 18 16   Height:    6' (1.829 m)  Weight:    170 lb (77.111 kg)  SpO2: 98% 100% 95% 100%    Final diagnoses:  None    Admission/ observation were discussed with the admitting physician, patient and/or family and they are comfortable with the plan.   I personally performed the services described in this documentation, which was scribed in my presence. The recorded information has been reviewed and is accurate.  Deno Etienne, DO 09/24/15 [phone]

## 2015-09-23 NOTE — ED Notes (Signed)
Patient presents with oral spams x8 hours, history of esophageal cancer (5 years ago). History of spasms, normally lasting 30 minutes. Unable to swallow own secretions or pass fluids.

## 2015-09-23 NOTE — ED Notes (Signed)
Writer was unable to do PO challenge due to the fact he is vomiting

## 2015-09-24 ENCOUNTER — Observation Stay (HOSPITAL_COMMUNITY): Payer: Commercial Managed Care - HMO | Admitting: Anesthesiology

## 2015-09-24 ENCOUNTER — Encounter (HOSPITAL_COMMUNITY)
Admission: EM | Disposition: A | Discharge status: Home / Self Care | Payer: Self-pay | Source: Home / Self Care | Attending: Emergency Medicine

## 2015-09-24 ENCOUNTER — Encounter (HOSPITAL_COMMUNITY)
Admission: EM | Disposition: A | Discharge status: Home / Self Care | Payer: Commercial Managed Care - HMO | Source: Home / Self Care | Attending: Emergency Medicine

## 2015-09-24 ENCOUNTER — Encounter (HOSPITAL_COMMUNITY): Payer: Self-pay

## 2015-09-24 DIAGNOSIS — K224 Dyskinesia of esophagus: Secondary | ICD-10-CM | POA: Diagnosis not present

## 2015-09-24 DIAGNOSIS — T18108A Unspecified foreign body in esophagus causing other injury, initial encounter: Secondary | ICD-10-CM | POA: Diagnosis not present

## 2015-09-24 DIAGNOSIS — K219 Gastro-esophageal reflux disease without esophagitis: Secondary | ICD-10-CM | POA: Diagnosis not present

## 2015-09-24 DIAGNOSIS — T18128A Food in esophagus causing other injury, initial encounter: Secondary | ICD-10-CM | POA: Diagnosis not present

## 2015-09-24 DIAGNOSIS — T18120A Food in esophagus causing compression of trachea, initial encounter: Secondary | ICD-10-CM | POA: Diagnosis not present

## 2015-09-24 DIAGNOSIS — Z8501 Personal history of malignant neoplasm of esophagus: Secondary | ICD-10-CM | POA: Diagnosis not present

## 2015-09-24 HISTORY — PX: ESOPHAGOGASTRODUODENOSCOPY: SHX5428

## 2015-09-24 LAB — PROTIME-INR
INR: 1.11 (ref 0.00–1.49)
Prothrombin Time: 14.5 seconds (ref 11.6–15.2)

## 2015-09-24 SURGERY — ESOPHAGOGASTRODUODENOSCOPY (EGD) WITH PROPOFOL
Anesthesia: Monitor Anesthesia Care

## 2015-09-24 SURGERY — EGD (ESOPHAGOGASTRODUODENOSCOPY)
Anesthesia: General

## 2015-09-24 MED ORDER — ONDANSETRON HCL 4 MG/2ML IJ SOLN
INTRAMUSCULAR | Status: DC | PRN
  Administered 2015-09-24: 4 mg via INTRAVENOUS

## 2015-09-24 MED ORDER — KETOROLAC TROMETHAMINE 15 MG/ML IJ SOLN: 15.0000 mg | Freq: Four times a day (QID) | INTRAMUSCULAR | Status: DC | PRN

## 2015-09-24 MED ORDER — HEPARIN SODIUM (PORCINE) 5000 UNIT/ML IJ SOLN
5000.0000 [IU] | Freq: Three times a day (TID) | INTRAMUSCULAR | Status: DC
  Filled 2015-09-24 (×4): qty 1

## 2015-09-24 MED ORDER — NITROGLYCERIN 0.4 MG SL SUBL
0.4000 mg | SUBLINGUAL_TABLET | SUBLINGUAL | Status: DC | PRN
Start: 2015-09-24 — End: 2015-09-24
  Administered 2015-09-24: 0.4 mg via SUBLINGUAL
  Filled 2015-09-24: qty 1

## 2015-09-24 MED ORDER — PANTOPRAZOLE SODIUM 40 MG PO TBEC: 40.0000 mg | DELAYED_RELEASE_TABLET | Freq: Every day | ORAL | Status: DC

## 2015-09-24 MED ORDER — DILTIAZEM HCL 25 MG/5ML IV SOLN
5.0000 mg | Freq: Once | INTRAVENOUS | Status: AC
  Administered 2015-09-24: 5 mg via INTRAVENOUS
  Filled 2015-09-24: qty 5

## 2015-09-24 MED ORDER — LIDOCAINE VISCOUS 2 % MT SOLN
15.0000 mL | Freq: Once | OROMUCOSAL | Status: AC
  Administered 2015-09-24: 15 mL via OROMUCOSAL
  Filled 2015-09-24: qty 15

## 2015-09-24 MED ORDER — SUCCINYLCHOLINE CHLORIDE 20 MG/ML IJ SOLN
INTRAMUSCULAR | Status: DC | PRN
  Administered 2015-09-24: 100 mg via INTRAVENOUS

## 2015-09-24 MED ORDER — LIDOCAINE VISCOUS 2 % MT SOLN
15.0000 mL | OROMUCOSAL | Status: DC | PRN
  Administered 2015-09-24: 15 mL via OROMUCOSAL
  Filled 2015-09-24 (×3): qty 15

## 2015-09-24 MED ORDER — DEXAMETHASONE SODIUM PHOSPHATE 10 MG/ML IJ SOLN
INTRAMUSCULAR | Status: AC
  Filled 2015-09-24: qty 1

## 2015-09-24 MED ORDER — ZOLPIDEM TARTRATE 5 MG PO TABS: 5.0000 mg | ORAL_TABLET | Freq: Every evening | ORAL | Status: DC | PRN

## 2015-09-24 MED ORDER — KETOROLAC TROMETHAMINE 30 MG/ML IJ SOLN
15.0000 mg | Freq: Once | INTRAMUSCULAR | Status: AC
  Administered 2015-09-24: 15 mg via INTRAVENOUS
  Filled 2015-09-24: qty 1

## 2015-09-24 MED ORDER — LIDOCAINE HCL (CARDIAC) 20 MG/ML IV SOLN
INTRAVENOUS | Status: DC | PRN
  Administered 2015-09-24: 50 mg via INTRAVENOUS

## 2015-09-24 MED ORDER — MORPHINE SULFATE (PF) 2 MG/ML IV SOLN: 1.0000 mg | INTRAVENOUS | Status: DC | PRN

## 2015-09-24 MED ORDER — MAGNESIUM OXIDE 400 (241.3 MG) MG PO TABS
200.0000 mg | ORAL_TABLET | Freq: Two times a day (BID) | ORAL | Status: DC
  Filled 2015-09-24 (×2): qty 0.5

## 2015-09-24 MED ORDER — HYOSCYAMINE SULFATE ER 0.375 MG PO TB12
30.0000 mg | ORAL_TABLET | Freq: Every day | ORAL | Status: DC
  Filled 2015-09-24: qty 80

## 2015-09-24 MED ORDER — PROPOFOL 10 MG/ML IV BOLUS
INTRAVENOUS | Status: DC | PRN
  Administered 2015-09-24: 200 mg via INTRAVENOUS

## 2015-09-24 MED ORDER — SODIUM CHLORIDE 0.9 % IV SOLN
INTRAVENOUS | Status: DC
  Administered 2015-09-24 (×2): via INTRAVENOUS

## 2015-09-24 MED ORDER — LIDOCAINE HCL (PF) 4 % IJ SOLN
5.0000 mL | Freq: Once | INTRAMUSCULAR | Status: AC
  Administered 2015-09-24: 5 mL
  Filled 2015-09-24: qty 5

## 2015-09-24 MED ORDER — ONDANSETRON HCL 4 MG/2ML IJ SOLN
INTRAMUSCULAR | Status: AC
  Filled 2015-09-24: qty 2

## 2015-09-24 MED ORDER — DEXAMETHASONE SODIUM PHOSPHATE 10 MG/ML IJ SOLN
INTRAMUSCULAR | Status: DC | PRN
  Administered 2015-09-24: 10 mg via INTRAVENOUS

## 2015-09-24 NOTE — Anesthesia Postprocedure Evaluation (Signed)
Anesthesia Post Note  Patient: [name]  Procedure(s) Performed: Procedure(s) (LRB): ESOPHAGOGASTRODUODENOSCOPY (EGD) (N/A)  Patient location during evaluation: PACU Anesthesia Type: General Level of consciousness: awake and alert Pain management: pain level controlled Vital Signs Assessment: post-procedure vital signs reviewed and stable Respiratory status: spontaneous breathing, nonlabored ventilation, respiratory function stable and patient connected to nasal cannula oxygen Cardiovascular status: blood pressure returned to baseline and stable Postop Assessment: no signs of nausea or vomiting Anesthetic complications: no    Last Vitals:  Filed Vitals:   09/24/15 1050 09/24/15 1113  BP: 114/66 126/61  Pulse: 94 94  Temp:  37.1 C  Resp: 9 14    Last Pain:  Filed Vitals:   09/24/15 1113  PainSc: 4                  Aneya Daddona J

## 2015-09-24 NOTE — H&P (Signed)
[facility] History and Physical  [name] [id number] DOB: 1946-02-09 DOA: 09/23/2015  Referring physician: ED PCP:  [name] Crutch, MD   Chief Complaint: Esophageal spasm  HPI:  [name] is a 70 year old male with past medical history of esophageal cancer s/p resection in 2012; presents with complaints of esophageal spasm. Symptoms started around 1 PM all having lunch. He reports difficulty with swallowing. Symptoms are reported to not necessarily constant, but persistent enough that the patient is not able to get any relief. Associated symptoms include coughing and aching throat pain. Unable to clear secretions having to spit multiple times. He tried nitroglycerin without relief of symptoms. Patient actually took 5 nitroglycerin tablets over a 6 hour period. Patient notes that nitroglycerin had previously worked in the past for symptoms like this. He's had these symptoms over the last 5 years or so and they have never lasted longer than a hour or two. He is unable to eat or drink anything at this period in time. Due to the pain and inability to swallow anything and they brought him to the emergency department for further evaluation. He denies swallowing any chunks of food. States that he is able to breathe fine without any issues. Upon admission patient was tried on nitroglycerin, IV Cardizem,  lidocaine, and Toradol without complete relief of symptoms. Chest x-ray appear to be normal.    Review of Systems  Constitutional: Negative for fever and diaphoresis.  HENT: Positive for hearing loss and sore throat.   Eyes: Negative for double vision and photophobia.  Respiratory: Positive for cough and sputum production. Negative for shortness of breath and stridor.   Cardiovascular: Negative for chest pain, orthopnea and claudication.  Gastrointestinal: Negative for vomiting, abdominal pain, diarrhea and constipation.  Genitourinary: Negative for urgency and frequency.   Musculoskeletal: Positive for neck pain. Negative for back pain.  Skin: Negative for itching and rash.  Neurological: Negative for focal weakness, seizures and loss of consciousness.  Endo/Heme/Allergies: Negative for environmental allergies. Does not bruise/bleed easily.  Psychiatric/Behavioral: Negative for hallucinations and substance abuse.         Past Medical History  Diagnosis Date  . GERD (gastroesophageal reflux disease)   . Esophageal cancer ([place])     and prostate  . Hoarseness   . Abdominal discomfort   . Dysrhythmia     occ pac  . Wears hearing aid      Past Surgical History  Procedure Laterality Date  . Esophagectomy  10/02/2010    Dr.Burney  . Pyloroplasty  09/29/2010    Dr.Burney  . Jejunostomy  09/29/2010    Dr.Burney  . Direct laryngoscopy with radiaesse injection  02/08/2012    Procedure: DIRECT LARYNGOSCOPY WITH RADIAESSE INJECTION;  Surgeon: Jodi Marble, MD;  Location: [place];  Service: ENT;  Laterality: Bilateral;  microdirect-laryngosocopy  with bilateral vocal cord radiesse injection  . Direct laryngoscopy with radiaesse injection  03/21/2012    Procedure: DIRECT LARYNGOSCOPY WITH RADIAESSE INJECTION;  Surgeon: Jodi Marble, MD;  Location: [place];  Service: ENT;  Laterality: N/A;  microdirect laryngoscopy with radiaesse injection left vocal cord  . Esophagoscopy  03/21/2012    Procedure: ESOPHAGOSCOPY;  Surgeon: Jodi Marble, MD;  Location: [place];  Service: ENT;  Laterality: N/A;  . Microlaryngoscopy w/vocal cord injection  08/01/2012    Procedure: MICROLARYNGOSCOPY WITH VOCAL CORD INJECTION;  Surgeon: Jodi Marble, MD;  Location: [place];  Service: ENT;  Laterality: Left;  MICRO DIRECT  LARYNGOSCOPY  AND RADIESSE  INJECTION   . Direct laryngoscopy with radiaesse injection N/A 01/23/2013    Procedure: DIRECT LARYNGOSCOPY WITH RADIESSE VOCAL CORD AUGMENTATION LARYNGOPLASTY;   Surgeon: Jodi Marble, MD;  Location: [place];  Service: ENT;  Laterality: N/A;  . Microlaryngoscopy w/vocal cord injection N/A 08/07/2013    Procedure: MICROLARYNGOSCOPY WITH RADIESSE VOCAL CORD AUGMENTATION ;  Surgeon: Jodi Marble, MD;  Location: [place];  Service: ENT;  Laterality: N/A;  . Microlaryngoscopy w/vocal cord injection N/A 02/19/2014    Procedure: MICROLARYNGOSCOPY WITH BILATERAL RADIESSE  VOCAL CORD AUGMENTATION;  Surgeon: Jodi Marble, MD;  Location: [place];  Service: ENT;  Laterality: N/A;      Social History:  reports that he quit smoking about 6 years ago. He does not have any smokeless tobacco history on file. He reports that he does not drink alcohol or use illicit drugs. Where does patient live--home   Can patient participate in ADLs? Yes  No Known Allergies  No family history on file.     Prior to Admission medications   Medication Sig Start Date End Date Taking? Authorizing Provider  hyoscyamine (LEVBID) 0.375 MG 12 hr tablet Take 30 mg by mouth daily. 11/22/14  Yes Historical Provider, MD  magnesium 30 MG tablet Take 30 mg by mouth 2 (two) times daily.   Yes Historical Provider, MD  Multiple Vitamins-Minerals (ANTIOXIDANT FORMULA) TABS Take 1 tablet by mouth daily.   Yes Historical Provider, MD  Multiple Vitamins-Minerals (MULTIVITAMIN WITH MINERALS) tablet Take 1 tablet by mouth daily.   Yes Historical Provider, MD  nitroGLYCERIN (NITROSTAT) 0.4 MG SL tablet Place 0.4 mg under the tongue every 5 (five) minutes as needed for chest pain.  09/23/15  Yes Historical Provider, MD  omeprazole (PRILOSEC) 40 MG capsule Take 40 mg by mouth daily.  01/21/14  Yes Historical Provider, MD  traMADol (ULTRAM) 50 MG tablet take 0.5 tablet (25 mg) by mouth every 6 hours if needed for pain 02/25/15  Yes Historical Provider, MD  zolpidem (AMBIEN) 10 MG tablet Take 5 mg by mouth at bedtime as needed for sleep.  02/02/12  Yes Historical  Provider, MD     Physical Exam: Filed Vitals:   09/23/15 2333 09/24/15 0103 09/24/15 0241 09/24/15 0436  BP: 148/84 138/87 112/68 134/65  Pulse: 107 102 89 95  Temp:    98.4 F (36.9 C)  TempSrc:    Oral  Resp: 18 18 18 16   Height:    6' (1.829 m)  Weight:    77.111 kg (170 lb)  SpO2: 98% 100% 95% 100%     Constitutional: Vital signs reviewed. Patient is a well-developed and well-nourished in no acute distress and cooperative with exam. Alert and oriented x3.  Head: Normocephalic and atraumatic  Ear: hearing aids present Mouth:  Patient frequently has to cough and spits oral secretions. no posterior oropharynx swelling or erythema noted. Eyes: PERRL, EOMI, conjunctivae normal, No scleral icterus.  Neck: Supple, Trachea midline normal ROM, No JVD, mass, thyromegaly, or carotid bruit present. No stridor Cardiovascular: RRR, S1 normal, S2 normal, no MRG, pulses symmetric and intact bilaterally  Pulmonary/Chest: CTAB, no wheezes, rales, or rhonchi  Abdominal: Soft. Non-tender, non-distended, bowel sounds are normal, no masses, organomegaly, or guarding present.  GU: no CVA tenderness Musculoskeletal: No joint deformities, erythema, or stiffness, ROM full and no nontender Ext: no edema and no cyanosis, pulses palpable bilaterally (DP and PT)  Hematology: no cervical, inginal, or axillary  adenopathy.  Neurological: A&O x3, Strenght is normal and symmetric bilaterally, cranial nerve II-XII are grossly intact, no focal motor deficit, sensory intact to light touch bilaterally.  Skin: Warm, dry and intact. No rash, cyanosis, or clubbing.  Psychiatric: Normal mood and affect. speech and behavior is normal. Judgment and thought content normal. Cognition and memory are normal.      Data Review   Micro Results No results found for this or any previous visit (from the past 240 hour(s)).  Radiology Reports No results found.   CBC  Recent Labs Lab 09/23/15 2316  WBC 9.9  HGB 13.0   HCT 40.5  PLT 340  MCV 83.3  MCH 26.7  MCHC 32.1  RDW 16.7*  LYMPHSABS 1.5  MONOABS 1.0  EOSABS 0.1  BASOSABS 0.0    Chemistries   Recent Labs Lab 09/23/15 2316  NA 142  K 4.2  CL 105  CO2 27  GLUCOSE 126*  BUN 21*  CREATININE 1.09  CALCIUM 9.3   ------------------------------------------------------------------------------------------------------------------ estimated creatinine clearance is 68.8 mL/min (by C-G formula based on Cr of 1.09). ------------------------------------------------------------------------------------------------------------------ No results for input(s): HGBA1C in the last 72 hours. ------------------------------------------------------------------------------------------------------------------ No results for input(s): CHOL, HDL, LDLCALC, TRIG, CHOLHDL, LDLDIRECT in the last 72 hours. ------------------------------------------------------------------------------------------------------------------ No results for input(s): TSH, T4TOTAL, T3FREE, THYROIDAB in the last 72 hours.  Invalid input(s): FREET3 ------------------------------------------------------------------------------------------------------------------ No results for input(s): VITAMINB12, FOLATE, FERRITIN, TIBC, IRON, RETICCTPCT in the last 72 hours.  Coagulation profile No results for input(s): INR, PROTIME in the last 168 hours.  No results for input(s): DDIMER in the last 72 hours.  Cardiac Enzymes No results for input(s): CKMB, TROPONINI, MYOGLOBIN in the last 168 hours.  Invalid input(s): CK ------------------------------------------------------------------------------------------------------------------ Invalid input(s): POCBNP   CBG: No results for input(s): GLUCAP in the last 168 hours.     [id number]   Assessment/Plan Active Problems:    Esophageal spasm: Unresolved. Patient with complaints of difficulty swallowing have persisted despite giving  nitroglycerin, calcium channel blocker, lidocaine, and Toradol.  - Admit to a MedSurg bed - Patient to be NPO for now - IV fluids and normal saline at 100 ml/ hour - Continue lidocaine gargle PRN - Toradol every 6 hours PRN moderate pain/ Morphine PRN severe pain - Follow-up GI recommendations in a.m.  Code Status:   full Family Communication: bedside Disposition Plan: admit   Total time spent 55 minutes.Greater than 50% of this time was spent in counseling, explanation of diagnosis, planning of further management, and coordination of care  [facility] Pager [phone]  If 7PM-7AM, please contact night-coverage [email or web address] Password [facility] 09/24/2015, 4:47 AM

## 2015-09-24 NOTE — Discharge Summary (Signed)
Physician Discharge Summary  [name] [id number] DOB: 1945/11/08 DOA: 09/23/2015  PCP:  [name], MD  Admit date: 09/23/2015 Discharge date: 09/24/2015  Time spent: 25* minutes  Recommendations for Outpatient Follow-up:  1. Follow up PCP in 2 weeks   Discharge Diagnoses:  Active Problems:   Esophageal spasm   Discharge Condition: Stable  Diet recommendation: Liquid diet for one day then advance to mechanical soft diet  Filed Weights   09/23/15 2109 09/24/15 0436 09/24/15 0913  Weight: 77.111 kg (170 lb) 77.111 kg (170 lb) 77.111 kg (170 lb)    History of present illness:  70 year old male with past medical history of esophageal cancer s/p resection in 2012; presents with complaints of esophageal spasm. Symptoms started around 1 PM all having lunch. He reports difficulty with swallowing. Symptoms are reported to not necessarily constant, but persistent enough that the patient is not able to get any relief. Associated symptoms include coughing and aching throat pain. Unable to clear secretions having to spit multiple times. He tried nitroglycerin without relief of symptoms. Patient actually took 5 nitroglycerin tablets over a 6 hour period. Patient notes that nitroglycerin had previously worked in the past for symptoms like this. He's had these symptoms over the last 5 years or so and they have never lasted longer than a hour or two. He is unable to eat or drink anything at this period in time. Due to the pain and inability to swallow anything and they brought him to the emergency department for further evaluation. He denies swallowing any chunks of food. States that he is able to breathe fine without any issues.  Hospital Course:  Patient was seen by GI and underwent UGI endoscopy, findings consistent with food impaction above the surgical anastomosis, relieved with the endoscope. No evidence of tumor or persistent food in esophagus.  Patient started on liquid diet, and will  be discharged home as per GI recommendation.  Procedures:  UGI endoscopy  Consultations:  GI  Discharge Exam: Filed Vitals:   09/24/15 1050 09/24/15 1113  BP: 114/66 126/61  Pulse: 94 94  Temp:  98.7 F (37.1 C)  Resp: 9 14    General: Appears in no acute distress Cardiovascular: S1S2 RRR Respiratory: Clear bilaterally  Discharge Instructions   Discharge Instructions    Diet general    Complete by:  As directed      Discharge instructions    Complete by:  As directed   C  Clear liquid diet for today then advance to mechanical soft diet     Increase activity slowly    Complete by:  As directed           Current Discharge Medication List    CONTINUE these medications which have NOT CHANGED   Details  hyoscyamine (LEVBID) 0.375 MG 12 hr tablet Take 0.375 mg by mouth as directed. 1 tablet every 12 to 24 hours (per retail pharmacy) Refills: 0    magnesium 30 MG tablet Take 30 mg by mouth 2 (two) times daily.    !! Multiple Vitamins-Minerals (ANTIOXIDANT FORMULA) TABS Take 1 tablet by mouth daily.    !! Multiple Vitamins-Minerals (MULTIVITAMIN WITH MINERALS) tablet Take 1 tablet by mouth daily.    nitroGLYCERIN (NITROSTAT) 0.4 MG SL tablet Place 0.4 mg under the tongue every 5 (five) minutes as needed for chest pain.     omeprazole (PRILOSEC) 40 MG capsule Take 40 mg by mouth daily.     traMADol (ULTRAM) 50 MG tablet take  0.5 tablet (25 mg) by mouth every 6 hours if needed for pain Refills: 0    zolpidem (AMBIEN) 10 MG tablet Take 5 mg by mouth at bedtime as needed for sleep.      !! - Potential duplicate medications found. Please discuss with provider.     No Known Allergies    The results of significant diagnostics from this hospitalization (including imaging, microbiology, ancillary and laboratory) are listed below for reference.    Significant Diagnostic Studies: No results found.  Microbiology: No results found for this or any previous visit  (from the past 240 hour(s)).   Labs: Basic Metabolic Panel:  Recent Labs Lab 09/23/15 2316  NA 142  K 4.2  CL 105  CO2 27  GLUCOSE 126*  BUN 21*  CREATININE 1.09  CALCIUM 9.3   Liver Function Tests: No results for input(s): AST, ALT, ALKPHOS, BILITOT, PROT, ALBUMIN in the last 168 hours. No results for input(s): LIPASE, AMYLASE in the last 168 hours. No results for input(s): AMMONIA in the last 168 hours. CBC:  Recent Labs Lab 09/23/15 2316  WBC 9.9  NEUTROABS 7.3  HGB 13.0  HCT 40.5  MCV 83.3  PLT 340    CBG: No results for input(s): GLUCAP in the last 168 hours.     SignedOswald Hillock MD   [facility] 09/24/2015, 12:52 PM

## 2015-09-24 NOTE — Progress Notes (Signed)
EGD performed, findings consistent with food impaction above the surgical anastomosis, relieved with the endoscope. No evidence of tumor or persistent food in esophagus. Patient should be on liquid diet the remainder the day and advance to soft mechanical. Okay to discharge today from GI standpoint.

## 2015-09-24 NOTE — Transfer of Care (Signed)
Immediate Anesthesia Transfer of Care Note  Patient: [name]  Procedure(s) Performed: Procedure(s): ESOPHAGOGASTRODUODENOSCOPY (EGD) (N/A)  Patient Location: PACU  Anesthesia Type:General  Level of Consciousness:  sedated, patient cooperative and responds to stimulation  Airway & Oxygen Therapy:Patient Spontanous Breathing and Patient connected to face mask oxgen  Post-op Assessment:  Report given to PACU RN and Post -op Vital signs reviewed and stable  Post vital signs:  Reviewed and stable  Last Vitals:  Filed Vitals:   09/24/15 0436 09/24/15 0913  BP: 134/65 135/72  Pulse: 95 94  Temp: 36.9 C 36.9 C  Resp: 16 17    Complications: No apparent anesthesia complications

## 2015-09-24 NOTE — Interval H&P Note (Signed)
History and Physical Interval Note:  09/24/2015 9:47 AM  [name]  has presented today for surgery, with the diagnosis of food impaction  The various methods of treatment have been discussed with the patient and family. After consideration of risks, benefits and other options for treatment, the patient has consented to  Procedure(s): ESOPHAGOGASTRODUODENOSCOPY (EGD) (N/A) as a surgical intervention .  The patient's history has been reviewed, patient examined, no change in status, stable for surgery.  I have reviewed the patient's chart and labs.  Questions were answered to the patient's satisfaction.     Natayah Warmack C

## 2015-09-24 NOTE — Op Note (Signed)
[facility] [place], [id number]   ENDOSCOPY PROCEDURE REPORT  PATIENT: [name], [name]  MR#: [id number] BIRTHDATE: 04/23/1946 , 43  yrs. old GENDER: male ENDOSCOPIST: Teena Irani, MD REFERRED BY: PROCEDURE DATE:  2015-10-23 PROCEDURE: ASA CLASS: INDICATIONS:  ood impaction MEDICATIONS: general anesthesia TOPICAL ANESTHETIC:  DESCRIPTION OF PROCEDURE: After the risks benefits and alternatives of the procedure were thoroughly explained, informed consent was obtained.  The [id number] endoscope was introduced through the mouth and advanced to the second portion of the duodenum , Without limitations.  The instrument was slowly withdrawn as the mucosa was fully examined. Estimated blood loss is zero unless otherwise noted in this procedure report.    sophagus initially filled with fluid and food debris.Distal stricture inflamed at 28 cm corresponding tosurgical anastomosis from esophagectomy and gastric pull-up. Multiple pieces of food were pushed into the stomach. There was suture material present just below the anastomosis.There was friability at the anastomosis afterfood was pushed through. Food fragments were seen in the stomach after this scope was passedbeyond the anastomosis. There was no visible evidence of tumor.The duodenum was normal The scope was then withdrawn from the patient and the procedure completed.  COMPLICATIONS: There were no immediate complications.  ENDOSCOPIC IMPRESSION: esophageal obstruction from ingested food at theesophagectomy anastomosis., No visible evidence of recurrent tumor  RECOMMENDATIONS: clear liquid diet and advance to soft mechanical diet.  REPEAT EXAM:  eSigned:  Teena Irani, MD 2015/10/23 10:20 AM    CC:  CPT CODES: ICD CODES:  The ICD and CPT codes recommended by this software are interpretations from the data that the clinical staff has captured with the software.  The  verification of the translation of this report to the ICD and CPT codes and modifiers is the sole responsibility of the health care institution and practicing physician where this report was generated.  [employer]. will not be held responsible for the validity of the ICD and CPT codes included on this report.  AMA assumes no liability for data contained or not contained herein. CPT is a Designer, television/film set of the [employer].

## 2015-09-24 NOTE — Anesthesia Procedure Notes (Signed)
Procedure Name: Intubation Date/Time: 09/24/2015 10:00 AM Performed by: Mia Winthrop, Virgel Gess Pre-anesthesia Checklist: Patient identified, Emergency Drugs available, Suction available, Patient being monitored and Timeout performed Patient Re-evaluated:Patient Re-evaluated prior to inductionOxygen Delivery Method: Circle system utilized Preoxygenation: Pre-oxygenation with 100% oxygen Intubation Type: IV induction Ventilation: Mask ventilation without difficulty Laryngoscope Size: Mac and 4 Grade View: Grade II Tube type: Oral Tube size: 6.5 mm Number of attempts: 1 Airway Equipment and Method: Stylet Placement Confirmation: ETT inserted through vocal cords under direct vision,  positive ETCO2,  CO2 detector and breath sounds checked- equal and bilateral Secured at: 23 cm Tube secured with: Tape Dental Injury: Teeth and Oropharynx as per pre-operative assessment

## 2015-09-24 NOTE — Anesthesia Preprocedure Evaluation (Addendum)
Anesthesia Evaluation  Patient identified by MRN, date of birth, ID band Patient awake    Reviewed: Allergy & Precautions, NPO status , Patient's Chart, lab work & pertinent test results  Airway Mallampati: II  TM Distance: >3 FB Neck ROM: Full   Comment: S/P esophageal resection for cancer.  S/P vocal cord implant at [facility] for hoarseness. Dental no notable dental hx.    Pulmonary former smoker,    Pulmonary exam normal breath sounds clear to auscultation       Cardiovascular Exercise Tolerance: Good Normal cardiovascular exam+ dysrhythmias  Rhythm:Regular Rate:Normal  No known heart disease.   Neuro/Psych  Neuromuscular disease negative psych ROS   GI/Hepatic Neg liver ROS, GERD  Medicated,Nitrostat for esophageal spasm only.   Endo/Other  negative endocrine ROS  Renal/GU negative Renal ROS  negative genitourinary   Musculoskeletal negative musculoskeletal ROS (+)   Abdominal   Peds negative pediatric ROS (+)  Hematology negative hematology ROS (+)   Anesthesia Other Findings   Reproductive/Obstetrics negative OB ROS                            Anesthesia Physical Anesthesia Plan  ASA: II  Anesthesia Plan: General   Post-op Pain Management:    Induction: Intravenous  Airway Management Planned: Oral ETT  Additional Equipment:   Intra-op Plan:   Post-operative Plan: Extubation in OR  Informed Consent: I have reviewed the patients History and Physical, chart, labs and discussed the procedure including the risks, benefits and alternatives for the proposed anesthesia with the patient or authorized representative who has indicated his/her understanding and acceptance.   Dental advisory given  Plan Discussed with: CRNA  Anesthesia Plan Comments:         Anesthesia Quick Evaluation

## 2015-09-25 ENCOUNTER — Encounter (HOSPITAL_COMMUNITY): Payer: Self-pay | Admitting: Gastroenterology

## 2015-11-13 DIAGNOSIS — R7309 Other abnormal glucose: Secondary | ICD-10-CM | POA: Diagnosis not present

## 2015-11-18 DIAGNOSIS — G479 Sleep disorder, unspecified: Secondary | ICD-10-CM | POA: Diagnosis not present

## 2015-11-18 DIAGNOSIS — C61 Malignant neoplasm of prostate: Secondary | ICD-10-CM | POA: Diagnosis not present

## 2015-11-18 DIAGNOSIS — R252 Cramp and spasm: Secondary | ICD-10-CM | POA: Diagnosis not present

## 2015-12-12 ENCOUNTER — Encounter (HOSPITAL_COMMUNITY)
Admission: RE | Disposition: A | Discharge status: Home / Self Care | Payer: Self-pay | Source: Ambulatory Visit | Attending: Gastroenterology

## 2015-12-12 ENCOUNTER — Ambulatory Visit (HOSPITAL_COMMUNITY)
Admission: RE | Admit: 2015-12-12 | Discharge: 2015-12-12 | Disposition: A | Discharge status: Home / Self Care | Payer: Commercial Managed Care - HMO | Source: Ambulatory Visit | Attending: Gastroenterology | Admitting: Gastroenterology

## 2015-12-12 ENCOUNTER — Ambulatory Visit (HOSPITAL_COMMUNITY): Payer: Commercial Managed Care - HMO | Admitting: Anesthesiology

## 2015-12-12 ENCOUNTER — Encounter (HOSPITAL_COMMUNITY): Payer: Self-pay | Admitting: Certified Registered"

## 2015-12-12 DIAGNOSIS — R1310 Dysphagia, unspecified: Secondary | ICD-10-CM | POA: Diagnosis not present

## 2015-12-12 DIAGNOSIS — Z87891 Personal history of nicotine dependence: Secondary | ICD-10-CM | POA: Diagnosis not present

## 2015-12-12 DIAGNOSIS — T18108A Unspecified foreign body in esophagus causing other injury, initial encounter: Secondary | ICD-10-CM | POA: Insufficient documentation

## 2015-12-12 DIAGNOSIS — K222 Esophageal obstruction: Secondary | ICD-10-CM | POA: Diagnosis not present

## 2015-12-12 DIAGNOSIS — X58XXXA Exposure to other specified factors, initial encounter: Secondary | ICD-10-CM | POA: Insufficient documentation

## 2015-12-12 DIAGNOSIS — T18128A Food in esophagus causing other injury, initial encounter: Secondary | ICD-10-CM | POA: Diagnosis not present

## 2015-12-12 DIAGNOSIS — T18120A Food in esophagus causing compression of trachea, initial encounter: Secondary | ICD-10-CM | POA: Diagnosis not present

## 2015-12-12 HISTORY — PX: ESOPHAGOGASTRODUODENOSCOPY: SHX5428

## 2015-12-12 SURGERY — EGD (ESOPHAGOGASTRODUODENOSCOPY)
Anesthesia: General

## 2015-12-12 MED ORDER — LIDOCAINE HCL (CARDIAC) 20 MG/ML IV SOLN
INTRAVENOUS | Status: AC
  Filled 2015-12-12: qty 5

## 2015-12-12 MED ORDER — LACTATED RINGERS IV SOLN
INTRAVENOUS | Status: DC | PRN
  Administered 2015-12-12: 07:00:00 via INTRAVENOUS

## 2015-12-12 MED ORDER — FENTANYL CITRATE (PF) 100 MCG/2ML IJ SOLN
INTRAMUSCULAR | Status: AC
  Filled 2015-12-12: qty 2

## 2015-12-12 MED ORDER — ONDANSETRON HCL 4 MG/2ML IJ SOLN
INTRAMUSCULAR | Status: DC | PRN
  Administered 2015-12-12: 4 mg via INTRAVENOUS

## 2015-12-12 MED ORDER — FENTANYL CITRATE (PF) 100 MCG/2ML IJ SOLN
INTRAMUSCULAR | Status: DC | PRN
  Administered 2015-12-12 (×2): 50 ug via INTRAVENOUS

## 2015-12-12 MED ORDER — SUCCINYLCHOLINE CHLORIDE 20 MG/ML IJ SOLN
INTRAMUSCULAR | Status: DC | PRN
  Administered 2015-12-12: 100 mg via INTRAVENOUS

## 2015-12-12 MED ORDER — LIDOCAINE HCL (CARDIAC) 20 MG/ML IV SOLN
INTRAVENOUS | Status: DC | PRN
  Administered 2015-12-12: 50 mg via INTRAVENOUS

## 2015-12-12 MED ORDER — PROPOFOL 10 MG/ML IV BOLUS
INTRAVENOUS | Status: AC
  Filled 2015-12-12: qty 20

## 2015-12-12 MED ORDER — PROPOFOL 10 MG/ML IV BOLUS
INTRAVENOUS | Status: DC | PRN
  Administered 2015-12-12: 160 mg via INTRAVENOUS

## 2015-12-12 NOTE — Discharge Instructions (Signed)
Call if question or problem otherwise no driving or sharp objects today and clear liquids only for 4 hours and then soft solids if doing well and chew your food well with plenty of fluids and eat slowly and call Dr. Amedeo Plenty to set up a dilation in the next week or 2 Esophagogastroduodenoscopy, Care After Refer to this sheet in the next few weeks. These instructions provide you with information about caring for yourself after your procedure. Your health care provider may also give you more specific instructions. Your treatment has been planned according to current medical practices, but problems sometimes occur. Call your health care provider if you have any problems or questions after your procedure. WHAT TO EXPECT AFTER THE PROCEDURE After your procedure, it is typical to feel:  Soreness in your throat.  Pain with swallowing.  Sick to your stomach (nauseous).  Bloated.  Dizzy.  Fatigued. HOME CARE INSTRUCTIONS  Do not eat or drink anything until the numbing medicine (local anesthetic) has worn off and your gag reflex has returned. You will know that the local anesthetic has worn off when you can swallow comfortably.  Do not drive or operate machinery until directed by your health care provider.  Take medicines only as directed by your health care provider. SEEK MEDICAL CARE IF:   You cannot stop coughing.  You are not urinating at all or less than usual. SEEK IMMEDIATE MEDICAL CARE IF:  You have difficulty swallowing.  You cannot eat or drink.  You have worsening throat or chest pain.  You have dizziness or lightheadedness or you faint.  You have nausea or vomiting.  You have chills.  You have a fever.  You have severe abdominal pain.  You have black, tarry, or bloody stools.   This information is not intended to replace advice given to you by your health care provider. Make sure you discuss any questions you have with your health care provider.   Document Released:  08/24/2012 Document Revised: 09/28/2014 Document Reviewed: 08/24/2012 Elsevier Interactive Patient Education [employer].

## 2015-12-12 NOTE — Progress Notes (Signed)
[name] 7:37 AM  Subjective: Patient seen and examined and his hospital computer chart was reviewed and his case was discussed extensively last night on the phone with the patient's wife and he has had chemoradiation and resection for esophageal cancer and a food impaction in January and 5 other episodes of food impactions that he was able to get up on his own however he feels like the food is still caught and he is aware he needs a dilation but has not had that yet and has not had any other medical issues since last seen by my partner Dr. Amedeo Plenty in January  Objective: Vital signs stable afebrile no acute distress exam please see preassessment evaluation  Assessment: Food impaction  Plan: Okay to proceed with endoscopy with anesthesia assistance in effort to remove the food and then will follow-up in a week or 2 with Dr. Amedeo Plenty for a dilation and the risks of the procedure was discussed with the patient and his wife  [facility]  Pager [phone] After 5PM or if no answer call [phone]

## 2015-12-12 NOTE — Transfer of Care (Signed)
Immediate Anesthesia Transfer of Care Note  Patient: [name]  Procedure(s) Performed: Procedure(s): ESOPHAGOGASTRODUODENOSCOPY (EGD) (N/A)  Patient Location: PACU  Anesthesia Type:General  Level of Consciousness: awake, alert  and oriented  Airway & Oxygen Therapy: Patient Spontanous Breathing and Patient connected to face mask oxygen  Post-op Assessment: Report given to RN and Post -op Vital signs reviewed and stable  Post vital signs: Reviewed and stable  Last Vitals:  Filed Vitals:   12/12/15 0653  BP: 112/62  Pulse: 79  Temp: 36.7 C  Resp: 6    Complications: No apparent anesthesia complications

## 2015-12-12 NOTE — Anesthesia Procedure Notes (Signed)
Procedure Name: Intubation Date/Time: 12/12/2015 7:50 AM Performed by: Noralyn Pick D Pre-anesthesia Checklist: Patient identified, Emergency Drugs available, Suction available and Patient being monitored Patient Re-evaluated:Patient Re-evaluated prior to inductionOxygen Delivery Method: Circle System Utilized Preoxygenation: Pre-oxygenation with 100% oxygen Intubation Type: IV induction, Rapid sequence and Cricoid Pressure applied Laryngoscope Size: Mac and 4 Grade View: Grade I Tube type: Oral Tube size: 7.0 mm Number of attempts: 1 Airway Equipment and Method: Stylet and Oral airway Placement Confirmation: ETT inserted through vocal cords under direct vision,  positive ETCO2 and breath sounds checked- equal and bilateral Secured at: 21 cm Tube secured with: Tape Dental Injury: Teeth and Oropharynx as per pre-operative assessment

## 2015-12-12 NOTE — Anesthesia Postprocedure Evaluation (Signed)
Anesthesia Post Note  Patient: [name]  Procedure(s) Performed: Procedure(s) (LRB): ESOPHAGOGASTRODUODENOSCOPY (EGD) (N/A)  Patient location during evaluation: Endoscopy Anesthesia Type: General Level of consciousness: awake and alert Pain management: pain level controlled Vital Signs Assessment: post-procedure vital signs reviewed and stable Respiratory status: spontaneous breathing, nonlabored ventilation, respiratory function stable and patient connected to nasal cannula oxygen Cardiovascular status: blood pressure returned to baseline and stable Postop Assessment: no signs of nausea or vomiting Anesthetic complications: no    Last Vitals:  Filed Vitals:   12/12/15 0850 12/12/15 0900  BP: 126/64 124/72  Pulse: 75 71  Temp:    Resp: 16 11    Last Pain: There were no vitals filed for this visit.               Alexis Frock

## 2015-12-12 NOTE — Op Note (Signed)
[facility] Patient Name: [name] Procedure Date: 12/12/2015 MRN: [id number] Attending MD: Clarene Essex , MD Date of Birth: Apr 04, 1946 CSN:  Age: 70 Admit Type: Outpatient Procedure:                Upper GI endoscopy Indications:              Foreign body in the esophagus Providers:                Clarene Essex, MD, Carolynn Comment, RN, Alfonso Patten,                            Technician, Karis Juba, CRNA Referring MD:              Medicines:                General Anesthesia Complications:            No immediate complications. Estimated Blood Loss:     Estimated blood loss: none. Procedure:                Pre-Anesthesia Assessment:                           - Prior to the procedure, a History and Physical                            was performed, and patient medications and                            allergies were reviewed. The patient's tolerance of                            previous anesthesia was also reviewed. The risks                            and benefits of the procedure and the sedation                            options and risks were discussed with the patient.                            All questions were answered, and informed consent                            was obtained. Prior Anticoagulants: The patient has                            taken no previous anticoagulant or antiplatelet                            agents. ASA Grade Assessment: II - A patient with                            mild systemic disease. After reviewing the risks  and benefits, the patient was deemed in                            satisfactory condition to undergo the procedure.                           After obtaining informed consent, the endoscope was                            passed under direct vision. Throughout the                            procedure, the patient's blood pressure, pulse, and                            oxygen  saturations were monitored continuously. The                            Endoscope was introduced through the mouth, and                            advanced to the second part of duodenum. The                            [id number]) scope was introduced through the                            and advanced to the. The upper GI endoscopy was                            accomplished without difficulty. The patient                            tolerated the procedure well. Scope In: Scope Out: Findings:      Food was found in the upper third of the esophagus. Removal of food was       accomplishedUsing the Roth net -3.      One moderate benign-appearing, intrinsic stenosis was found. And was       traversed after downsizing scope to the pediatric endoscope without any       resistance      The entire examined stomach was normal except for bile reflux gastritis.      The duodenal bulb, first portion of the duodenum and second portion of       the duodenum were normal.      The exam was otherwise without abnormality.      Evidence of an eso-gast anast were found in the anastomosis. Impression:               - Food in the upper third of the esophagus. Removal                            was successful.                           - Benign-appearing esophageal  stenosis.                           - Normal stomachexcept bile reflux gastritis.                           - Normal duodenal bulb, first portion of the                            duodenum and second portion of the duodenum.                           - The examination was otherwise normal.                           - An eso-gast anast were found. Moderate Sedation:      moderate sedation-none Recommendation:           - Clear liquid diet for 4 hours.                           - Continue present medications.                           - Return to GI clinic PRN.                           - Telephone GI clinic if symptomatic PRN.                            - Perform an upper GI endoscopywith dilation at                            appointment to be scheduled. Procedure Code(s):        --- Professional ---                           [phone], Esophagogastroduodenoscopy, flexible,                            transoral; with removal of foreign body(s) Diagnosis Code(s):        --- Professional ---                           [id number], Food in esophagus causing other injury,                            initial encounter                           K22.2, Esophageal obstruction                           T18.108A, Unspecified foreign body in esophagus                            causing other injury, initial encounter CPT copyright 2016 American Medical Association.  All rights reserved. The codes documented in this report are preliminary and upon coder review may  be revised to meet current compliance requirements. Clarene Essex, MD Clarene Essex, MD 12/12/2015 8:47:08 AM This report has been signed electronically. Number of Addenda: 0

## 2015-12-12 NOTE — Anesthesia Preprocedure Evaluation (Addendum)
Anesthesia Evaluation  Patient identified by MRN, date of birth, ID band Patient awake    Reviewed: Allergy & Precautions, NPO status , Patient's Chart, lab work & pertinent test results  Airway Mallampati: II   Neck ROM: Full    Dental  (+) Teeth Intact, Dental Advisory Given   Pulmonary former smoker,    breath sounds clear to auscultation       Cardiovascular negative cardio ROS   Rhythm:Regular     Neuro/Psych    GI/Hepatic Neg liver ROS, GERD  ,Esophageal stricture, SP resection for CA   Endo/Other  negative endocrine ROS  Renal/GU negative Renal ROS     Musculoskeletal negative musculoskeletal ROS (+)   Abdominal   Peds  Hematology negative hematology ROS (+)   Anesthesia Other Findings Vocal cord paralysis SP esophageal surgery  Reproductive/Obstetrics                            Anesthesia Physical Anesthesia Plan  ASA: II and emergent  Anesthesia Plan: General   Post-op Pain Management:    Induction: Intravenous, Rapid sequence and Cricoid pressure planned  Airway Management Planned: Oral ETT  Additional Equipment:   Intra-op Plan:   Post-operative Plan: Extubation in OR  Informed Consent: I have reviewed the patients History and Physical, chart, labs and discussed the procedure including the risks, benefits and alternatives for the proposed anesthesia with the patient or authorized representative who has indicated his/her understanding and acceptance.     Plan Discussed with:   Anesthesia Plan Comments: (7 ETT)        Anesthesia Quick Evaluation

## 2015-12-16 DIAGNOSIS — Z86010 Personal history of colonic polyps: Secondary | ICD-10-CM | POA: Diagnosis not present

## 2015-12-16 DIAGNOSIS — H2513 Age-related nuclear cataract, bilateral: Secondary | ICD-10-CM | POA: Diagnosis not present

## 2015-12-16 DIAGNOSIS — R1314 Dysphagia, pharyngoesophageal phase: Secondary | ICD-10-CM | POA: Diagnosis not present

## 2015-12-18 DIAGNOSIS — H25011 Cortical age-related cataract, right eye: Secondary | ICD-10-CM | POA: Diagnosis not present

## 2015-12-18 DIAGNOSIS — H2511 Age-related nuclear cataract, right eye: Secondary | ICD-10-CM | POA: Diagnosis not present

## 2015-12-24 DIAGNOSIS — K5730 Diverticulosis of large intestine without perforation or abscess without bleeding: Secondary | ICD-10-CM | POA: Diagnosis not present

## 2015-12-24 DIAGNOSIS — Z9889 Other specified postprocedural states: Secondary | ICD-10-CM | POA: Diagnosis not present

## 2015-12-24 DIAGNOSIS — Z86010 Personal history of colonic polyps: Secondary | ICD-10-CM | POA: Diagnosis not present

## 2015-12-24 DIAGNOSIS — K222 Esophageal obstruction: Secondary | ICD-10-CM | POA: Diagnosis not present

## 2015-12-24 DIAGNOSIS — R1310 Dysphagia, unspecified: Secondary | ICD-10-CM | POA: Diagnosis not present

## 2016-01-22 DIAGNOSIS — Z86010 Personal history of colonic polyps: Secondary | ICD-10-CM | POA: Diagnosis not present

## 2016-01-22 DIAGNOSIS — R1314 Dysphagia, pharyngoesophageal phase: Secondary | ICD-10-CM | POA: Diagnosis not present

## 2016-01-22 DIAGNOSIS — C153 Malignant neoplasm of upper third of esophagus: Secondary | ICD-10-CM | POA: Diagnosis not present

## 2016-02-05 DIAGNOSIS — H2512 Age-related nuclear cataract, left eye: Secondary | ICD-10-CM | POA: Diagnosis not present

## 2016-02-05 DIAGNOSIS — H5703 Miosis: Secondary | ICD-10-CM | POA: Diagnosis not present

## 2016-02-05 DIAGNOSIS — H25011 Cortical age-related cataract, right eye: Secondary | ICD-10-CM | POA: Diagnosis not present

## 2016-02-05 DIAGNOSIS — R69 Illness, unspecified: Secondary | ICD-10-CM | POA: Diagnosis not present

## 2016-02-05 DIAGNOSIS — H2511 Age-related nuclear cataract, right eye: Secondary | ICD-10-CM | POA: Diagnosis not present

## 2016-02-05 DIAGNOSIS — H25012 Cortical age-related cataract, left eye: Secondary | ICD-10-CM | POA: Diagnosis not present

## 2016-02-13 DIAGNOSIS — L820 Inflamed seborrheic keratosis: Secondary | ICD-10-CM | POA: Diagnosis not present

## 2016-02-13 DIAGNOSIS — D225 Melanocytic nevi of trunk: Secondary | ICD-10-CM | POA: Diagnosis not present

## 2016-02-13 DIAGNOSIS — L814 Other melanin hyperpigmentation: Secondary | ICD-10-CM | POA: Diagnosis not present

## 2016-02-13 DIAGNOSIS — L570 Actinic keratosis: Secondary | ICD-10-CM | POA: Diagnosis not present

## 2016-02-19 DIAGNOSIS — H25012 Cortical age-related cataract, left eye: Secondary | ICD-10-CM | POA: Diagnosis not present

## 2016-02-19 DIAGNOSIS — H2512 Age-related nuclear cataract, left eye: Secondary | ICD-10-CM | POA: Diagnosis not present

## 2016-02-27 DIAGNOSIS — K222 Esophageal obstruction: Secondary | ICD-10-CM | POA: Diagnosis not present

## 2016-03-10 DIAGNOSIS — K222 Esophageal obstruction: Secondary | ICD-10-CM | POA: Diagnosis not present

## 2016-03-10 DIAGNOSIS — Z9889 Other specified postprocedural states: Secondary | ICD-10-CM | POA: Diagnosis not present

## 2016-04-24 DIAGNOSIS — C61 Malignant neoplasm of prostate: Secondary | ICD-10-CM | POA: Diagnosis not present

## 2016-04-28 DIAGNOSIS — K222 Esophageal obstruction: Secondary | ICD-10-CM | POA: Diagnosis not present

## 2016-04-28 DIAGNOSIS — K229 Disease of esophagus, unspecified: Secondary | ICD-10-CM | POA: Diagnosis not present

## 2016-04-28 DIAGNOSIS — K9189 Other postprocedural complications and disorders of digestive system: Secondary | ICD-10-CM | POA: Diagnosis not present

## 2016-05-13 DIAGNOSIS — J3801 Paralysis of vocal cords and larynx, unilateral: Secondary | ICD-10-CM | POA: Diagnosis not present

## 2016-05-13 DIAGNOSIS — R1310 Dysphagia, unspecified: Secondary | ICD-10-CM | POA: Diagnosis not present

## 2016-05-13 DIAGNOSIS — R1314 Dysphagia, pharyngoesophageal phase: Secondary | ICD-10-CM | POA: Diagnosis not present

## 2016-05-13 DIAGNOSIS — R499 Unspecified voice and resonance disorder: Secondary | ICD-10-CM | POA: Diagnosis not present

## 2016-05-27 DIAGNOSIS — R1310 Dysphagia, unspecified: Secondary | ICD-10-CM | POA: Diagnosis not present

## 2016-05-27 DIAGNOSIS — Z9889 Other specified postprocedural states: Secondary | ICD-10-CM | POA: Diagnosis not present

## 2016-06-09 DIAGNOSIS — K30 Functional dyspepsia: Secondary | ICD-10-CM | POA: Diagnosis not present

## 2016-06-09 DIAGNOSIS — K219 Gastro-esophageal reflux disease without esophagitis: Secondary | ICD-10-CM | POA: Diagnosis not present

## 2016-06-09 DIAGNOSIS — Z8501 Personal history of malignant neoplasm of esophagus: Secondary | ICD-10-CM | POA: Diagnosis not present

## 2016-06-09 DIAGNOSIS — R1314 Dysphagia, pharyngoesophageal phase: Secondary | ICD-10-CM | POA: Diagnosis not present

## 2016-06-09 DIAGNOSIS — R1310 Dysphagia, unspecified: Secondary | ICD-10-CM | POA: Diagnosis not present

## 2016-06-09 DIAGNOSIS — J3801 Paralysis of vocal cords and larynx, unilateral: Secondary | ICD-10-CM | POA: Diagnosis not present

## 2016-06-09 DIAGNOSIS — Z87891 Personal history of nicotine dependence: Secondary | ICD-10-CM | POA: Diagnosis not present

## 2016-06-09 DIAGNOSIS — Z79899 Other long term (current) drug therapy: Secondary | ICD-10-CM | POA: Diagnosis not present

## 2016-06-09 DIAGNOSIS — R1319 Other dysphagia: Secondary | ICD-10-CM | POA: Diagnosis not present

## 2016-06-09 DIAGNOSIS — K222 Esophageal obstruction: Secondary | ICD-10-CM | POA: Diagnosis not present

## 2016-06-15 DIAGNOSIS — K224 Dyskinesia of esophagus: Secondary | ICD-10-CM | POA: Diagnosis not present

## 2016-06-15 DIAGNOSIS — R109 Unspecified abdominal pain: Secondary | ICD-10-CM | POA: Diagnosis not present

## 2016-06-15 DIAGNOSIS — Z0000 Encounter for general adult medical examination without abnormal findings: Secondary | ICD-10-CM | POA: Diagnosis not present

## 2016-06-15 DIAGNOSIS — Z961 Presence of intraocular lens: Secondary | ICD-10-CM | POA: Diagnosis not present

## 2016-06-15 DIAGNOSIS — Z23 Encounter for immunization: Secondary | ICD-10-CM | POA: Diagnosis not present

## 2016-06-15 DIAGNOSIS — N529 Male erectile dysfunction, unspecified: Secondary | ICD-10-CM | POA: Diagnosis not present

## 2016-06-15 DIAGNOSIS — G479 Sleep disorder, unspecified: Secondary | ICD-10-CM | POA: Diagnosis not present

## 2016-07-08 DIAGNOSIS — J3801 Paralysis of vocal cords and larynx, unilateral: Secondary | ICD-10-CM | POA: Diagnosis not present

## 2016-07-08 DIAGNOSIS — Z9889 Other specified postprocedural states: Secondary | ICD-10-CM | POA: Diagnosis not present

## 2016-07-08 DIAGNOSIS — R1314 Dysphagia, pharyngoesophageal phase: Secondary | ICD-10-CM | POA: Diagnosis not present

## 2016-07-08 DIAGNOSIS — Z8501 Personal history of malignant neoplasm of esophagus: Secondary | ICD-10-CM | POA: Diagnosis not present

## 2016-07-08 DIAGNOSIS — K219 Gastro-esophageal reflux disease without esophagitis: Secondary | ICD-10-CM | POA: Diagnosis not present

## 2016-07-08 DIAGNOSIS — R1313 Dysphagia, pharyngeal phase: Secondary | ICD-10-CM | POA: Diagnosis not present

## 2016-07-08 DIAGNOSIS — K222 Esophageal obstruction: Secondary | ICD-10-CM | POA: Diagnosis not present

## 2016-07-08 DIAGNOSIS — Z923 Personal history of irradiation: Secondary | ICD-10-CM | POA: Diagnosis not present

## 2016-07-08 DIAGNOSIS — Z9221 Personal history of antineoplastic chemotherapy: Secondary | ICD-10-CM | POA: Diagnosis not present

## 2016-07-28 DIAGNOSIS — J3801 Paralysis of vocal cords and larynx, unilateral: Secondary | ICD-10-CM | POA: Diagnosis not present

## 2016-07-28 DIAGNOSIS — K222 Esophageal obstruction: Secondary | ICD-10-CM | POA: Diagnosis not present

## 2016-07-28 DIAGNOSIS — K30 Functional dyspepsia: Secondary | ICD-10-CM | POA: Diagnosis not present

## 2016-07-28 DIAGNOSIS — Z9841 Cataract extraction status, right eye: Secondary | ICD-10-CM | POA: Diagnosis not present

## 2016-07-28 DIAGNOSIS — Z79891 Long term (current) use of opiate analgesic: Secondary | ICD-10-CM | POA: Diagnosis not present

## 2016-07-28 DIAGNOSIS — Z87891 Personal history of nicotine dependence: Secondary | ICD-10-CM | POA: Diagnosis not present

## 2016-07-28 DIAGNOSIS — R1314 Dysphagia, pharyngoesophageal phase: Secondary | ICD-10-CM | POA: Diagnosis not present

## 2016-07-28 DIAGNOSIS — Z8501 Personal history of malignant neoplasm of esophagus: Secondary | ICD-10-CM | POA: Diagnosis not present

## 2016-07-28 DIAGNOSIS — Z79899 Other long term (current) drug therapy: Secondary | ICD-10-CM | POA: Diagnosis not present

## 2016-08-11 DIAGNOSIS — G2581 Restless legs syndrome: Secondary | ICD-10-CM | POA: Diagnosis not present

## 2016-09-22 DIAGNOSIS — Z8501 Personal history of malignant neoplasm of esophagus: Secondary | ICD-10-CM | POA: Diagnosis not present

## 2016-09-22 DIAGNOSIS — Z87891 Personal history of nicotine dependence: Secondary | ICD-10-CM | POA: Diagnosis not present

## 2016-09-22 DIAGNOSIS — J3801 Paralysis of vocal cords and larynx, unilateral: Secondary | ICD-10-CM | POA: Diagnosis not present

## 2016-09-22 DIAGNOSIS — K219 Gastro-esophageal reflux disease without esophagitis: Secondary | ICD-10-CM | POA: Diagnosis not present

## 2016-09-22 DIAGNOSIS — K30 Functional dyspepsia: Secondary | ICD-10-CM | POA: Diagnosis not present

## 2016-09-22 DIAGNOSIS — K222 Esophageal obstruction: Secondary | ICD-10-CM | POA: Diagnosis not present

## 2016-09-22 DIAGNOSIS — R1314 Dysphagia, pharyngoesophageal phase: Secondary | ICD-10-CM | POA: Diagnosis not present

## 2016-09-25 DIAGNOSIS — Z8501 Personal history of malignant neoplasm of esophagus: Secondary | ICD-10-CM | POA: Diagnosis not present

## 2016-09-25 DIAGNOSIS — H6121 Impacted cerumen, right ear: Secondary | ICD-10-CM | POA: Diagnosis not present

## 2016-09-25 DIAGNOSIS — Z87891 Personal history of nicotine dependence: Secondary | ICD-10-CM | POA: Diagnosis not present

## 2016-09-25 DIAGNOSIS — F1020 Alcohol dependence, uncomplicated: Secondary | ICD-10-CM | POA: Diagnosis not present

## 2016-09-25 DIAGNOSIS — Z8709 Personal history of other diseases of the respiratory system: Secondary | ICD-10-CM | POA: Diagnosis not present

## 2016-09-29 DIAGNOSIS — H903 Sensorineural hearing loss, bilateral: Secondary | ICD-10-CM | POA: Diagnosis not present

## 2016-10-21 DIAGNOSIS — C61 Malignant neoplasm of prostate: Secondary | ICD-10-CM | POA: Diagnosis not present

## 2016-10-28 DIAGNOSIS — N400 Enlarged prostate without lower urinary tract symptoms: Secondary | ICD-10-CM | POA: Diagnosis not present

## 2016-10-28 DIAGNOSIS — C61 Malignant neoplasm of prostate: Secondary | ICD-10-CM | POA: Diagnosis not present

## 2016-11-24 DIAGNOSIS — R030 Elevated blood-pressure reading, without diagnosis of hypertension: Secondary | ICD-10-CM | POA: Diagnosis not present

## 2016-11-24 DIAGNOSIS — I491 Atrial premature depolarization: Secondary | ICD-10-CM | POA: Diagnosis not present

## 2016-12-08 DIAGNOSIS — K222 Esophageal obstruction: Secondary | ICD-10-CM | POA: Diagnosis not present

## 2016-12-08 DIAGNOSIS — Z8501 Personal history of malignant neoplasm of esophagus: Secondary | ICD-10-CM | POA: Diagnosis not present

## 2016-12-08 DIAGNOSIS — Z87891 Personal history of nicotine dependence: Secondary | ICD-10-CM | POA: Diagnosis not present

## 2016-12-08 DIAGNOSIS — R1310 Dysphagia, unspecified: Secondary | ICD-10-CM | POA: Diagnosis not present

## 2016-12-08 DIAGNOSIS — J3801 Paralysis of vocal cords and larynx, unilateral: Secondary | ICD-10-CM | POA: Diagnosis not present

## 2016-12-16 DIAGNOSIS — G479 Sleep disorder, unspecified: Secondary | ICD-10-CM | POA: Diagnosis not present

## 2016-12-16 DIAGNOSIS — G2581 Restless legs syndrome: Secondary | ICD-10-CM | POA: Diagnosis not present

## 2016-12-16 DIAGNOSIS — M25569 Pain in unspecified knee: Secondary | ICD-10-CM | POA: Diagnosis not present

## 2016-12-16 DIAGNOSIS — R109 Unspecified abdominal pain: Secondary | ICD-10-CM | POA: Diagnosis not present

## 2016-12-28 DIAGNOSIS — D225 Melanocytic nevi of trunk: Secondary | ICD-10-CM | POA: Diagnosis not present

## 2016-12-28 DIAGNOSIS — D1801 Hemangioma of skin and subcutaneous tissue: Secondary | ICD-10-CM | POA: Diagnosis not present

## 2016-12-28 DIAGNOSIS — L821 Other seborrheic keratosis: Secondary | ICD-10-CM | POA: Diagnosis not present

## 2016-12-28 DIAGNOSIS — L814 Other melanin hyperpigmentation: Secondary | ICD-10-CM | POA: Diagnosis not present

## 2016-12-30 DIAGNOSIS — Z48813 Encounter for surgical aftercare following surgery on the respiratory system: Secondary | ICD-10-CM | POA: Diagnosis not present

## 2016-12-30 DIAGNOSIS — K222 Esophageal obstruction: Secondary | ICD-10-CM | POA: Diagnosis not present

## 2016-12-30 DIAGNOSIS — R1314 Dysphagia, pharyngoesophageal phase: Secondary | ICD-10-CM | POA: Diagnosis not present

## 2017-01-28 DIAGNOSIS — J3801 Paralysis of vocal cords and larynx, unilateral: Secondary | ICD-10-CM | POA: Diagnosis not present

## 2017-01-28 DIAGNOSIS — Z791 Long term (current) use of non-steroidal anti-inflammatories (NSAID): Secondary | ICD-10-CM | POA: Diagnosis not present

## 2017-01-28 DIAGNOSIS — Z859 Personal history of malignant neoplasm, unspecified: Secondary | ICD-10-CM | POA: Diagnosis not present

## 2017-01-28 DIAGNOSIS — Z79899 Other long term (current) drug therapy: Secondary | ICD-10-CM | POA: Diagnosis not present

## 2017-01-28 DIAGNOSIS — R1310 Dysphagia, unspecified: Secondary | ICD-10-CM | POA: Diagnosis not present

## 2017-01-28 DIAGNOSIS — K222 Esophageal obstruction: Secondary | ICD-10-CM | POA: Diagnosis not present

## 2017-01-28 DIAGNOSIS — Z87891 Personal history of nicotine dependence: Secondary | ICD-10-CM | POA: Diagnosis not present

## 2017-01-28 DIAGNOSIS — Z79891 Long term (current) use of opiate analgesic: Secondary | ICD-10-CM | POA: Diagnosis not present

## 2017-01-28 DIAGNOSIS — H9190 Unspecified hearing loss, unspecified ear: Secondary | ICD-10-CM | POA: Diagnosis not present

## 2017-01-28 DIAGNOSIS — K219 Gastro-esophageal reflux disease without esophagitis: Secondary | ICD-10-CM | POA: Diagnosis not present

## 2017-03-08 DIAGNOSIS — M542 Cervicalgia: Secondary | ICD-10-CM | POA: Diagnosis not present

## 2017-03-08 DIAGNOSIS — M62838 Other muscle spasm: Secondary | ICD-10-CM | POA: Diagnosis not present

## 2017-03-15 DIAGNOSIS — Z9049 Acquired absence of other specified parts of digestive tract: Secondary | ICD-10-CM | POA: Diagnosis not present

## 2017-03-15 DIAGNOSIS — Z48815 Encounter for surgical aftercare following surgery on the digestive system: Secondary | ICD-10-CM | POA: Diagnosis not present

## 2017-03-15 DIAGNOSIS — R1310 Dysphagia, unspecified: Secondary | ICD-10-CM | POA: Diagnosis not present

## 2017-03-15 DIAGNOSIS — K222 Esophageal obstruction: Secondary | ICD-10-CM | POA: Diagnosis not present

## 2017-03-15 DIAGNOSIS — Z923 Personal history of irradiation: Secondary | ICD-10-CM | POA: Diagnosis not present

## 2017-03-15 DIAGNOSIS — Z8501 Personal history of malignant neoplasm of esophagus: Secondary | ICD-10-CM | POA: Diagnosis not present

## 2017-03-15 DIAGNOSIS — Z9221 Personal history of antineoplastic chemotherapy: Secondary | ICD-10-CM | POA: Diagnosis not present

## 2017-03-16 ENCOUNTER — Other Ambulatory Visit: Payer: Self-pay | Admitting: Family Medicine

## 2017-03-16 ENCOUNTER — Ambulatory Visit
Admission: RE | Admit: 2017-03-16 | Discharge: 2017-03-16 | Disposition: A | Discharge status: Home / Self Care | Payer: Medicare HMO | Source: Ambulatory Visit | Attending: Family Medicine | Admitting: Family Medicine

## 2017-03-16 DIAGNOSIS — M542 Cervicalgia: Secondary | ICD-10-CM

## 2017-03-16 DIAGNOSIS — M47812 Spondylosis without myelopathy or radiculopathy, cervical region: Secondary | ICD-10-CM | POA: Diagnosis not present

## 2017-03-22 DIAGNOSIS — H903 Sensorineural hearing loss, bilateral: Secondary | ICD-10-CM | POA: Diagnosis not present

## 2017-03-23 DIAGNOSIS — G2581 Restless legs syndrome: Secondary | ICD-10-CM | POA: Diagnosis not present

## 2017-03-23 DIAGNOSIS — M542 Cervicalgia: Secondary | ICD-10-CM | POA: Diagnosis not present

## 2017-04-21 DIAGNOSIS — C61 Malignant neoplasm of prostate: Secondary | ICD-10-CM | POA: Diagnosis not present

## 2017-04-28 DIAGNOSIS — N400 Enlarged prostate without lower urinary tract symptoms: Secondary | ICD-10-CM | POA: Diagnosis not present

## 2017-04-28 DIAGNOSIS — C61 Malignant neoplasm of prostate: Secondary | ICD-10-CM | POA: Diagnosis not present

## 2017-05-06 DIAGNOSIS — F418 Other specified anxiety disorders: Secondary | ICD-10-CM | POA: Diagnosis not present

## 2017-05-17 DIAGNOSIS — F411 Generalized anxiety disorder: Secondary | ICD-10-CM | POA: Diagnosis not present

## 2017-06-16 DIAGNOSIS — N5201 Erectile dysfunction due to arterial insufficiency: Secondary | ICD-10-CM | POA: Diagnosis not present

## 2017-06-16 DIAGNOSIS — Z8501 Personal history of malignant neoplasm of esophagus: Secondary | ICD-10-CM | POA: Diagnosis not present

## 2017-06-16 DIAGNOSIS — M179 Osteoarthritis of knee, unspecified: Secondary | ICD-10-CM | POA: Diagnosis not present

## 2017-06-16 DIAGNOSIS — K219 Gastro-esophageal reflux disease without esophagitis: Secondary | ICD-10-CM | POA: Diagnosis not present

## 2017-06-16 DIAGNOSIS — G479 Sleep disorder, unspecified: Secondary | ICD-10-CM | POA: Diagnosis not present

## 2017-06-16 DIAGNOSIS — C61 Malignant neoplasm of prostate: Secondary | ICD-10-CM | POA: Diagnosis not present

## 2017-06-16 DIAGNOSIS — F418 Other specified anxiety disorders: Secondary | ICD-10-CM | POA: Diagnosis not present

## 2017-06-16 DIAGNOSIS — G2581 Restless legs syndrome: Secondary | ICD-10-CM | POA: Diagnosis not present

## 2017-06-16 DIAGNOSIS — Z0000 Encounter for general adult medical examination without abnormal findings: Secondary | ICD-10-CM | POA: Diagnosis not present

## 2017-06-16 DIAGNOSIS — Z23 Encounter for immunization: Secondary | ICD-10-CM | POA: Diagnosis not present

## 2017-06-21 DIAGNOSIS — H524 Presbyopia: Secondary | ICD-10-CM | POA: Diagnosis not present

## 2017-07-29 DIAGNOSIS — Z961 Presence of intraocular lens: Secondary | ICD-10-CM | POA: Diagnosis not present

## 2017-07-29 DIAGNOSIS — H43812 Vitreous degeneration, left eye: Secondary | ICD-10-CM | POA: Diagnosis not present

## 2017-08-23 DIAGNOSIS — M4004 Postural kyphosis, thoracic region: Secondary | ICD-10-CM | POA: Diagnosis not present

## 2017-08-23 DIAGNOSIS — M9903 Segmental and somatic dysfunction of lumbar region: Secondary | ICD-10-CM | POA: Diagnosis not present

## 2017-08-23 DIAGNOSIS — M9902 Segmental and somatic dysfunction of thoracic region: Secondary | ICD-10-CM | POA: Diagnosis not present

## 2017-08-23 DIAGNOSIS — M9901 Segmental and somatic dysfunction of cervical region: Secondary | ICD-10-CM | POA: Diagnosis not present

## 2017-08-23 DIAGNOSIS — M4003 Postural kyphosis, cervicothoracic region: Secondary | ICD-10-CM | POA: Diagnosis not present

## 2017-08-23 DIAGNOSIS — M5137 Other intervertebral disc degeneration, lumbosacral region: Secondary | ICD-10-CM | POA: Diagnosis not present

## 2017-08-26 DIAGNOSIS — M9902 Segmental and somatic dysfunction of thoracic region: Secondary | ICD-10-CM | POA: Diagnosis not present

## 2017-08-26 DIAGNOSIS — M9903 Segmental and somatic dysfunction of lumbar region: Secondary | ICD-10-CM | POA: Diagnosis not present

## 2017-08-26 DIAGNOSIS — M9901 Segmental and somatic dysfunction of cervical region: Secondary | ICD-10-CM | POA: Diagnosis not present

## 2017-08-26 DIAGNOSIS — M4003 Postural kyphosis, cervicothoracic region: Secondary | ICD-10-CM | POA: Diagnosis not present

## 2017-08-26 DIAGNOSIS — M4004 Postural kyphosis, thoracic region: Secondary | ICD-10-CM | POA: Diagnosis not present

## 2017-08-26 DIAGNOSIS — M5137 Other intervertebral disc degeneration, lumbosacral region: Secondary | ICD-10-CM | POA: Diagnosis not present

## 2017-08-31 DIAGNOSIS — M4003 Postural kyphosis, cervicothoracic region: Secondary | ICD-10-CM | POA: Diagnosis not present

## 2017-08-31 DIAGNOSIS — M9902 Segmental and somatic dysfunction of thoracic region: Secondary | ICD-10-CM | POA: Diagnosis not present

## 2017-08-31 DIAGNOSIS — M9901 Segmental and somatic dysfunction of cervical region: Secondary | ICD-10-CM | POA: Diagnosis not present

## 2017-08-31 DIAGNOSIS — M9903 Segmental and somatic dysfunction of lumbar region: Secondary | ICD-10-CM | POA: Diagnosis not present

## 2017-08-31 DIAGNOSIS — M5137 Other intervertebral disc degeneration, lumbosacral region: Secondary | ICD-10-CM | POA: Diagnosis not present

## 2017-08-31 DIAGNOSIS — M4004 Postural kyphosis, thoracic region: Secondary | ICD-10-CM | POA: Diagnosis not present

## 2017-09-01 DIAGNOSIS — M4003 Postural kyphosis, cervicothoracic region: Secondary | ICD-10-CM | POA: Diagnosis not present

## 2017-09-01 DIAGNOSIS — M9902 Segmental and somatic dysfunction of thoracic region: Secondary | ICD-10-CM | POA: Diagnosis not present

## 2017-09-01 DIAGNOSIS — M4004 Postural kyphosis, thoracic region: Secondary | ICD-10-CM | POA: Diagnosis not present

## 2017-09-01 DIAGNOSIS — M5137 Other intervertebral disc degeneration, lumbosacral region: Secondary | ICD-10-CM | POA: Diagnosis not present

## 2017-09-01 DIAGNOSIS — M9903 Segmental and somatic dysfunction of lumbar region: Secondary | ICD-10-CM | POA: Diagnosis not present

## 2017-09-01 DIAGNOSIS — M9901 Segmental and somatic dysfunction of cervical region: Secondary | ICD-10-CM | POA: Diagnosis not present

## 2017-09-02 DIAGNOSIS — M9901 Segmental and somatic dysfunction of cervical region: Secondary | ICD-10-CM | POA: Diagnosis not present

## 2017-09-02 DIAGNOSIS — Z961 Presence of intraocular lens: Secondary | ICD-10-CM | POA: Diagnosis not present

## 2017-09-02 DIAGNOSIS — M4004 Postural kyphosis, thoracic region: Secondary | ICD-10-CM | POA: Diagnosis not present

## 2017-09-02 DIAGNOSIS — M5137 Other intervertebral disc degeneration, lumbosacral region: Secondary | ICD-10-CM | POA: Diagnosis not present

## 2017-09-02 DIAGNOSIS — M4003 Postural kyphosis, cervicothoracic region: Secondary | ICD-10-CM | POA: Diagnosis not present

## 2017-09-02 DIAGNOSIS — M9902 Segmental and somatic dysfunction of thoracic region: Secondary | ICD-10-CM | POA: Diagnosis not present

## 2017-09-02 DIAGNOSIS — M9903 Segmental and somatic dysfunction of lumbar region: Secondary | ICD-10-CM | POA: Diagnosis not present

## 2017-09-02 DIAGNOSIS — H43812 Vitreous degeneration, left eye: Secondary | ICD-10-CM | POA: Diagnosis not present

## 2017-09-06 DIAGNOSIS — M4003 Postural kyphosis, cervicothoracic region: Secondary | ICD-10-CM | POA: Diagnosis not present

## 2017-09-06 DIAGNOSIS — M9901 Segmental and somatic dysfunction of cervical region: Secondary | ICD-10-CM | POA: Diagnosis not present

## 2017-09-06 DIAGNOSIS — M5137 Other intervertebral disc degeneration, lumbosacral region: Secondary | ICD-10-CM | POA: Diagnosis not present

## 2017-09-06 DIAGNOSIS — M9903 Segmental and somatic dysfunction of lumbar region: Secondary | ICD-10-CM | POA: Diagnosis not present

## 2017-09-06 DIAGNOSIS — M9902 Segmental and somatic dysfunction of thoracic region: Secondary | ICD-10-CM | POA: Diagnosis not present

## 2017-09-06 DIAGNOSIS — M4004 Postural kyphosis, thoracic region: Secondary | ICD-10-CM | POA: Diagnosis not present

## 2017-09-07 DIAGNOSIS — M9902 Segmental and somatic dysfunction of thoracic region: Secondary | ICD-10-CM | POA: Diagnosis not present

## 2017-09-07 DIAGNOSIS — M4003 Postural kyphosis, cervicothoracic region: Secondary | ICD-10-CM | POA: Diagnosis not present

## 2017-09-07 DIAGNOSIS — M5137 Other intervertebral disc degeneration, lumbosacral region: Secondary | ICD-10-CM | POA: Diagnosis not present

## 2017-09-07 DIAGNOSIS — M4004 Postural kyphosis, thoracic region: Secondary | ICD-10-CM | POA: Diagnosis not present

## 2017-09-07 DIAGNOSIS — M9903 Segmental and somatic dysfunction of lumbar region: Secondary | ICD-10-CM | POA: Diagnosis not present

## 2017-09-07 DIAGNOSIS — M9901 Segmental and somatic dysfunction of cervical region: Secondary | ICD-10-CM | POA: Diagnosis not present

## 2017-09-09 DIAGNOSIS — M9903 Segmental and somatic dysfunction of lumbar region: Secondary | ICD-10-CM | POA: Diagnosis not present

## 2017-09-09 DIAGNOSIS — M5137 Other intervertebral disc degeneration, lumbosacral region: Secondary | ICD-10-CM | POA: Diagnosis not present

## 2017-09-09 DIAGNOSIS — M4003 Postural kyphosis, cervicothoracic region: Secondary | ICD-10-CM | POA: Diagnosis not present

## 2017-09-09 DIAGNOSIS — M9901 Segmental and somatic dysfunction of cervical region: Secondary | ICD-10-CM | POA: Diagnosis not present

## 2017-09-09 DIAGNOSIS — M4004 Postural kyphosis, thoracic region: Secondary | ICD-10-CM | POA: Diagnosis not present

## 2017-09-09 DIAGNOSIS — M9902 Segmental and somatic dysfunction of thoracic region: Secondary | ICD-10-CM | POA: Diagnosis not present

## 2017-09-17 DIAGNOSIS — M5137 Other intervertebral disc degeneration, lumbosacral region: Secondary | ICD-10-CM | POA: Diagnosis not present

## 2017-09-17 DIAGNOSIS — M4004 Postural kyphosis, thoracic region: Secondary | ICD-10-CM | POA: Diagnosis not present

## 2017-09-17 DIAGNOSIS — M9903 Segmental and somatic dysfunction of lumbar region: Secondary | ICD-10-CM | POA: Diagnosis not present

## 2017-09-17 DIAGNOSIS — M4003 Postural kyphosis, cervicothoracic region: Secondary | ICD-10-CM | POA: Diagnosis not present

## 2017-09-17 DIAGNOSIS — M9902 Segmental and somatic dysfunction of thoracic region: Secondary | ICD-10-CM | POA: Diagnosis not present

## 2017-09-17 DIAGNOSIS — M9901 Segmental and somatic dysfunction of cervical region: Secondary | ICD-10-CM | POA: Diagnosis not present

## 2017-09-22 DIAGNOSIS — M4004 Postural kyphosis, thoracic region: Secondary | ICD-10-CM | POA: Diagnosis not present

## 2017-09-22 DIAGNOSIS — M4003 Postural kyphosis, cervicothoracic region: Secondary | ICD-10-CM | POA: Diagnosis not present

## 2017-09-22 DIAGNOSIS — M9901 Segmental and somatic dysfunction of cervical region: Secondary | ICD-10-CM | POA: Diagnosis not present

## 2017-09-22 DIAGNOSIS — M9902 Segmental and somatic dysfunction of thoracic region: Secondary | ICD-10-CM | POA: Diagnosis not present

## 2017-09-22 DIAGNOSIS — M9903 Segmental and somatic dysfunction of lumbar region: Secondary | ICD-10-CM | POA: Diagnosis not present

## 2017-09-22 DIAGNOSIS — M5137 Other intervertebral disc degeneration, lumbosacral region: Secondary | ICD-10-CM | POA: Diagnosis not present

## 2017-09-23 DIAGNOSIS — M9903 Segmental and somatic dysfunction of lumbar region: Secondary | ICD-10-CM | POA: Diagnosis not present

## 2017-09-23 DIAGNOSIS — M5137 Other intervertebral disc degeneration, lumbosacral region: Secondary | ICD-10-CM | POA: Diagnosis not present

## 2017-09-23 DIAGNOSIS — M9902 Segmental and somatic dysfunction of thoracic region: Secondary | ICD-10-CM | POA: Diagnosis not present

## 2017-09-23 DIAGNOSIS — M4003 Postural kyphosis, cervicothoracic region: Secondary | ICD-10-CM | POA: Diagnosis not present

## 2017-09-23 DIAGNOSIS — M9901 Segmental and somatic dysfunction of cervical region: Secondary | ICD-10-CM | POA: Diagnosis not present

## 2017-09-23 DIAGNOSIS — M4004 Postural kyphosis, thoracic region: Secondary | ICD-10-CM | POA: Diagnosis not present

## 2017-09-28 DIAGNOSIS — M5137 Other intervertebral disc degeneration, lumbosacral region: Secondary | ICD-10-CM | POA: Diagnosis not present

## 2017-09-28 DIAGNOSIS — M4003 Postural kyphosis, cervicothoracic region: Secondary | ICD-10-CM | POA: Diagnosis not present

## 2017-09-28 DIAGNOSIS — M4004 Postural kyphosis, thoracic region: Secondary | ICD-10-CM | POA: Diagnosis not present

## 2017-09-28 DIAGNOSIS — M9901 Segmental and somatic dysfunction of cervical region: Secondary | ICD-10-CM | POA: Diagnosis not present

## 2017-09-28 DIAGNOSIS — M9902 Segmental and somatic dysfunction of thoracic region: Secondary | ICD-10-CM | POA: Diagnosis not present

## 2017-09-28 DIAGNOSIS — M9903 Segmental and somatic dysfunction of lumbar region: Secondary | ICD-10-CM | POA: Diagnosis not present

## 2017-09-30 DIAGNOSIS — M9902 Segmental and somatic dysfunction of thoracic region: Secondary | ICD-10-CM | POA: Diagnosis not present

## 2017-09-30 DIAGNOSIS — M9903 Segmental and somatic dysfunction of lumbar region: Secondary | ICD-10-CM | POA: Diagnosis not present

## 2017-09-30 DIAGNOSIS — M9901 Segmental and somatic dysfunction of cervical region: Secondary | ICD-10-CM | POA: Diagnosis not present

## 2017-09-30 DIAGNOSIS — M4003 Postural kyphosis, cervicothoracic region: Secondary | ICD-10-CM | POA: Diagnosis not present

## 2017-09-30 DIAGNOSIS — M4004 Postural kyphosis, thoracic region: Secondary | ICD-10-CM | POA: Diagnosis not present

## 2017-09-30 DIAGNOSIS — M5137 Other intervertebral disc degeneration, lumbosacral region: Secondary | ICD-10-CM | POA: Diagnosis not present

## 2017-10-05 DIAGNOSIS — M4003 Postural kyphosis, cervicothoracic region: Secondary | ICD-10-CM | POA: Diagnosis not present

## 2017-10-05 DIAGNOSIS — M9901 Segmental and somatic dysfunction of cervical region: Secondary | ICD-10-CM | POA: Diagnosis not present

## 2017-10-05 DIAGNOSIS — M9903 Segmental and somatic dysfunction of lumbar region: Secondary | ICD-10-CM | POA: Diagnosis not present

## 2017-10-05 DIAGNOSIS — M5137 Other intervertebral disc degeneration, lumbosacral region: Secondary | ICD-10-CM | POA: Diagnosis not present

## 2017-10-07 DIAGNOSIS — M9903 Segmental and somatic dysfunction of lumbar region: Secondary | ICD-10-CM | POA: Diagnosis not present

## 2017-10-07 DIAGNOSIS — M4003 Postural kyphosis, cervicothoracic region: Secondary | ICD-10-CM | POA: Diagnosis not present

## 2017-10-07 DIAGNOSIS — M9901 Segmental and somatic dysfunction of cervical region: Secondary | ICD-10-CM | POA: Diagnosis not present

## 2017-10-07 DIAGNOSIS — M5137 Other intervertebral disc degeneration, lumbosacral region: Secondary | ICD-10-CM | POA: Diagnosis not present

## 2017-10-13 DIAGNOSIS — L0103 Bullous impetigo: Secondary | ICD-10-CM | POA: Diagnosis not present

## 2017-10-13 DIAGNOSIS — M9901 Segmental and somatic dysfunction of cervical region: Secondary | ICD-10-CM | POA: Diagnosis not present

## 2017-10-13 DIAGNOSIS — M9903 Segmental and somatic dysfunction of lumbar region: Secondary | ICD-10-CM | POA: Diagnosis not present

## 2017-10-13 DIAGNOSIS — M5137 Other intervertebral disc degeneration, lumbosacral region: Secondary | ICD-10-CM | POA: Diagnosis not present

## 2017-10-13 DIAGNOSIS — M4003 Postural kyphosis, cervicothoracic region: Secondary | ICD-10-CM | POA: Diagnosis not present

## 2017-10-14 DIAGNOSIS — M5137 Other intervertebral disc degeneration, lumbosacral region: Secondary | ICD-10-CM | POA: Diagnosis not present

## 2017-10-14 DIAGNOSIS — M9901 Segmental and somatic dysfunction of cervical region: Secondary | ICD-10-CM | POA: Diagnosis not present

## 2017-10-14 DIAGNOSIS — M9903 Segmental and somatic dysfunction of lumbar region: Secondary | ICD-10-CM | POA: Diagnosis not present

## 2017-10-14 DIAGNOSIS — M4003 Postural kyphosis, cervicothoracic region: Secondary | ICD-10-CM | POA: Diagnosis not present

## 2017-10-19 DIAGNOSIS — M9901 Segmental and somatic dysfunction of cervical region: Secondary | ICD-10-CM | POA: Diagnosis not present

## 2017-10-19 DIAGNOSIS — M4003 Postural kyphosis, cervicothoracic region: Secondary | ICD-10-CM | POA: Diagnosis not present

## 2017-10-19 DIAGNOSIS — M5137 Other intervertebral disc degeneration, lumbosacral region: Secondary | ICD-10-CM | POA: Diagnosis not present

## 2017-10-19 DIAGNOSIS — M9903 Segmental and somatic dysfunction of lumbar region: Secondary | ICD-10-CM | POA: Diagnosis not present

## 2017-10-20 DIAGNOSIS — M4003 Postural kyphosis, cervicothoracic region: Secondary | ICD-10-CM | POA: Diagnosis not present

## 2017-10-20 DIAGNOSIS — M9903 Segmental and somatic dysfunction of lumbar region: Secondary | ICD-10-CM | POA: Diagnosis not present

## 2017-10-20 DIAGNOSIS — M9901 Segmental and somatic dysfunction of cervical region: Secondary | ICD-10-CM | POA: Diagnosis not present

## 2017-10-20 DIAGNOSIS — M5137 Other intervertebral disc degeneration, lumbosacral region: Secondary | ICD-10-CM | POA: Diagnosis not present

## 2017-10-26 DIAGNOSIS — M4003 Postural kyphosis, cervicothoracic region: Secondary | ICD-10-CM | POA: Diagnosis not present

## 2017-10-26 DIAGNOSIS — M9901 Segmental and somatic dysfunction of cervical region: Secondary | ICD-10-CM | POA: Diagnosis not present

## 2017-10-26 DIAGNOSIS — M5137 Other intervertebral disc degeneration, lumbosacral region: Secondary | ICD-10-CM | POA: Diagnosis not present

## 2017-10-26 DIAGNOSIS — M9903 Segmental and somatic dysfunction of lumbar region: Secondary | ICD-10-CM | POA: Diagnosis not present

## 2017-12-09 DIAGNOSIS — C61 Malignant neoplasm of prostate: Secondary | ICD-10-CM | POA: Diagnosis not present

## 2017-12-16 DIAGNOSIS — C61 Malignant neoplasm of prostate: Secondary | ICD-10-CM | POA: Diagnosis not present

## 2017-12-16 DIAGNOSIS — N400 Enlarged prostate without lower urinary tract symptoms: Secondary | ICD-10-CM | POA: Diagnosis not present

## 2017-12-16 DIAGNOSIS — N5201 Erectile dysfunction due to arterial insufficiency: Secondary | ICD-10-CM | POA: Diagnosis not present

## 2017-12-21 DIAGNOSIS — M179 Osteoarthritis of knee, unspecified: Secondary | ICD-10-CM | POA: Diagnosis not present

## 2017-12-21 DIAGNOSIS — G479 Sleep disorder, unspecified: Secondary | ICD-10-CM | POA: Diagnosis not present

## 2017-12-30 DIAGNOSIS — L570 Actinic keratosis: Secondary | ICD-10-CM | POA: Diagnosis not present

## 2017-12-30 DIAGNOSIS — L239 Allergic contact dermatitis, unspecified cause: Secondary | ICD-10-CM | POA: Diagnosis not present

## 2017-12-30 DIAGNOSIS — L814 Other melanin hyperpigmentation: Secondary | ICD-10-CM | POA: Diagnosis not present

## 2017-12-30 DIAGNOSIS — D225 Melanocytic nevi of trunk: Secondary | ICD-10-CM | POA: Diagnosis not present

## 2017-12-30 DIAGNOSIS — D1801 Hemangioma of skin and subcutaneous tissue: Secondary | ICD-10-CM | POA: Diagnosis not present

## 2018-02-15 DIAGNOSIS — M1990 Unspecified osteoarthritis, unspecified site: Secondary | ICD-10-CM | POA: Diagnosis not present

## 2018-02-15 DIAGNOSIS — G479 Sleep disorder, unspecified: Secondary | ICD-10-CM | POA: Diagnosis not present

## 2018-02-15 DIAGNOSIS — M542 Cervicalgia: Secondary | ICD-10-CM | POA: Diagnosis not present

## 2018-02-15 DIAGNOSIS — K222 Esophageal obstruction: Secondary | ICD-10-CM | POA: Diagnosis not present

## 2018-03-04 DIAGNOSIS — M40202 Unspecified kyphosis, cervical region: Secondary | ICD-10-CM | POA: Diagnosis not present

## 2018-03-04 DIAGNOSIS — R030 Elevated blood-pressure reading, without diagnosis of hypertension: Secondary | ICD-10-CM | POA: Diagnosis not present

## 2018-03-04 DIAGNOSIS — M542 Cervicalgia: Secondary | ICD-10-CM | POA: Diagnosis not present

## 2018-03-08 DIAGNOSIS — R1310 Dysphagia, unspecified: Secondary | ICD-10-CM | POA: Diagnosis not present

## 2018-03-08 DIAGNOSIS — K222 Esophageal obstruction: Secondary | ICD-10-CM | POA: Diagnosis not present

## 2018-03-08 DIAGNOSIS — K2970 Gastritis, unspecified, without bleeding: Secondary | ICD-10-CM | POA: Diagnosis not present

## 2018-04-28 DIAGNOSIS — M5030 Other cervical disc degeneration, unspecified cervical region: Secondary | ICD-10-CM | POA: Diagnosis not present

## 2018-04-28 DIAGNOSIS — M542 Cervicalgia: Secondary | ICD-10-CM | POA: Diagnosis not present

## 2018-04-28 DIAGNOSIS — M47812 Spondylosis without myelopathy or radiculopathy, cervical region: Secondary | ICD-10-CM | POA: Diagnosis not present

## 2018-04-28 DIAGNOSIS — M7918 Myalgia, other site: Secondary | ICD-10-CM | POA: Diagnosis not present

## 2018-05-03 DIAGNOSIS — M542 Cervicalgia: Secondary | ICD-10-CM | POA: Diagnosis not present

## 2018-05-05 NOTE — Progress Notes (Signed)
[name] [address], [place] [address] Phone: [phone] Subjective:        CC: Neck pain follow-up  [id number]  [name] is a 72 y.o. male coming in with complaint of neck pain. States that he can't hold his head up. Has mild pain. His neck is really tight. Loss of ROM. Has been in PT. No numbness and tingling noted.   Onset- 3 months Location- [place].   Character- Tight Aggravating factors- Rotation, extension Reliving factors-  Therapies tried- Ice, heat, oral meds.  Severity-7 out of 10   Patient did have neck x-rays taken on June 2018.  Independently visualized by me showing diffuse mild to moderate degenerative changes of multiple levels of the cervical spine.  History of esophagectomy with a vocal cord dysfunction or nerve injury after surgery.  Past Medical History:  Diagnosis Date  . Abdominal discomfort   . Dysrhythmia    occ pac  . Esophageal cancer ([place])    and prostate  . GERD (gastroesophageal reflux disease)   . Hoarseness   . Wears hearing aid    Past Surgical History:  Procedure Laterality Date  . DIRECT LARYNGOSCOPY WITH RADIAESSE INJECTION  02/08/2012   Procedure: DIRECT LARYNGOSCOPY WITH RADIAESSE INJECTION;  Surgeon: Jodi Marble, MD;  Location: [facility];  Service: ENT;  Laterality: Bilateral;  microdirect-laryngosocopy  with bilateral vocal cord radiesse injection  . DIRECT LARYNGOSCOPY WITH RADIAESSE INJECTION  03/21/2012   Procedure: DIRECT LARYNGOSCOPY WITH RADIAESSE INJECTION;  Surgeon: Jodi Marble, MD;  Location: [facility];  Service: ENT;  Laterality: N/A;  microdirect laryngoscopy with radiaesse injection left vocal cord  . DIRECT LARYNGOSCOPY WITH RADIAESSE INJECTION N/A 01/23/2013   Procedure: DIRECT LARYNGOSCOPY WITH RADIESSE VOCAL CORD AUGMENTATION LARYNGOPLASTY;  Surgeon: Jodi Marble, MD;  Location: [facility];  Service: ENT;   Laterality: N/A;  . ESOPHAGECTOMY  10/02/2010   Dr.Burney  . ESOPHAGOGASTRODUODENOSCOPY N/A 09/24/2015   Procedure: ESOPHAGOGASTRODUODENOSCOPY (EGD);  Surgeon: Teena Irani, MD;  Location: [facility];  Service: Endoscopy;  Laterality: N/A;  . ESOPHAGOGASTRODUODENOSCOPY N/A 12/12/2015   Procedure: ESOPHAGOGASTRODUODENOSCOPY (EGD);  Surgeon: Clarene Essex, MD;  Location: [facility];  Service: Endoscopy;  Laterality: N/A;  . ESOPHAGOSCOPY  03/21/2012   Procedure: ESOPHAGOSCOPY;  Surgeon: Jodi Marble, MD;  Location: [facility];  Service: ENT;  Laterality: N/A;  . JEJUNOSTOMY  09/29/2010   Dr.Burney  . MICROLARYNGOSCOPY W/VOCAL CORD INJECTION  08/01/2012   Procedure: MICROLARYNGOSCOPY WITH VOCAL CORD INJECTION;  Surgeon: Jodi Marble, MD;  Location: [facility];  Service: ENT;  Laterality: Left;  MICRO DIRECT LARYNGOSCOPY  AND RADIESSE  INJECTION   . MICROLARYNGOSCOPY W/VOCAL CORD INJECTION N/A 08/07/2013   Procedure: MICROLARYNGOSCOPY WITH RADIESSE VOCAL CORD AUGMENTATION ;  Surgeon: Jodi Marble, MD;  Location: [facility];  Service: ENT;  Laterality: N/A;  . MICROLARYNGOSCOPY W/VOCAL CORD INJECTION N/A 02/19/2014   Procedure: MICROLARYNGOSCOPY WITH BILATERAL RADIESSE  VOCAL CORD AUGMENTATION;  Surgeon: Jodi Marble, MD;  Location: [facility];  Service: ENT;  Laterality: N/A;  . PYLOROPLASTY  09/29/2010   Dr.Burney   Social History   Socioeconomic History  . Marital status: Married    Spouse name: Not on file  . Number of children: Not on file  . Years of education: Not on file  . Highest education level: Not on file  Occupational History  . Not on file  Social Needs  .  Financial resource strain: Not on file  . Food insecurity:    Worry: Not on file    Inability: Not on file  . Transportation needs:    Medical: Not on file    Non-medical: Not on file  Tobacco Use  . Smoking status: Former Smoker    Last attempt to quit:  06/13/2009    Years since quitting: 8.9  Substance and Sexual Activity  . Alcohol use: No  . Drug use: No  . Sexual activity: Not on file  Lifestyle  . Physical activity:    Days per week: Not on file    Minutes per session: Not on file  . Stress: Not on file  Relationships  . Social connections:    Talks on phone: Not on file    Gets together: Not on file    Attends religious service: Not on file    Active member of club or organization: Not on file    Attends meetings of clubs or organizations: Not on file    Relationship status: Not on file  Other Topics Concern  . Not on file  Social History Narrative  . Not on file   No Known Allergies No family history on file.  No family history of autoimmune   Past medical history, social, surgical and family history all reviewed in electronic medical record.  No pertanent information unless stated regarding to the chief complaint.   Review of Systems:Review of systems updated and as accurate as of 05/06/18  No headache, visual changes, nausea, vomiting, diarrhea, constipation, dizziness, abdominal pain, skin rash, fevers, chills, night sweats, weight loss, swollen lymph nodes, body aches, joint swelling, muscle aches, chest pain, shortness of breath, mood changes.   Objective  Blood pressure (!) 150/76, pulse 80, height 6' (1.829 m), weight 173 lb (78.5 kg), SpO2 97 %. Systems examined below as of 05/06/18   General: No apparent distress alert and oriented x3 mood and affect normal, dressed appropriately.  HEENT: Pupils equal, extraocular movements intact  Respiratory: Patient's speak in full sentences and does not appear short of breath  Cardiovascular: No lower extremity edema, non tender, no erythema  Skin: Warm dry intact with no signs of infection or rash on extremities or on axial skeleton.  Abdomen: Soft nontender  Neuro: Cranial nerves II through XII are intact, neurovascularly intact in all extremities with 2+ DTRs and 2+  pulses.  Lymph: No lymphadenopathy of posterior or anterior cervical chain or axillae bilaterally.  Gait normal with good balance and coordination.  MSK:  Non tender with full range of motion and good stability and symmetric strength and tone of shoulders, elbows, wrist, hip, knee and ankles bilaterally.  Neck: Inspection loss of lordosis with anterior incision well-healed. No palpable stepoffs. Negative Spurling's maneuver. Significant loss of extension and patient has some difficulty with the last 5 degrees of flexion.  Minimal sidebending bilaterally.  Crepitus noted Grip strength and sensation normal in bilateral hands Strength good C4 to T1 distribution No sensory change to C4 to T1 Negative Hoffman sign bilaterally Reflexes normal Tenderness to palpation in the trapezius bilaterally  [id number]; 15 additional minutes spent for Therapeutic exercises as stated in above notes.  This included exercises focusing on stretching, strengthening, with significant focus on eccentric aspects.   Long term goals include an improvement in range of motion, strength, endurance as well as avoiding reinjury. Patient's frequency would include in 1-2 times a day, 3-5 times a week for a duration of 6-12 weeks.  Exercises that included:  Basic scapular stabilization to include adduction and depression of scapula Scaption, focusing on proper movement and good control Internal and External rotation utilizing a theraband, with elbow tucked at side entire time Rows with theraband which was given  Proper technique shown and discussed handout in great detail with ATC.  All questions were discussed and answered. ]   Impression and Recommendations:     This case required medical decision making of moderate complexity.      Note: This dictation was prepared with Dragon dictation along with smaller phrase technology. Any transcriptional errors that result from this process are unintentional.

## 2018-05-06 ENCOUNTER — Encounter: Payer: Self-pay | Admitting: Family Medicine

## 2018-05-06 ENCOUNTER — Ambulatory Visit: Payer: Medicare HMO | Admitting: Family Medicine

## 2018-05-06 DIAGNOSIS — M5030 Other cervical disc degeneration, unspecified cervical region: Secondary | ICD-10-CM | POA: Diagnosis not present

## 2018-05-06 MED ORDER — VITAMIN D (ERGOCALCIFEROL) 1.25 MG (50000 UNIT) PO CAPS: 50000.0000 [IU] | ORAL_CAPSULE | ORAL | 0 refills | Status: DC

## 2018-05-06 NOTE — Assessment & Plan Note (Signed)
Degenerative cervical.  Discussed icing regimen and home exercise.  Discussed which activities to do which wants to avoid.  Topical anti-inflammatories given.  Do not feel that possible manipulation will be beneficial for this individual.  We discussed icing regimen and home exercises.  Encouraged ergonomic changes.  Follow-up again in 4 weeks

## 2018-05-06 NOTE — Patient Instructions (Addendum)
Good to see you  Overall you are doing well with all considering.  Ice 20 minutes 2 times daily. Usually after activity and before bed. pennsaid pinkie amount topically 2 times daily as needed.  Exercises 3 times a week.  Keep hands within peripheral vision  Once weekly vitamin D for 12 weeks.  Tart cherry extract any dose at night Keep working with Jenny Reichmann  See me again in 4-6 weeks

## 2018-05-24 DIAGNOSIS — M47812 Spondylosis without myelopathy or radiculopathy, cervical region: Secondary | ICD-10-CM | POA: Diagnosis not present

## 2018-05-24 DIAGNOSIS — M5020 Other cervical disc displacement, unspecified cervical region: Secondary | ICD-10-CM | POA: Diagnosis not present

## 2018-05-24 DIAGNOSIS — M542 Cervicalgia: Secondary | ICD-10-CM | POA: Diagnosis not present

## 2018-05-24 DIAGNOSIS — M4802 Spinal stenosis, cervical region: Secondary | ICD-10-CM | POA: Diagnosis not present

## 2018-06-03 DIAGNOSIS — R030 Elevated blood-pressure reading, without diagnosis of hypertension: Secondary | ICD-10-CM | POA: Diagnosis not present

## 2018-06-03 DIAGNOSIS — M4802 Spinal stenosis, cervical region: Secondary | ICD-10-CM | POA: Diagnosis not present

## 2018-06-06 NOTE — Progress Notes (Signed)
[name] [address], [place] [address] Phone: [phone] Subjective:   [name], am serving as a scribe for Dr. Hulan Saas.   CC: Neck pain follow-up  ELF:[id number]  [name] is a 72 y.o. male coming in with complaint of neck pain. He had MRI on 05/03/18. Patient states that he did have some improvement with exercises. Patient was told that he has stenosis and would need a laminectomy.  Patient did bring the MRI today.  Reviewed it in its entirety.  Patient's MRI did show severe degenerative arthritic changes at C3-C4 with facet joint effusion as well.  Patient also has a very large posterior disc bulge causing severe central spinal stenosis and moderate spinal stenosis at C4-C5     Past Medical History:  Diagnosis Date  . Abdominal discomfort   . Dysrhythmia    occ pac  . Esophageal cancer ([place])    and prostate  . GERD (gastroesophageal reflux disease)   . Hoarseness   . Wears hearing aid    Past Surgical History:  Procedure Laterality Date  . DIRECT LARYNGOSCOPY WITH RADIAESSE INJECTION  02/08/2012   Procedure: DIRECT LARYNGOSCOPY WITH RADIAESSE INJECTION;  Surgeon: Jodi Marble, MD;  Location: [facility];  Service: ENT;  Laterality: Bilateral;  microdirect-laryngosocopy  with bilateral vocal cord radiesse injection  . DIRECT LARYNGOSCOPY WITH RADIAESSE INJECTION  03/21/2012   Procedure: DIRECT LARYNGOSCOPY WITH RADIAESSE INJECTION;  Surgeon: Jodi Marble, MD;  Location: [facility];  Service: ENT;  Laterality: N/A;  microdirect laryngoscopy with radiaesse injection left vocal cord  . DIRECT LARYNGOSCOPY WITH RADIAESSE INJECTION N/A 01/23/2013   Procedure: DIRECT LARYNGOSCOPY WITH RADIESSE VOCAL CORD AUGMENTATION LARYNGOPLASTY;  Surgeon: Jodi Marble, MD;  Location: [facility];  Service: ENT;  Laterality: N/A;  . ESOPHAGECTOMY  10/02/2010   Dr.Burney  .  ESOPHAGOGASTRODUODENOSCOPY N/A 09/24/2015   Procedure: ESOPHAGOGASTRODUODENOSCOPY (EGD);  Surgeon: Teena Irani, MD;  Location[facility];  Service: Endoscopy;  Laterality: N/A;  . ESOPHAGOGASTRODUODENOSCOPY N/A 12/12/2015   Procedure: ESOPHAGOGASTRODUODENOSCOPY (EGD);  Surgeon: Clarene Essex, MD;  Location: [facility];  Service: Endoscopy;  Laterality: N/A;  . ESOPHAGOSCOPY  03/21/2012   Procedure: ESOPHAGOSCOPY;  Surgeon: Jodi Marble, MD;  Location: [facility];  Service: ENT;  Laterality: N/A;  . JEJUNOSTOMY  09/29/2010   Dr.Burney  . MICROLARYNGOSCOPY W/VOCAL CORD INJECTION  08/01/2012   Procedure: MICROLARYNGOSCOPY WITH VOCAL CORD INJECTION;  Surgeon: Jodi Marble, MD;  Location: [facility];  Service: ENT;  Laterality: Left;  MICRO DIRECT LARYNGOSCOPY  AND RADIESSE  INJECTION   . MICROLARYNGOSCOPY W/VOCAL CORD INJECTION N/A 08/07/2013   Procedure: MICROLARYNGOSCOPY WITH RADIESSE VOCAL CORD AUGMENTATION ;  Surgeon: Jodi Marble, MD;  Location: [facility];  Service: ENT;  Laterality: N/A;  . MICROLARYNGOSCOPY W/VOCAL CORD INJECTION N/A 02/19/2014   Procedure: MICROLARYNGOSCOPY WITH BILATERAL RADIESSE  VOCAL CORD AUGMENTATION;  Surgeon: Jodi Marble, MD;  Location: [facility];  Service: ENT;  Laterality: N/A;  . PYLOROPLASTY  09/29/2010   Dr.Burney   Social History   Socioeconomic History  . Marital status: Married    Spouse name: Not on file  . Number of children: Not on file  . Years of education: Not on file  . Highest education level: Not on file  Occupational History  . Not on file  Social Needs  . Financial resource strain: Not on file  . Food insecurity:  Worry: Not on file    Inability: Not on file  . Transportation needs:    Medical: Not on file    Non-medical: Not on file  Tobacco Use  . Smoking status: Former Smoker    Last attempt to quit: 06/13/2009    Years since quitting: 8.9  Substance and Sexual  Activity  . Alcohol use: [name]  . Drug use: [name]  . Sexual activity: Not on file  Lifestyle  . Physical activity:    Days per week: Not on file    Minutes per session: Not on file  . Stress: Not on file  Relationships  . Social connections:    Talks on phone: Not on file    Gets together: Not on file    Attends religious service: Not on file    Active member of club or organization: Not on file    Attends meetings of clubs or organizations: Not on file    Relationship status: Not on file  Other Topics Concern  . Not on file  Social History Narrative  . Not on file   [name] Known Allergies [name] family history on file.   Current Outpatient Medications (Cardiovascular):  .  nitroGLYCERIN (NITROSTAT) 0.4 MG SL tablet, Place 0.4 mg under the tongue every 5 (five) minutes as needed for chest pain.    Current Outpatient Medications (Analgesics):  .  traMADol (ULTRAM) 50 MG tablet, take 0.5 tablet (25 mg) by mouth every 6 hours if needed for pain   Current Outpatient Medications (Other):  .  hyoscyamine (LEVBID) 0.375 MG 12 hr tablet, Take 0.375 mg by mouth as directed. 1 tablet every 12 to 24 hours (per retail pharmacy) .  magnesium 30 MG tablet, Take 30 mg by mouth 2 (two) times daily. .  Multiple Vitamins-Minerals (ANTIOXIDANT FORMULA) TABS, Take 1 tablet by mouth daily. .  Multiple Vitamins-Minerals (MULTIVITAMIN WITH MINERALS) tablet, Take 1 tablet by mouth daily. [name]  omeprazole (PRILOSEC) 40 MG capsule, Take 40 mg by mouth daily.  .  tizanidine (ZANAFLEX) 2 MG capsule, Take 2 mg by mouth 3 (three) times daily. .  Vitamin D, Ergocalciferol, (DRISDOL) 50000 units CAPS capsule, Take 1 capsule (50,000 Units total) by mouth every 7 (seven) days. [name]  zolpidem (AMBIEN) 10 MG tablet, Take 5 mg by mouth at bedtime as needed for sleep.     Past medical history, social, surgical and family history all reviewed in electronic medical record.  [name] pertanent information unless stated regarding to the  chief complaint.   Review of Systems:  [name] headache, visual changes, nausea, vomiting, diarrhea, constipation, dizziness, abdominal pain, skin rash, fevers, chills, night sweats, weight loss, swollen lymph nodes, body aches, joint swelling, chest pain, shortness of breath, mood changes.  Positive muscle aches  Objective  Blood pressure 132/90, pulse 82, height 6' (1.829 m), weight 177 lb (80.3 kg), SpO2 98 %.    General: [name] apparent distress alert and oriented x3 mood and affect normal, dressed appropriately.  HEENT: Pupils equal, extraocular movements intact  Respiratory: Patient's speak in full sentences and does not appear short of breath  Cardiovascular: [name] lower extremity edema, non tender, [name] erythema  Skin: Warm dry intact with [name] signs of infection or rash on extremities or on axial skeleton.  Abdomen: Soft nontender  Neuro: Cranial nerves II through XII are intact, neurovascularly intact in all extremities with 2+ DTRs and 2+ pulses.  Lymph: [name] lymphadenopathy of posterior or anterior cervical chain or axillae bilaterally.  Gait  normal with good balance and coordination.  MSK:  Non tender with full range of motion and good stability and symmetric strength and tone of shoulders, elbows, wrist, hip, knee and ankles bilaterally.  Arthritic changes of multiple joints  Neck exam shows loss of lordosis.  Patient has crepitus in all range of motion.  [name] sidebending.  Lacks last 10 degrees of extension and does have crepitus.  Mild positive Spurling's on the radicular symptoms down the right arm.  She did have some loss of flexion of the neck noted as well.     Impression and Recommendations:      The above documentation has been reviewed and is accurate and complete Lyndal Pulley, DO       Note: This dictation was prepared with Dragon dictation along with smaller phrase technology. Any transcriptional errors that result from this process are unintentional.

## 2018-06-07 ENCOUNTER — Ambulatory Visit: Payer: Medicare HMO | Admitting: Family Medicine

## 2018-06-07 ENCOUNTER — Encounter: Payer: Self-pay | Admitting: Family Medicine

## 2018-06-07 DIAGNOSIS — M5030 Other cervical disc degeneration, unspecified cervical region: Secondary | ICD-10-CM | POA: Diagnosis not present

## 2018-06-07 NOTE — Assessment & Plan Note (Signed)
Severe overall.  Significant edema noted with severe spinal stenosis.  We discussed with patient about different medications we can attempt to try including the possibility of gabapentin but I am not optimistic that this will do well.  We discussed with him as well as his wife and answered other possibilities.  Patient though states that this is significantly spinal stenosis and concerning  Mild weakness in the right upper extremity as well noted.  At this point I would agree possible surgical intervention.  Patient will consider this.  We will get back to me with any other questions. Spent  25 minutes with patient face-to-face and had greater than 50% of counseling including as described above in assessment and plan.

## 2018-06-07 NOTE — Patient Instructions (Signed)
Good to see you  Continue the vitamins I would call us [phone] when you  know the date of the surgery and I will make sure you have a good team

## 2018-06-08 ENCOUNTER — Other Ambulatory Visit: Payer: Self-pay | Admitting: Neurosurgery

## 2018-06-14 NOTE — Pre-Procedure Instructions (Signed)
LEBARON BAUTCH  06/14/2018      [id number] - [place], [place] [address] Phone: [phone] Fax: [facility] [address], [place] [address] Phone: [phone] Fax: [phone]    Your procedure is scheduled on Monday September 30.  Report to [facility] Admitting at 11:40 A.M.  Call this number if you have problems the morning of surgery:  [phone]   Remember:  Do not eat or drink after midnight.    Take these medicines the morning of surgery with A SIP OF WATER:   Omeprazole (prilosec) Zanaflex if needed Tramadol (ultram) if needed  7 days prior to surgery STOP taking any Aspirin(unless otherwise instructed by your surgeon), Aleve, Naproxen, Ibuprofen, Motrin, Advil, Goody's, BC's, all herbal medications, fish oil, and all vitamins     Do not wear jewelry, make-up or nail polish.  Do not wear lotions, powders, or perfumes, or deodorant.  Do not shave 48 hours prior to surgery.  Men may shave face and neck.  Do not bring valuables to the hospital.  [facility] is not responsible for any belongings or valuables.  Contacts, dentures or bridgework may not be worn into surgery.  Leave your suitcase in the car.  After surgery it may be brought to your room.  For patients admitted to the hospital, discharge time will be determined by your treatment team.  Patients discharged the day of surgery will not be allowed to drive home.   Special instructions:    [facility]- Preparing For Surgery  Before surgery, you can play an important role. Because skin is not sterile, your skin needs to be as free of germs as possible. You can reduce the number of germs on your skin by washing with CHG (chlorahexidine gluconate) Soap before surgery.  CHG is an antiseptic cleaner which kills germs and bonds with the  skin to continue killing germs even after washing.    Oral Hygiene is also important to reduce your risk of infection.  Remember - BRUSH YOUR TEETH THE MORNING OF SURGERY WITH YOUR REGULAR TOOTHPASTE  Please do not use if you have an allergy to CHG or antibacterial soaps. If your skin becomes reddened/irritated stop using the CHG.  Do not shave (including legs and underarms) for at least 48 hours prior to first CHG shower. It is OK to shave your face.  Please follow these instructions carefully.   1. Shower the NIGHT BEFORE SURGERY and the MORNING OF SURGERY with CHG.   2. If you chose to wash your hair, wash your hair first as usual with your normal shampoo.  3. After you shampoo, rinse your hair and body thoroughly to remove the shampoo.  4. Use CHG as you would any other liquid soap. You can apply CHG directly to the skin and wash gently with a scrungie or a clean washcloth.   5. Apply the CHG Soap to your body ONLY FROM THE NECK DOWN.  Do not use on open wounds or open sores. Avoid contact with your eyes, ears, mouth and genitals (private parts). Wash Face and genitals (private parts)  with your normal soap.  6. Wash thoroughly, paying special attention to the area where your surgery will be performed.  7. Thoroughly rinse your body with warm water from the neck down.  8. DO NOT shower/wash with your normal soap  after using and rinsing off the CHG Soap.  9. Pat yourself dry with a CLEAN TOWEL.  10. Wear CLEAN PAJAMAS to bed the night before surgery, wear comfortable clothes the morning of surgery  11. Place CLEAN SHEETS on your bed the night of your first shower and DO NOT SLEEP WITH PETS.    Day of Surgery:  Do not apply any deodorants/lotions.  Please wear clean clothes to the hospital/surgery center.   Remember to brush your teeth WITH YOUR REGULAR TOOTHPASTE.    Please read over the following fact sheets that you were given. Coughing and Deep Breathing, MRSA  Information and Surgical Site Infection Prevention

## 2018-06-15 ENCOUNTER — Other Ambulatory Visit: Payer: Self-pay

## 2018-06-15 ENCOUNTER — Encounter (HOSPITAL_COMMUNITY): Payer: Self-pay

## 2018-06-15 ENCOUNTER — Encounter (HOSPITAL_COMMUNITY)
Admission: RE | Admit: 2018-06-15 | Discharge: 2018-06-15 | Disposition: A | Discharge status: Home / Self Care | Payer: Medicare HMO | Source: Ambulatory Visit | Attending: Neurosurgery | Admitting: Neurosurgery

## 2018-06-15 DIAGNOSIS — Z01812 Encounter for preprocedural laboratory examination: Secondary | ICD-10-CM | POA: Diagnosis not present

## 2018-06-15 LAB — TYPE AND SCREEN
ABO/RH(D): B POS
Antibody Screen: NEGATIVE

## 2018-06-15 LAB — CBC
HCT: 39.8 % (ref 39.0–52.0)
Hemoglobin: 11.4 g/dL — ABNORMAL LOW (ref 13.0–17.0)
MCH: 21.9 pg — ABNORMAL LOW (ref 26.0–34.0)
MCHC: 28.6 g/dL — ABNORMAL LOW (ref 30.0–36.0)
MCV: 76.4 fL — ABNORMAL LOW (ref 78.0–100.0)
Platelets: 332 10*3/uL (ref 150–400)
RBC: 5.21 MIL/uL (ref 4.22–5.81)
RDW: 20.4 % — ABNORMAL HIGH (ref 11.5–15.5)
WBC: 5.3 10*3/uL (ref 4.0–10.5)

## 2018-06-15 LAB — SURGICAL PCR SCREEN
MRSA, PCR: NEGATIVE
Staphylococcus aureus: NEGATIVE

## 2018-06-15 NOTE — Progress Notes (Signed)
PCP - Dr. Harle Battiest with [facility] Cardiologist - patient denies, did have cardiac work up in 2011 prior to major surgery for esophageal cancer, Dr. Marlou Porch did Echo and Stress test, both negative  Chest x-ray - n/a EKG - n/a Stress Test - 2011 at [facility] ECHO - 2011 at [facility] Cardiac Cath - patient denies  Sleep Study - patient denies   Anesthesia review: n/a  Patient denies shortness of breath, fever, cough and chest pain at PAT appointment   Patient verbalized understanding of instructions that were given to them at the PAT appointment. Patient was also instructed that they will need to review over the PAT instructions again at home before surgery.

## 2018-06-20 ENCOUNTER — Encounter (HOSPITAL_COMMUNITY)
Admission: RE | Disposition: A | Discharge status: Home / Self Care | Payer: Self-pay | Source: Ambulatory Visit | Attending: Neurosurgery

## 2018-06-20 ENCOUNTER — Inpatient Hospital Stay (HOSPITAL_COMMUNITY): Payer: Medicare HMO

## 2018-06-20 ENCOUNTER — Inpatient Hospital Stay (HOSPITAL_COMMUNITY): Payer: Medicare HMO | Admitting: Certified Registered Nurse Anesthetist

## 2018-06-20 ENCOUNTER — Other Ambulatory Visit: Payer: Self-pay

## 2018-06-20 ENCOUNTER — Encounter (HOSPITAL_COMMUNITY): Payer: Self-pay | Admitting: *Deleted

## 2018-06-20 ENCOUNTER — Inpatient Hospital Stay (HOSPITAL_COMMUNITY)
Admission: RE | Admit: 2018-06-20 | Discharge: 2018-06-21 | DRG: 473 | Disposition: A | Discharge status: Home / Self Care | Payer: Medicare HMO | Source: Ambulatory Visit | Attending: Neurosurgery | Admitting: Neurosurgery

## 2018-06-20 ENCOUNTER — Other Ambulatory Visit: Payer: Self-pay | Admitting: Neurosurgery

## 2018-06-20 DIAGNOSIS — Z8501 Personal history of malignant neoplasm of esophagus: Secondary | ICD-10-CM

## 2018-06-20 DIAGNOSIS — Z79899 Other long term (current) drug therapy: Secondary | ICD-10-CM

## 2018-06-20 DIAGNOSIS — Z974 Presence of external hearing-aid: Secondary | ICD-10-CM

## 2018-06-20 DIAGNOSIS — M4326 Fusion of spine, lumbar region: Secondary | ICD-10-CM | POA: Diagnosis not present

## 2018-06-20 DIAGNOSIS — K219 Gastro-esophageal reflux disease without esophagitis: Secondary | ICD-10-CM | POA: Diagnosis not present

## 2018-06-20 DIAGNOSIS — Z923 Personal history of irradiation: Secondary | ICD-10-CM

## 2018-06-20 DIAGNOSIS — Z981 Arthrodesis status: Secondary | ICD-10-CM | POA: Diagnosis not present

## 2018-06-20 DIAGNOSIS — M4802 Spinal stenosis, cervical region: Secondary | ICD-10-CM | POA: Diagnosis present

## 2018-06-20 DIAGNOSIS — M4712 Other spondylosis with myelopathy, cervical region: Principal | ICD-10-CM | POA: Diagnosis present

## 2018-06-20 DIAGNOSIS — Z87891 Personal history of nicotine dependence: Secondary | ICD-10-CM | POA: Diagnosis not present

## 2018-06-20 DIAGNOSIS — G8929 Other chronic pain: Secondary | ICD-10-CM | POA: Diagnosis not present

## 2018-06-20 DIAGNOSIS — G992 Myelopathy in diseases classified elsewhere: Secondary | ICD-10-CM | POA: Diagnosis present

## 2018-06-20 DIAGNOSIS — Z419 Encounter for procedure for purposes other than remedying health state, unspecified: Secondary | ICD-10-CM

## 2018-06-20 DIAGNOSIS — G959 Disease of spinal cord, unspecified: Secondary | ICD-10-CM | POA: Diagnosis not present

## 2018-06-20 DIAGNOSIS — M542 Cervicalgia: Secondary | ICD-10-CM | POA: Diagnosis not present

## 2018-06-20 HISTORY — PX: POSTERIOR CERVICAL FUSION/FORAMINOTOMY: SHX5038

## 2018-06-20 SURGERY — POSTERIOR CERVICAL FUSION/FORAMINOTOMY LEVEL 2
Anesthesia: General

## 2018-06-20 MED ORDER — FENTANYL CITRATE (PF) 100 MCG/2ML IJ SOLN
INTRAMUSCULAR | Status: AC
  Administered 2018-06-20: 50 ug via INTRAVENOUS
  Filled 2018-06-20: qty 2

## 2018-06-20 MED ORDER — FENTANYL CITRATE (PF) 100 MCG/2ML IJ SOLN
25.0000 ug | INTRAMUSCULAR | Status: DC | PRN
  Administered 2018-06-20 (×2): 50 ug via INTRAVENOUS

## 2018-06-20 MED ORDER — ACETAMINOPHEN 325 MG PO TABS: 650.0000 mg | ORAL_TABLET | ORAL | Status: DC | PRN

## 2018-06-20 MED ORDER — CYCLOBENZAPRINE HCL 10 MG PO TABS: 10.0000 mg | ORAL_TABLET | Freq: Three times a day (TID) | ORAL | Status: DC | PRN

## 2018-06-20 MED ORDER — BUPIVACAINE-EPINEPHRINE 0.5% -1:200000 IJ SOLN
INTRAMUSCULAR | Status: AC
  Filled 2018-06-20: qty 1

## 2018-06-20 MED ORDER — OXYCODONE HCL 5 MG PO TABS
5.0000 mg | ORAL_TABLET | ORAL | Status: DC | PRN
  Administered 2018-06-20 – 2018-06-21 (×2): 5 mg via ORAL
  Filled 2018-06-20 (×2): qty 1

## 2018-06-20 MED ORDER — TRAMADOL HCL 50 MG PO TABS: 50.0000 mg | ORAL_TABLET | Freq: Four times a day (QID) | ORAL | Status: DC | PRN

## 2018-06-20 MED ORDER — OXYCODONE HCL 5 MG PO TABS
5.0000 mg | ORAL_TABLET | Freq: Once | ORAL | Status: AC | PRN
  Administered 2018-06-20: 5 mg via ORAL

## 2018-06-20 MED ORDER — EPHEDRINE SULFATE-NACL 50-0.9 MG/10ML-% IV SOSY
PREFILLED_SYRINGE | INTRAVENOUS | Status: DC | PRN
  Administered 2018-06-20 (×2): 5 mg via INTRAVENOUS

## 2018-06-20 MED ORDER — PHENYLEPHRINE HCL 10 MG/ML IJ SOLN: INTRAMUSCULAR | Status: DC | PRN

## 2018-06-20 MED ORDER — LACTATED RINGERS IV SOLN: INTRAVENOUS | Status: DC

## 2018-06-20 MED ORDER — ONDANSETRON HCL 4 MG PO TABS: 4.0000 mg | ORAL_TABLET | Freq: Four times a day (QID) | ORAL | Status: DC | PRN

## 2018-06-20 MED ORDER — PANTOPRAZOLE SODIUM 40 MG PO TBEC
40.0000 mg | DELAYED_RELEASE_TABLET | Freq: Two times a day (BID) | ORAL | Status: DC
  Administered 2018-06-20 – 2018-06-21 (×2): 40 mg via ORAL
  Filled 2018-06-20 (×2): qty 1

## 2018-06-20 MED ORDER — SODIUM CHLORIDE 0.9 % IV SOLN
INTRAVENOUS | Status: DC | PRN
  Administered 2018-06-20: 15:00:00

## 2018-06-20 MED ORDER — ONDANSETRON HCL 4 MG/2ML IJ SOLN: 4.0000 mg | Freq: Four times a day (QID) | INTRAMUSCULAR | Status: DC | PRN

## 2018-06-20 MED ORDER — BUPIVACAINE-EPINEPHRINE (PF) 0.5% -1:200000 IJ SOLN
INTRAMUSCULAR | Status: DC | PRN
  Administered 2018-06-20: 10 mL via PERINEURAL

## 2018-06-20 MED ORDER — PROPOFOL 10 MG/ML IV BOLUS
INTRAVENOUS | Status: AC
  Filled 2018-06-20: qty 20

## 2018-06-20 MED ORDER — MIDAZOLAM HCL 5 MG/5ML IJ SOLN
INTRAMUSCULAR | Status: DC | PRN
  Administered 2018-06-20: 2 mg via INTRAVENOUS

## 2018-06-20 MED ORDER — FENTANYL CITRATE (PF) 250 MCG/5ML IJ SOLN
INTRAMUSCULAR | Status: AC
  Filled 2018-06-20: qty 5

## 2018-06-20 MED ORDER — SUGAMMADEX SODIUM 500 MG/5ML IV SOLN
INTRAVENOUS | Status: AC
  Filled 2018-06-20: qty 5

## 2018-06-20 MED ORDER — SODIUM CHLORIDE 0.9 % IV SOLN
INTRAVENOUS | Status: DC | PRN
  Administered 2018-06-20: 60 ug/min via INTRAVENOUS

## 2018-06-20 MED ORDER — ROCURONIUM BROMIDE 100 MG/10ML IV SOLN
INTRAVENOUS | Status: DC | PRN
  Administered 2018-06-20: 50 mg via INTRAVENOUS
  Administered 2018-06-20 (×2): 20 mg via INTRAVENOUS

## 2018-06-20 MED ORDER — PANTOPRAZOLE SODIUM 40 MG PO TBEC: 80.0000 mg | DELAYED_RELEASE_TABLET | Freq: Every day | ORAL | Status: DC

## 2018-06-20 MED ORDER — THROMBIN 20000 UNITS EX SOLR
CUTANEOUS | Status: DC | PRN
  Administered 2018-06-20: 15:00:00 via TOPICAL

## 2018-06-20 MED ORDER — EPHEDRINE 5 MG/ML INJ
INTRAVENOUS | Status: AC
  Filled 2018-06-20: qty 10

## 2018-06-20 MED ORDER — NITROGLYCERIN 0.4 MG SL SUBL: 0.4000 mg | SUBLINGUAL_TABLET | SUBLINGUAL | Status: DC | PRN

## 2018-06-20 MED ORDER — ESMOLOL HCL 100 MG/10ML IV SOLN
INTRAVENOUS | Status: AC
  Filled 2018-06-20: qty 10

## 2018-06-20 MED ORDER — LIDOCAINE 2% (20 MG/ML) 5 ML SYRINGE
INTRAMUSCULAR | Status: AC
  Filled 2018-06-20: qty 5

## 2018-06-20 MED ORDER — ONDANSETRON HCL 4 MG/2ML IJ SOLN
INTRAMUSCULAR | Status: AC
  Filled 2018-06-20: qty 2

## 2018-06-20 MED ORDER — OXYCODONE HCL 5 MG PO TABS
ORAL_TABLET | ORAL | Status: AC
  Filled 2018-06-20: qty 1

## 2018-06-20 MED ORDER — PHENYLEPHRINE 40 MCG/ML (10ML) SYRINGE FOR IV PUSH (FOR BLOOD PRESSURE SUPPORT)
PREFILLED_SYRINGE | INTRAVENOUS | Status: DC | PRN
  Administered 2018-06-20: 80 ug via INTRAVENOUS
  Administered 2018-06-20: 40 ug via INTRAVENOUS
  Administered 2018-06-20 (×4): 80 ug via INTRAVENOUS
  Administered 2018-06-20: 120 ug via INTRAVENOUS

## 2018-06-20 MED ORDER — PHENOL 1.4 % MT LIQD: 1.0000 | OROMUCOSAL | Status: DC | PRN

## 2018-06-20 MED ORDER — CEFAZOLIN SODIUM-DEXTROSE 2-4 GM/100ML-% IV SOLN
2.0000 g | Freq: Three times a day (TID) | INTRAVENOUS | Status: AC
  Administered 2018-06-20 – 2018-06-21 (×2): 2 g via INTRAVENOUS
  Filled 2018-06-20 (×2): qty 100

## 2018-06-20 MED ORDER — GLYCOPYRROLATE PF 0.2 MG/ML IJ SOSY
PREFILLED_SYRINGE | INTRAMUSCULAR | Status: AC
  Filled 2018-06-20: qty 1

## 2018-06-20 MED ORDER — BACITRACIN ZINC 500 UNIT/GM EX OINT
TOPICAL_OINTMENT | CUTANEOUS | Status: DC | PRN
  Administered 2018-06-20: 1 via TOPICAL

## 2018-06-20 MED ORDER — CHLORHEXIDINE GLUCONATE CLOTH 2 % EX PADS: 6.0000 | MEDICATED_PAD | Freq: Once | CUTANEOUS | Status: DC

## 2018-06-20 MED ORDER — ACETAMINOPHEN 650 MG RE SUPP: 650.0000 mg | RECTAL | Status: DC | PRN

## 2018-06-20 MED ORDER — FENTANYL CITRATE (PF) 100 MCG/2ML IJ SOLN
INTRAMUSCULAR | Status: DC | PRN
  Administered 2018-06-20 (×5): 50 ug via INTRAVENOUS

## 2018-06-20 MED ORDER — MORPHINE SULFATE (PF) 4 MG/ML IV SOLN: 4.0000 mg | INTRAVENOUS | Status: DC | PRN

## 2018-06-20 MED ORDER — THROMBIN (RECOMBINANT) 20000 UNITS EX SOLR
CUTANEOUS | Status: AC
  Filled 2018-06-20: qty 20000

## 2018-06-20 MED ORDER — PHENYLEPHRINE 40 MCG/ML (10ML) SYRINGE FOR IV PUSH (FOR BLOOD PRESSURE SUPPORT)
PREFILLED_SYRINGE | INTRAVENOUS | Status: AC
  Filled 2018-06-20: qty 10

## 2018-06-20 MED ORDER — TIZANIDINE HCL 2 MG PO TABS
2.0000 mg | ORAL_TABLET | Freq: Three times a day (TID) | ORAL | Status: DC | PRN
  Administered 2018-06-20: 2 mg via ORAL
  Filled 2018-06-20 (×3): qty 1

## 2018-06-20 MED ORDER — MAGNESIUM 250 MG PO TABS: 250.0000 mg | ORAL_TABLET | Freq: Three times a day (TID) | ORAL | Status: DC

## 2018-06-20 MED ORDER — ONDANSETRON HCL 4 MG/2ML IJ SOLN
INTRAMUSCULAR | Status: DC | PRN
  Administered 2018-06-20: 4 mg via INTRAVENOUS

## 2018-06-20 MED ORDER — OXYCODONE HCL 5 MG/5ML PO SOLN: 5.0000 mg | Freq: Once | ORAL | Status: AC | PRN

## 2018-06-20 MED ORDER — DEXAMETHASONE SODIUM PHOSPHATE 4 MG/ML IJ SOLN
INTRAMUSCULAR | Status: DC | PRN
  Administered 2018-06-20: 10 mg via INTRAVENOUS

## 2018-06-20 MED ORDER — CEFAZOLIN SODIUM-DEXTROSE 2-4 GM/100ML-% IV SOLN
2.0000 g | INTRAVENOUS | Status: AC
  Administered 2018-06-20: 2 g via INTRAVENOUS
  Filled 2018-06-20: qty 100

## 2018-06-20 MED ORDER — ROCURONIUM BROMIDE 50 MG/5ML IV SOSY
PREFILLED_SYRINGE | INTRAVENOUS | Status: AC
  Filled 2018-06-20: qty 5

## 2018-06-20 MED ORDER — BISACODYL 10 MG RE SUPP: 10.0000 mg | Freq: Every day | RECTAL | Status: DC | PRN

## 2018-06-20 MED ORDER — ACETAMINOPHEN 500 MG PO TABS
1000.0000 mg | ORAL_TABLET | Freq: Four times a day (QID) | ORAL | Status: DC
  Administered 2018-06-20 – 2018-06-21 (×3): 1000 mg via ORAL
  Filled 2018-06-20 (×3): qty 2

## 2018-06-20 MED ORDER — PROPOFOL 10 MG/ML IV BOLUS
INTRAVENOUS | Status: DC | PRN
  Administered 2018-06-20: 40 mg via INTRAVENOUS
  Administered 2018-06-20: 140 mg via INTRAVENOUS

## 2018-06-20 MED ORDER — MIDAZOLAM HCL 2 MG/2ML IJ SOLN
INTRAMUSCULAR | Status: AC
  Filled 2018-06-20: qty 2

## 2018-06-20 MED ORDER — 0.9 % SODIUM CHLORIDE (POUR BTL) OPTIME
TOPICAL | Status: DC | PRN
  Administered 2018-06-20: 1000 mL

## 2018-06-20 MED ORDER — SUGAMMADEX SODIUM 200 MG/2ML IV SOLN
INTRAVENOUS | Status: DC | PRN
  Administered 2018-06-20: 250 mg via INTRAVENOUS

## 2018-06-20 MED ORDER — LIDOCAINE 2% (20 MG/ML) 5 ML SYRINGE
INTRAMUSCULAR | Status: DC | PRN
  Administered 2018-06-20: 100 mg via INTRAVENOUS

## 2018-06-20 MED ORDER — LACTATED RINGERS IV SOLN
INTRAVENOUS | Status: DC | PRN
  Administered 2018-06-20 (×2): via INTRAVENOUS

## 2018-06-20 MED ORDER — DOCUSATE SODIUM 100 MG PO CAPS
100.0000 mg | ORAL_CAPSULE | Freq: Two times a day (BID) | ORAL | Status: DC
  Administered 2018-06-20 – 2018-06-21 (×2): 100 mg via ORAL
  Filled 2018-06-20 (×2): qty 1

## 2018-06-20 MED ORDER — ALUM & MAG HYDROXIDE-SIMETH 200-200-20 MG/5ML PO SUSP: 30.0000 mL | Freq: Four times a day (QID) | ORAL | Status: DC | PRN

## 2018-06-20 MED ORDER — ONDANSETRON HCL 4 MG/2ML IJ SOLN: 4.0000 mg | Freq: Once | INTRAMUSCULAR | Status: DC | PRN

## 2018-06-20 MED ORDER — PRAMIPEXOLE DIHYDROCHLORIDE 1.5 MG PO TABS
3.0000 mg | ORAL_TABLET | Freq: Every day | ORAL | Status: DC
  Administered 2018-06-20: 3 mg via ORAL
  Filled 2018-06-20: qty 2

## 2018-06-20 MED ORDER — OXYCODONE HCL 5 MG PO TABS
10.0000 mg | ORAL_TABLET | ORAL | Status: DC | PRN
  Administered 2018-06-21: 10 mg via ORAL
  Filled 2018-06-20: qty 2

## 2018-06-20 MED ORDER — MENTHOL 3 MG MT LOZG: 1.0000 | LOZENGE | OROMUCOSAL | Status: DC | PRN

## 2018-06-20 MED ORDER — BACITRACIN ZINC 500 UNIT/GM EX OINT
TOPICAL_OINTMENT | CUTANEOUS | Status: AC
  Filled 2018-06-20: qty 28.35

## 2018-06-20 MED ORDER — GLYCOPYRROLATE 0.2 MG/ML IJ SOLN
INTRAMUSCULAR | Status: DC | PRN
  Administered 2018-06-20: 0.1 mg via INTRAVENOUS

## 2018-06-20 MED ORDER — DEXAMETHASONE SODIUM PHOSPHATE 10 MG/ML IJ SOLN
INTRAMUSCULAR | Status: AC
  Filled 2018-06-20: qty 1

## 2018-06-20 SURGICAL SUPPLY — 65 items
APL SKNCLS STERI-STRIP NONHPOA (GAUZE/BANDAGES/DRESSINGS) ×1
BAG DECANTER FOR FLEXI CONT (MISCELLANEOUS) ×2 IMPLANT
BASKET BONE COLLECTION (BASKET) ×1 IMPLANT
BENZOIN TINCTURE PRP APPL 2/3 (GAUZE/BANDAGES/DRESSINGS) ×2 IMPLANT
BIT DRILL NEURO 2X3.1 SFT TUCH (MISCELLANEOUS) ×1 IMPLANT
BIT DRILL OCT ADJ 2.3 (BIT) ×1 IMPLANT
BLADE CLIPPER SURG (BLADE) ×1 IMPLANT
BLADE ULTRA TIP 2M (BLADE) IMPLANT
BUR PRECISION FLUTE 6.0 (BURR) ×1 IMPLANT
CANISTER SUCT 3000ML PPV (MISCELLANEOUS) ×2 IMPLANT
CAP CLSR POST CERV (Cap) ×6 IMPLANT
CARTRIDGE OIL MAESTRO DRILL (MISCELLANEOUS) ×1 IMPLANT
DIFFUSER DRILL AIR PNEUMATIC (MISCELLANEOUS) ×2 IMPLANT
DRAPE C-ARM 42X72 X-RAY (DRAPES) ×4 IMPLANT
DRAPE LAPAROTOMY 100X72 PEDS (DRAPES) ×2 IMPLANT
DRAPE MICROSCOPE LEICA (MISCELLANEOUS) IMPLANT
DRAPE POUCH INSTRU U-SHP 10X18 (DRAPES) ×2 IMPLANT
DRAPE SURG 17X23 STRL (DRAPES) ×6 IMPLANT
DRILL NEURO 2X3.1 SOFT TOUCH (MISCELLANEOUS) ×2
DRSG OPSITE POSTOP 4X6 (GAUZE/BANDAGES/DRESSINGS) ×1 IMPLANT
ELECT BLADE 4.0 EZ CLEAN MEGAD (MISCELLANEOUS) ×2
ELECT REM PT RETURN 9FT ADLT (ELECTROSURGICAL) ×2
ELECTRODE BLDE 4.0 EZ CLN MEGD (MISCELLANEOUS) ×1 IMPLANT
ELECTRODE REM PT RTRN 9FT ADLT (ELECTROSURGICAL) ×1 IMPLANT
GAUZE 4X4 16PLY RFD (DISPOSABLE) IMPLANT
GAUZE SPONGE 4X4 12PLY STRL (GAUZE/BANDAGES/DRESSINGS) ×2 IMPLANT
GLOVE BIO SURGEON STRL SZ8 (GLOVE) ×2 IMPLANT
GLOVE BIO SURGEON STRL SZ8.5 (GLOVE) ×2 IMPLANT
GLOVE BIOGEL PI IND STRL 6.5 (GLOVE) IMPLANT
GLOVE BIOGEL PI INDICATOR 6.5 (GLOVE) ×1
GLOVE EXAM NITRILE LRG STRL (GLOVE) IMPLANT
GLOVE EXAM NITRILE XL STR (GLOVE) IMPLANT
GLOVE EXAM NITRILE XS STR PU (GLOVE) IMPLANT
GLOVE SURG SS PI 6.0 STRL IVOR (GLOVE) ×1 IMPLANT
GOWN STRL REUS W/ TWL LRG LVL3 (GOWN DISPOSABLE) IMPLANT
GOWN STRL REUS W/ TWL XL LVL3 (GOWN DISPOSABLE) ×1 IMPLANT
GOWN STRL REUS W/TWL 2XL LVL3 (GOWN DISPOSABLE) IMPLANT
GOWN STRL REUS W/TWL LRG LVL3 (GOWN DISPOSABLE) ×4
GOWN STRL REUS W/TWL XL LVL3 (GOWN DISPOSABLE) ×2
KIT BASIN OR (CUSTOM PROCEDURE TRAY) ×2 IMPLANT
KIT TURNOVER KIT B (KITS) ×2 IMPLANT
NDL SPNL 18GX3.5 QUINCKE PK (NEEDLE) ×1 IMPLANT
NEEDLE HYPO 22GX1.5 SAFETY (NEEDLE) ×2 IMPLANT
NEEDLE SPNL 18GX3.5 QUINCKE PK (NEEDLE) ×2 IMPLANT
NS IRRIG 1000ML POUR BTL (IV SOLUTION) ×2 IMPLANT
OIL CARTRIDGE MAESTRO DRILL (MISCELLANEOUS) ×2
PACK LAMINECTOMY NEURO (CUSTOM PROCEDURE TRAY) ×2 IMPLANT
PAD ARMBOARD 7.5X6 YLW CONV (MISCELLANEOUS) ×6 IMPLANT
PIN MAYFIELD SKULL DISP (PIN) ×2 IMPLANT
ROD VIRAGE 3.5X35MM STRAIGHT (Cage) ×2 IMPLANT
RUBBERBAND STERILE (MISCELLANEOUS) IMPLANT
SCREW VIRAGE 3.5X14 (Screw) ×6 IMPLANT
SPONGE LAP 4X18 RFD (DISPOSABLE) IMPLANT
SPONGE SURGIFOAM ABS GEL 100 (HEMOSTASIS) ×2 IMPLANT
STAPLER SKIN PROX WIDE 3.9 (STAPLE) IMPLANT
STRIP CLOSURE SKIN 1/2X4 (GAUZE/BANDAGES/DRESSINGS) ×2 IMPLANT
SUT ETHILON 2 0 FS 18 (SUTURE) IMPLANT
SUT VIC AB 0 CT1 18XCR BRD8 (SUTURE) ×1 IMPLANT
SUT VIC AB 0 CT1 8-18 (SUTURE) ×2
SUT VIC AB 2-0 CP2 18 (SUTURE) ×2 IMPLANT
TOWEL GREEN STERILE (TOWEL DISPOSABLE) ×2 IMPLANT
TOWEL GREEN STERILE FF (TOWEL DISPOSABLE) ×2 IMPLANT
TRAY FOLEY MTR SLVR 16FR STAT (SET/KITS/TRAYS/PACK) ×1 IMPLANT
UNDERPAD 30X30 (UNDERPADS AND DIAPERS) IMPLANT
WATER STERILE IRR 1000ML POUR (IV SOLUTION) ×2 IMPLANT

## 2018-06-20 NOTE — Anesthesia Preprocedure Evaluation (Addendum)
Anesthesia Evaluation  Patient identified by MRN, date of birth, ID band Patient awake    Reviewed: Allergy & Precautions, NPO status , Patient's Chart, lab work & pertinent test results  Airway Mallampati: II  TM Distance: >3 FB Neck ROM: Limited    Dental  (+) Teeth Intact, Dental Advisory Given   Pulmonary former smoker,    breath sounds clear to auscultation       Cardiovascular  Rhythm:Regular Rate:Normal     Neuro/Psych    GI/Hepatic   Endo/Other    Renal/GU      Musculoskeletal   Abdominal   Peds  Hematology   Anesthesia Other Findings   Reproductive/Obstetrics                            Anesthesia Physical Anesthesia Plan  ASA: III  Anesthesia Plan: General   Post-op Pain Management:    Induction: Intravenous  PONV Risk Score and Plan: Ondansetron and Dexamethasone  Airway Management Planned: Oral ETT and Video Laryngoscope Planned  Additional Equipment:   Intra-op Plan:   Post-operative Plan: Extubation in OR  Informed Consent: I have reviewed the patients History and Physical, chart, labs and discussed the procedure including the risks, benefits and alternatives for the proposed anesthesia with the patient or authorized representative who has indicated his/her understanding and acceptance.   Dental advisory given  Plan Discussed with: CRNA and Anesthesiologist  Anesthesia Plan Comments: (Limited ROM of neck  H/O Vocal cord paralysis S/P VC implant S/P esophagectomy 2012 for adenocarcinoma  Plan GA with 6.5 ETT and glide scope  Roberts Gaudy )        Anesthesia Quick Evaluation

## 2018-06-20 NOTE — H&P (Signed)
Subjective: The patient is a 72 year old white male with a history of esophageal cancer, surgery and radiation who is complained of neck pain with arm numbness and weakness.  He has failed medical management.  He was worked up with a cervical MRI which demonstrated the patient had significant stenosis at C3-4 and C4-5.  I discussed the various treatment options with the patient.  He has weighed the risks, benefits and alternatives surgery and decided to proceed with a C3-4 C4-5 posterior cervical laminectomy, instrumentation and fusion.  Past Medical History:  Diagnosis Date  . Abdominal discomfort   . Dysrhythmia    occ pac  . Esophageal cancer ([place])    and prostate  . GERD (gastroesophageal reflux disease)   . Hoarseness   . Wears hearing aid     Past Surgical History:  Procedure Laterality Date  . DIRECT LARYNGOSCOPY WITH RADIAESSE INJECTION  02/08/2012   Procedure: DIRECT LARYNGOSCOPY WITH RADIAESSE INJECTION;  Surgeon: Jodi Marble, MD;  Location: [place];  Service: ENT;  Laterality: Bilateral;  microdirect-laryngosocopy  with bilateral vocal cord radiesse injection  . DIRECT LARYNGOSCOPY WITH RADIAESSE INJECTION  03/21/2012   Procedure: DIRECT LARYNGOSCOPY WITH RADIAESSE INJECTION;  Surgeon: Jodi Marble, MD;  Location: [place];  Service: ENT;  Laterality: N/A;  microdirect laryngoscopy with radiaesse injection left vocal cord  . DIRECT LARYNGOSCOPY WITH RADIAESSE INJECTION N/A 01/23/2013   Procedure: DIRECT LARYNGOSCOPY WITH RADIESSE VOCAL CORD AUGMENTATION LARYNGOPLASTY;  Surgeon: Jodi Marble, MD;  Location: [place];  Service: ENT;  Laterality: N/A;  . ESOPHAGECTOMY  10/02/2010   Dr.Burney  . ESOPHAGOGASTRODUODENOSCOPY N/A 09/24/2015   Procedure: ESOPHAGOGASTRODUODENOSCOPY (EGD);  Surgeon: Teena Irani, MD;  Location: [facility];  Service: Endoscopy;  Laterality: N/A;  . ESOPHAGOGASTRODUODENOSCOPY N/A 12/12/2015   Procedure:  ESOPHAGOGASTRODUODENOSCOPY (EGD);  Surgeon: Clarene Essex, MD;  Location: [facility];  Service: Endoscopy;  Laterality: N/A;  . ESOPHAGOSCOPY  03/21/2012   Procedure: ESOPHAGOSCOPY;  Surgeon: Jodi Marble, MD;  Location: [place];  Service: ENT;  Laterality: N/A;  . JEJUNOSTOMY  09/29/2010   Dr.Burney  . LARYNGOPLASTY  2016  . MICROLARYNGOSCOPY W/VOCAL CORD INJECTION  08/01/2012   Procedure: MICROLARYNGOSCOPY WITH VOCAL CORD INJECTION;  Surgeon: Jodi Marble, MD;  Location: [place];  Service: ENT;  Laterality: Left;  MICRO DIRECT LARYNGOSCOPY  AND RADIESSE  INJECTION   . MICROLARYNGOSCOPY W/VOCAL CORD INJECTION N/A 08/07/2013   Procedure: MICROLARYNGOSCOPY WITH RADIESSE VOCAL CORD AUGMENTATION ;  Surgeon: Jodi Marble, MD;  Location: [place];  Service: ENT;  Laterality: N/A;  . MICROLARYNGOSCOPY W/VOCAL CORD INJECTION N/A 02/19/2014   Procedure: MICROLARYNGOSCOPY WITH BILATERAL RADIESSE  VOCAL CORD AUGMENTATION;  Surgeon: Jodi Marble, MD;  Location: [place];  Service: ENT;  Laterality: N/A;  . PYLOROPLASTY  09/29/2010   Dr.Burney    No Known Allergies  Social History   Tobacco Use  . Smoking status: Former Smoker    Last attempt to quit: 06/13/2009    Years since quitting: 9.0  . Smokeless tobacco: Never Used  Substance Use Topics  . Alcohol use: Yes    Alcohol/week: 10.0 standard drinks    Types: 10 Standard drinks or equivalent per week    Comment: avg 10 Jack Daniels/week    History reviewed. No pertinent family history. Prior to Admission medications   Medication Sig Start Date End Date Taking? Authorizing Provider  Diclofenac Sodium (PENNSAID) 2 % SOLN Place 1 application onto the skin  2 (two) times daily as needed (pain).   Yes [provider]  Magnesium 250 MG TABS Take 250 mg by mouth 3 (three) times daily.   Yes [provider]  Menthol, Topical Analgesic, (BIOFREEZE EX) Apply 1  application topically daily as needed (muscle pain).   Yes [provider]  Misc Natural Products (TART CHERRY ADVANCED) CAPS Take 3 capsules by mouth every evening.   Yes [provider]  Multiple Vitamin (MULTIVITAMIN WITH MINERALS) TABS tablet Take 1 tablet by mouth daily.   Yes [provider]  Multiple Vitamins-Minerals (ANTIOXIDANT FORMULA) TABS Take 1 tablet by mouth daily.   Yes [provider]  naproxen (NAPROSYN) 500 MG tablet Take 500 mg by mouth daily as needed for moderate pain.   Yes [provider]  omeprazole (PRILOSEC) 40 MG capsule Take 40 mg by mouth daily.  01/21/14  Yes [provider]  pramipexole (MIRAPEX) 1 MG tablet Take 3 tablets by mouth at bedtime. 06/02/18  Yes [provider]  sildenafil (REVATIO) 20 MG tablet Take 40-60 mg by mouth daily as needed (erectile dysfunction).   Yes [provider]  tizanidine (ZANAFLEX) 2 MG capsule Take 2 mg by mouth 3 (three) times daily as needed for muscle spasms.    Yes [provider]  traMADol (ULTRAM) 50 MG tablet Take 0.25 mg by mouth 3 (three) times daily as needed for moderate pain.  02/25/15  Yes [provider]  triamcinolone cream (KENALOG) 0.1 % Apply 1 application topically daily as needed (rash).   Yes [provider]  Vitamin D, Ergocalciferol, (DRISDOL) 50000 units CAPS capsule Take 1 capsule (50,000 Units total) by mouth every 7 (seven) days. 05/06/18  Yes Lyndal Pulley, DO  nitroGLYCERIN (NITROSTAT) 0.4 MG SL tablet Place 0.4 mg under the tongue every 5 (five) minutes as needed (esophageal spasms).  09/23/15   [provider]  OVER THE COUNTER MEDICATION Take 1 capsule by mouth at bedtime as needed (sleep). Calms Forte otc supplement    [provider]     Review of Systems  Positive ROS: As above  All other systems have been reviewed and were otherwise negative with the exception of those mentioned in the  HPI and as above.  Objective: Vital signs in last 24 hours: Temp:  [97.7 F (36.5 C)] 97.7 F (36.5 C) (09/30 1156) Pulse Rate:  [88] 88 (09/30 1156) Resp:  [20] 20 (09/30 1156) BP: (121)/(60) 121/60 (09/30 1156) SpO2:  [98 %] 98 % (09/30 1156) Weight:  [80.3 kg] 80.3 kg (09/30 1156) Estimated body mass index is 24.01 kg/m as calculated from the following:   Height as of this encounter: 6' (1.829 m).   Weight as of this encounter: 80.3 kg.   General Appearance: Alert Head: Normocephalic, without obvious abnormality, atraumatic Eyes: PERRL, conjunctiva/corneas clear, EOM's intact,    Ears: Normal  Throat: Normal  Neck: The patient has a straight and cervical spine and a limited range of motion. Back: unremarkable Lungs: Clear to auscultation bilaterally, respirations unlabored Heart: Regular rate and rhythm, no murmur, rub or gallop Abdomen: Soft, non-tender Extremities: Extremities normal, atraumatic, no cyanosis or edema Skin: unremarkable  NEUROLOGIC:   Mental status: alert and oriented,Motor Exam - grossly normal Sensory Exam - grossly normal Reflexes:  Coordination - grossly normal Gait - grossly normal Balance - grossly normal Cranial Nerves: I: smell Not tested  II: visual acuity  OS: Normal  OD: Normal   II: visual fields Full  to confrontation  II: pupils Equal, round, reactive to light  III,VII: ptosis None  III,IV,VI: extraocular muscles  Full ROM  V: mastication Normal  V: facial light touch sensation  Normal  V,VII: corneal reflex  Present  VII: facial muscle function - upper  Normal  VII: facial muscle function - lower Normal  VIII: hearing Not tested  IX: soft palate elevation  Normal  IX,X: gag reflex Present  XI: trapezius strength  5/5  XI: sternocleidomastoid strength 5/5  XI: neck flexion strength  5/5  XII: tongue strength  Normal    Data Review Lab Results  Component Value Date   WBC 5.3 06/15/2018   HGB 11.4 (L) 06/15/2018   HCT  39.8 06/15/2018   MCV 76.4 (L) 06/15/2018   PLT 332 06/15/2018   Lab Results  Component Value Date   NA 142 09/23/2015   K 4.2 09/23/2015   CL 105 09/23/2015   CO2 27 09/23/2015   BUN 21 (H) 09/23/2015   CREATININE 1.09 09/23/2015   GLUCOSE 126 (H) 09/23/2015   Lab Results  Component Value Date   INR 1.11 09/24/2015    Assessment/Plan: C3-4 and C4-5 spondylosis, stenosis, cervicalgia, cervical radiculopathy, cervical myelopathy I discussed situation with the patient.  I reviewed his imaging studies with him and pointed out the abnormalities.  We have discussed the various treatment options including surgery.  I have described the surgical treatment option of a C3-4 and C4-5 laminectomy, instrumentation, and fusion.  I have shown him surgical models.  I have given him a surgical pamphlet.  We have discussed the risks, benefits, alternatives, expected postoperative course, and likelihood of achieving our goals with surgery.  I have answered all the patient's, and his wife's, questions.  He has decided to proceed with surgery.   Ophelia Charter 06/20/2018 2:00 PM

## 2018-06-20 NOTE — Progress Notes (Signed)
Subjective: The patient is somnolent but easily arousable.  He is in no apparent distress.  Objective: Vital signs in last 24 hours: Temp:  [97.7 F (36.5 C)-98.1 F (36.7 C)] 98.1 F (36.7 C) (09/30 1715) Pulse Rate:  [88-98] 98 (09/30 1730) Resp:  [13-20] 13 (09/30 1730) BP: (121-140)/(60-80) 130/68 (09/30 1730) SpO2:  [97 %-100 %] 97 % (09/30 1730) Weight:  [80.3 kg] 80.3 kg (09/30 1156) Estimated body mass index is 24.01 kg/m as calculated from the following:   Height as of this encounter: 6' (1.829 m).   Weight as of this encounter: 80.3 kg.   Intake/Output from previous day: No intake/output data recorded. Intake/Output this shift: Total I/O In: 2040 [P.O.:240; I.V.:1800] Out: 150 [Urine:150]  Physical exam the patient is somnolent but arousable.  He is moving all 4 extremities well.  Lab Results: No results for input(s): WBC, HGB, HCT, PLT in the last 72 hours. BMET No results for input(s): NA, K, CL, CO2, GLUCOSE, BUN, CREATININE, CALCIUM in the last 72 hours.  Studies/Results: Dg Cervical Spine 2-3 Views  Result Date: 06/20/2018 CLINICAL DATA:  POSTERIOR CERVICAL LAMINECTOMY, POSTERIOR INSTRUMENTATION AND FUSION CERVICAL THREE-FOUR, CERVICAL FOUR-FIVE for CERVICAL STENOSIS. RSTO performed by Hampton Abbot RT-R Fluoro Time is 27 secs EXAM: CERVICAL SPINE - 2-3 VIEW; DG C-ARM 61-120 MIN COMPARISON:  Images from PET-CT on 06/24/2010 and 09/03/2010 FINDINGS: Two images are submitted. The LATERAL view shows posterior fusion of C3, C4, and C5 with bilateral transpedicular screws and posterior fixation. Alignment is normal. The second view is a frontal projection of the same fusion. IMPRESSION: Status post fusion from C3-C5. Electronically Signed   By: Nolon Nations M.D.   On: 06/20/2018 17:27   Dg C-arm 1-60 Min  Result Date: 06/20/2018 CLINICAL DATA:  POSTERIOR CERVICAL LAMINECTOMY, POSTERIOR INSTRUMENTATION AND FUSION CERVICAL THREE-FOUR, CERVICAL FOUR-FIVE for  CERVICAL STENOSIS. RSTO performed by Hampton Abbot RT-R Fluoro Time is 27 secs EXAM: CERVICAL SPINE - 2-3 VIEW; DG C-ARM 61-120 MIN COMPARISON:  Images from PET-CT on 06/24/2010 and 09/03/2010 FINDINGS: Two images are submitted. The LATERAL view shows posterior fusion of C3, C4, and C5 with bilateral transpedicular screws and posterior fixation. Alignment is normal. The second view is a frontal projection of the same fusion. IMPRESSION: Status post fusion from C3-C5. Electronically Signed   By: Nolon Nations M.D.   On: 06/20/2018 17:27   Dg C-arm 1-60 Min  Result Date: 06/20/2018 CLINICAL DATA:  POSTERIOR CERVICAL LAMINECTOMY, POSTERIOR INSTRUMENTATION AND FUSION CERVICAL THREE-FOUR, CERVICAL FOUR-FIVE for CERVICAL STENOSIS. RSTO performed by Hampton Abbot RT-R Fluoro Time is 27 secs EXAM: CERVICAL SPINE - 2-3 VIEW; DG C-ARM 61-120 MIN COMPARISON:  Images from PET-CT on 06/24/2010 and 09/03/2010 FINDINGS: Two images are submitted. The LATERAL view shows posterior fusion of C3, C4, and C5 with bilateral transpedicular screws and posterior fixation. Alignment is normal. The second view is a frontal projection of the same fusion. IMPRESSION: Status post fusion from C3-C5. Electronically Signed   By: Nolon Nations M.D.   On: 06/20/2018 17:27    Assessment/Plan: The patient is doing well.  I spoke with his wife.  LOS: 0 days     [name] 06/20/2018, 5:42 PM

## 2018-06-20 NOTE — Anesthesia Procedure Notes (Addendum)
Procedure Name: Intubation Date/Time: 06/20/2018 2:11 PM Performed by: Orlie Dakin, CRNA Pre-anesthesia Checklist: Patient identified, Emergency Drugs available, Suction available and Patient being monitored Patient Re-evaluated:Patient Re-evaluated prior to induction Oxygen Delivery Method: Circle system utilized Preoxygenation: Pre-oxygenation with 100% oxygen Induction Type: IV induction Ventilation: Mask ventilation without difficulty Laryngoscope Size: Glidescope and 4 Grade View: Grade I Tube type: Oral Tube size: 6.5 mm Number of attempts: 1 Airway Equipment and Method: Stylet and Video-laryngoscopy Placement Confirmation: ETT inserted through vocal cords under direct vision,  positive ETCO2 and breath sounds checked- equal and bilateral Secured at: 22 cm Tube secured with: Tape Dental Injury: Teeth and Oropharynx as per pre-operative assessment  Comments: In Short-Stay many upper and lower front teeth worn down, chipped and un-even edges.  Glidescope used due to H/O vocal cord surgeries and limited ROM of neck.

## 2018-06-20 NOTE — Transfer of Care (Signed)
Immediate Anesthesia Transfer of Care Note  Patient: [name]  Procedure(s) Performed: POSTERIOR CERVICAL LAMINECTOMY, POSTERIOR INSTRUMENTATION AND FUSION CERVICAL THREE-FOUR, CERVICAL FOUR-FIVE (N/A )  Patient Location: PACU  Anesthesia Type:General  Level of Consciousness: awake and patient cooperative  Airway & Oxygen Therapy: Patient Spontanous Breathing and Patient connected to face mask oxygen  Post-op Assessment: Report given to RN, Post -op Vital signs reviewed and stable and Patient moving all extremities X 4  Post vital signs: Reviewed and stable  Last Vitals:  Vitals Value Taken Time  BP 140/80 06/20/2018  5:15 PM  Temp    Pulse 98 06/20/2018  5:16 PM  Resp 17 06/20/2018  5:15 PM  SpO2 100 % 06/20/2018  5:16 PM  Vitals shown include unvalidated device data.  Last Pain:  Vitals:   06/20/18 1259  TempSrc:   PainSc: 5       Patients Stated Pain Goal: 2 (57/50/51 8335)  Complications: No apparent anesthesia complications

## 2018-06-20 NOTE — Op Note (Signed)
Brief history: The patient is a 72 year old white male who is complained of chronic neck pain.  He has failed medical management.  He was worked up with a cervical MRI which demonstrated spinal stenosis most prominent at C3-4 and C4-5.  I discussed the various treatment options with the patient and his wife.  He has weighed the risks, benefits, and alternatives of surgery and decided proceed with a cervical laminectomy, instrumentation and fusion.  Preop diagnosis: C3-4 and C4-5 spinal stenosis, cervicalgia, cervical myelopathy  Postop diagnosis: The same  Procedure: C3-4 and C4-5 decompressive laminectomy; C3-4 and C4-5 posterior lateral arthrodesis with local morselized autograft bone; posterior lateral meatus titanium instrumentation C3-C5 with Zimmer titanium lateral mass screws and rods  Surgeon: Dr. Earle Gell  Assistant: Arnetha Massy, nurse practitioner  Anesthesia: General tracheal  Estimated blood loss: 125 cc  Specimens: None  Drains: None  Complications: None  Description of procedure: The patient was brought to the operating room by the anesthesia team.  General endotracheal anesthesia was induced.  I then applied the [place] three-point headrest to the patient's calvarium.  He was carefully turned to the prone position on the chest rolls.  The patient's suboccipital region was then shaved with clippers in this region as well as posterior neck and upper thorax was prepared with Betadine scrub and Betadine solution.  Sterile drapes were applied.  I then injected the area to be incised with Marcaine with epinephrine solution.  Ice to scalpel to make a linear midline incision over C3-4 and C4-5 interspaces.  I used electrocautery to perform a bilateral subperiosteal dissection exposing the spinous process and lamina of C3, C4, C5.  We used intraoperative fluoroscopy to confirm our location.  We inserted the [facility] and cerebellar retractors for exposure.  We began the  decompression by incising the interspinous ligament at C2-3, C3-4, C4-5 and C5-6.  We then used the rongeurs to remove the spinous process at C3, C4 and C5.  I then used a high-speed drill to thin out the lamina at C3, C4 and C5.  We completed the laminectomy with the Kerrison punches and remove the C2-3, C3-4, C4-5 and C5-6 ligamentum flavum decompressing the thecal sac.  Having completed the decompression we now turned attention to the instrumentation.  Using intraoperative fluoroscopy identified the center of the lateral masses bilaterally at C3, C4 and C5.  We drilled a 40 mill hole in the cephalad and lateral direction at these lateral masses.  We probed inside the drill hole and rule out cortical breaches.  We then inserted a 14 mm polyaxial titanium screw into the lateral masses bilaterally at C3, C4 and C5.  We got good bony purchase.  We then connected the unilateral screws with a rod.  We secured the rod in place with the caps which we tightened appropriately completing the instrumentation.  We now turned attention to the arthrodesis.  We used a high-speed drill to decorticate the lateral masses facets, etc. at C3-4 and C4-5 bilaterally.  We laid local morselized autograft bone we obtained during the decompression over the decorticated posterior lateral structures completing the posterior lateral arthrodesis at C3-4 and C4-5.  We obtained hemostasis using bipolar electrocautery.  We removed the retractors.  We then reapproximated the patient's cervical thoracic fascia with interrupted 0 Vicryl suture.  We reapproximated the subcutaneous tissue with interrupted 2-0 Vicryl suture.  We were approximate skin with Steri-Strips and benzoin.  The wound was then coated with bacitracin ointment.  A sterile dressing was applied.  The drapes were removed.  The patient was then returned to the supine position.  I then remove the Mayfield three-point head rest from his calvarium.  By report all sponge,  instrument, and needle counts were correct at the end of this case.

## 2018-06-20 NOTE — Plan of Care (Signed)
  Problem: Safety: Goal: Ability to remain free from injury will improve Outcome: Progressing   

## 2018-06-21 ENCOUNTER — Encounter (HOSPITAL_COMMUNITY): Payer: Self-pay | Admitting: Neurosurgery

## 2018-06-21 MED ORDER — DOCUSATE SODIUM 100 MG PO CAPS: 100.0000 mg | ORAL_CAPSULE | Freq: Two times a day (BID) | ORAL | 0 refills | Status: DC

## 2018-06-21 MED ORDER — OXYCODONE HCL 5 MG PO TABS: 5.0000 mg | ORAL_TABLET | ORAL | 0 refills | Status: DC | PRN

## 2018-06-21 MED FILL — Thrombin (Recombinant) For Soln 20000 Unit: CUTANEOUS | Qty: 1 | Status: AC

## 2018-06-21 NOTE — Anesthesia Postprocedure Evaluation (Signed)
Anesthesia Post Note  Patient: [name]  Procedure(s) Performed: POSTERIOR CERVICAL LAMINECTOMY, POSTERIOR INSTRUMENTATION AND FUSION CERVICAL THREE-FOUR, CERVICAL FOUR-FIVE (N/A )     Patient location during evaluation: PACU Anesthesia Type: General Level of consciousness: sedated and patient cooperative Pain management: pain level controlled Vital Signs Assessment: post-procedure vital signs reviewed and stable Respiratory status: spontaneous breathing Cardiovascular status: stable Anesthetic complications: no    Last Vitals:  Vitals:   06/21/18 0417 06/21/18 0739  BP: 110/66 130/73  Pulse: 76 77  Resp: 18 16  Temp: 36.9 C   SpO2: 98% 100%    Last Pain:  Vitals:   06/21/18 0530  TempSrc:   PainSc: [place]

## 2018-06-21 NOTE — Progress Notes (Signed)
Pt doing well. Pt and wife given D/C instructions with verbal understanding. Rx's were called into Pt's pharmacy by MD. Pt's incision is clean and dry with no sign of infection. Pt's IV was removed prior to D/C. Pt D/C'd home via wheelchair per MD order. Pt is stable @ D/C and has no other need at this time. Holli Humbles, RN

## 2018-06-21 NOTE — Discharge Summary (Signed)
Physician Discharge Summary  Patient ID: [name] MRN: [id number] DOB/AGE: August 14, 1946 72 y.o.  Admit date: 06/20/2018 Discharge date: 06/21/2018  Admission Diagnoses: Cervical spondylosis, cervical stenosis, cervical myelopathy, cervicalgia  Discharge Diagnoses: The same Active Problems:   Myelopathy concurrent with and due to spinal stenosis of cervical region [facility])   Discharged Condition: good  Hospital Course: I performed a C3-4 and C4-5 posterior decompression, instrumentation and fusion on the patient on 06/20/2018.  The surgery went well.  The patient's postoperative course was unremarkable.  On postoperative day #1 he requested discharge to home.  He was given oral and written discharge instructions.  All his questions were answered.  Consults: Occupational Therapy Significant Diagnostic Studies: None Treatments: C3-4 and C4-5 posterior decompression, instrumentation and fusion Discharge Exam: Blood pressure 130/73, pulse 77, temperature 98.5 F (36.9 C), temperature source Oral, resp. rate 16, height 6' (1.829 m), weight 80.3 kg, SpO2 100 %. The patient is alert and pleasant.  He looks well.  His strength is normal.  His dressing is dry.  Disposition: Home  Discharge Instructions    Call MD for:  difficulty breathing, headache or visual disturbances   Complete by:  As directed    Call MD for:  extreme fatigue   Complete by:  As directed    Call MD for:  hives   Complete by:  As directed    Call MD for:  persistant dizziness or light-headedness   Complete by:  As directed    Call MD for:  persistant nausea and vomiting   Complete by:  As directed    Call MD for:  redness, tenderness, or signs of infection (pain, swelling, redness, odor or green/yellow discharge around incision site)   Complete by:  As directed    Call MD for:  severe uncontrolled pain   Complete by:  As directed    Call MD for:  temperature >100.4   Complete by:  As directed    Diet - low  sodium heart healthy   Complete by:  As directed    Discharge instructions   Complete by:  As directed    Call [phone] for a followup appointment. Take a stool softener while you are using pain medications.   Driving Restrictions   Complete by:  As directed    Do not drive for 2 weeks.   Increase activity slowly   Complete by:  As directed    Lifting restrictions   Complete by:  As directed    Do not lift more than 5 pounds. No excessive bending or twisting.   May shower / Bathe   Complete by:  As directed    Remove the dressing for 3 days after surgery.  You may shower, but leave the incision alone.   Remove dressing in 48 hours   Complete by:  As directed    Your stitches are under the scan and will dissolve by themselves. The Steri-Strips will fall off after you take a few showers. Do not rub back or pick at the wound, Leave the wound alone.     Allergies as of 06/21/2018   No Known Allergies     Medication List    STOP taking these medications   naproxen 500 MG tablet Commonly known as:  NAPROSYN   PENNSAID 2 % Soln Generic drug:  Diclofenac Sodium   tizanidine 2 MG capsule Commonly known as:  ZANAFLEX   traMADol 50 MG tablet Commonly known as:  Veatrice Bourbon  TAKE these medications   ANTIOXIDANT FORMULA Tabs Take 1 tablet by mouth daily.   BIOFREEZE EX Apply 1 application topically daily as needed (muscle pain).   docusate sodium 100 MG capsule Commonly known as:  COLACE Take 1 capsule (100 mg total) by mouth 2 (two) times daily.   Magnesium 250 MG Tabs Take 250 mg by mouth 3 (three) times daily.   multivitamin with minerals Tabs tablet Take 1 tablet by mouth daily.   nitroGLYCERIN 0.4 MG SL tablet Commonly known as:  NITROSTAT Place 0.4 mg under the tongue every 5 (five) minutes as needed (esophageal spasms).   omeprazole 40 MG capsule Commonly known as:  PRILOSEC Take 40 mg by mouth daily.   OVER THE COUNTER MEDICATION Take 1 capsule by mouth  at bedtime as needed (sleep). Calms Forte otc supplement   oxyCODONE 5 MG immediate release tablet Commonly known as:  Oxy IR/ROXICODONE Take 1 tablet (5 mg total) by mouth every 4 (four) hours as needed for moderate pain ((score 4 to 6)).   pramipexole 1 MG tablet Commonly known as:  MIRAPEX Take 3 tablets by mouth at bedtime.   sildenafil 20 MG tablet Commonly known as:  REVATIO Take 40-60 mg by mouth daily as needed (erectile dysfunction).   TART CHERRY ADVANCED Caps Take 3 capsules by mouth every evening.   triamcinolone cream 0.1 % Commonly known as:  KENALOG Apply 1 application topically daily as needed (rash).   Vitamin D (Ergocalciferol) 50000 units Caps capsule Commonly known as:  DRISDOL Take 1 capsule (50,000 Units total) by mouth every 7 (seven) days.        Signed: Ophelia Charter 06/21/2018, 7:41 AM

## 2018-06-21 NOTE — Evaluation (Signed)
Occupational Therapy Evaluation Patient Details Name: [name] MRN: [id number] DOB: 07/10/1946 Today's Date: 06/21/2018    History of Present Illness 72 yo male s/p C3-4 and C4-5 posterior decompression.    Clinical Impression   PTA, pt was living with his wife and was independent. Currently, pt requires Min A for collar management and UB dressing and supervision for remaining ADLs and functional mobility. Provided education and handout on cervical precautions, collar management, UB ADLs, LB ADLs, toileting, and shower transfer with shower seat; pt demonstrated and verbalized understanding. Answered all pt questions. Recommend dc home once medically stable per physician. All acute OT needs met and will sign off. Thank you.     Follow Up Recommendations  No OT follow up;Supervision/Assistance - 24 hour    Equipment Recommendations  None recommended by OT    Recommendations for Other Services       Precautions / Restrictions Precautions Precautions: Cervical Precaution Booklet Issued: Yes (comment) Precaution Comments: Reveiwed cervical precautions and adherance during ADLs Required Braces or Orthoses: Cervical Brace Cervical Brace: Hard collar;At all times Restrictions Weight Bearing Restrictions: No      Mobility Bed Mobility Overal bed mobility: Needs Assistance Bed Mobility: Sidelying to Sit;Rolling Rolling: Supervision Sidelying to sit: Supervision       General bed mobility comments: education on log roll techniques. supervision for safety  Transfers Overall transfer level: Needs assistance Equipment used: None Transfers: Sit to/from Stand Sit to Stand: Supervision         General transfer comment: supervision for safety    Balance Overall balance assessment: No apparent balance deficits (not formally assessed)                                         ADL either performed or assessed with clinical judgement   ADL Overall  ADL's : Needs assistance/impaired Eating/Feeding: Set up;Supervision/ safety;Sitting   Grooming: Set up;Supervision/safety;Standing Grooming Details (indicate cue type and reason): Educating pt on bringing cup up to his mouth during oral care Upper Body Bathing: Set up;Supervision/ safety;Sitting   Lower Body Bathing: Set up;Supervison/ safety;Sit to/from stand Lower Body Bathing Details (indicate cue type and reason): Pt able to bring ankles to knees Upper Body Dressing : Minimal assistance;Sitting Upper Body Dressing Details (indicate cue type and reason): Min A for managing collar. Educating pt on donning/doffing collor, wear schedule, and changing pads. Pt and wife verbalized understanding. Educating pt on donning over head shirts and button ups. Pt requiring Min A due to left shoulder pain Lower Body Dressing: Set up;Supervision/safety;Sit to/from stand Lower Body Dressing Details (indicate cue type and reason): Pt donning underwear and pants Toilet Transfer: Supervision/safety;Regular Toilet;Ambulation           Functional mobility during ADLs: Supervision/safety General ADL Comments: Pt requiring supervision trhoguhout session for safety. Requiring increased assistance for UB dressing and collar management. Providing education on cervical precautions, collar management, bed mobility, UB ADLs, LB ADLs, toileting, and shower transfers.      Vision Baseline Vision/History: Wears glasses Patient Visual Report: No change from baseline       Perception     Praxis      Pertinent Vitals/Pain Pain Assessment: Faces Faces Pain Scale: Hurts little more Pain Location: Left shoulder and neck Pain Descriptors / Indicators: Discomfort;Grimacing;Guarding Pain Intervention(s): Monitored during session;Limited activity within patient's tolerance;Repositioned     Hand Dominance  Extremity/Trunk Assessment Upper Extremity Assessment Upper Extremity Assessment: LUE  deficits/detail LUE Deficits / Details: Soreness along left shoulder.    Lower Extremity Assessment Lower Extremity Assessment: Overall WFL for tasks assessed   Cervical / Trunk Assessment Cervical / Trunk Assessment: Other exceptions Cervical / Trunk Exceptions: s/p cervical sx   Communication Communication Communication: No difficulties   Cognition Arousal/Alertness: Awake/alert Behavior During Therapy: WFL for tasks assessed/performed Overall Cognitive Status: Within Functional Limits for tasks assessed                                     General Comments  Wife arriving at end of session and reviewed education    Exercises     Shoulder Instructions      Home Living Family/patient expects to be discharged to:: Private residence Living Arrangements: Spouse/significant other Available Help at Discharge: Family;Available 24 hours/day Type of Home: House Home Access: Stairs to enter     Home Layout: Two level;Bed/bath upstairs Alternate Level Stairs-Number of Steps: Flight Alternate Level Stairs-Rails: Left Bathroom Shower/Tub: Occupational psychologist: Standard     Home Equipment: Shower seat          Prior Functioning/Environment Level of Independence: Independent        Comments: Pt was performing ADLs and IADLs. However, pt reporting he was having to hold his head upright during ADLs and functional mobility        OT Problem List: Decreased activity tolerance;Decreased range of motion;Decreased knowledge of use of DME or AE;Decreased knowledge of precautions;Pain      OT Treatment/Interventions:      OT Goals(Current goals can be found in the care plan section) Acute Rehab OT Goals Patient Stated Goal: "Go home today" OT Goal Formulation: With patient  OT Frequency:     Barriers to D/C:            Co-evaluation              AM-PAC PT "6 Clicks" Daily Activity     Outcome Measure Help from another person eating  meals?: None Help from another person taking care of personal grooming?: None Help from another person toileting, which includes using toliet, bedpan, or urinal?: None Help from another person bathing (including washing, rinsing, drying)?: None Help from another person to put on and taking off regular upper body clothing?: A Little Help from another person to put on and taking off regular lower body clothing?: None 6 Click Score: 23   End of Session Equipment Utilized During Treatment: Cervical collar Nurse Communication: Mobility status;Precautions  Activity Tolerance: Patient tolerated treatment well;Patient limited by pain Patient left: in chair;with call bell/phone within reach;with family/visitor present  OT Visit Diagnosis: Unsteadiness on feet (R26.81);Other abnormalities of gait and mobility (R26.89);Muscle weakness (generalized) (M62.81);Pain Pain - Right/Left: Left Pain - part of body: Shoulder(Neck)                Time: 3846-6599 OT Time Calculation (min): 33 min Charges:  OT General Charges $OT Visit: 1 Visit OT Evaluation $OT Eval Low Complexity: 1 Low OT Treatments $Self Care/Home Management : 8-22 mins  Jabaree Mercado MSOT, OTR/L Acute Rehab Pager: [phone] Office: [place] 06/21/2018, 10:46 AM

## 2018-07-01 DIAGNOSIS — L821 Other seborrheic keratosis: Secondary | ICD-10-CM | POA: Diagnosis not present

## 2018-07-01 DIAGNOSIS — D229 Melanocytic nevi, unspecified: Secondary | ICD-10-CM | POA: Diagnosis not present

## 2018-07-01 DIAGNOSIS — L309 Dermatitis, unspecified: Secondary | ICD-10-CM | POA: Diagnosis not present

## 2018-07-01 DIAGNOSIS — D1801 Hemangioma of skin and subcutaneous tissue: Secondary | ICD-10-CM | POA: Diagnosis not present

## 2018-07-04 DIAGNOSIS — H524 Presbyopia: Secondary | ICD-10-CM | POA: Diagnosis not present

## 2018-07-15 DIAGNOSIS — M542 Cervicalgia: Secondary | ICD-10-CM | POA: Diagnosis not present

## 2018-07-27 DIAGNOSIS — D649 Anemia, unspecified: Secondary | ICD-10-CM | POA: Diagnosis not present

## 2018-07-27 DIAGNOSIS — Z79899 Other long term (current) drug therapy: Secondary | ICD-10-CM | POA: Diagnosis not present

## 2018-07-28 DIAGNOSIS — M179 Osteoarthritis of knee, unspecified: Secondary | ICD-10-CM | POA: Diagnosis not present

## 2018-07-28 DIAGNOSIS — G2581 Restless legs syndrome: Secondary | ICD-10-CM | POA: Diagnosis not present

## 2018-07-28 DIAGNOSIS — M1990 Unspecified osteoarthritis, unspecified site: Secondary | ICD-10-CM | POA: Diagnosis not present

## 2018-07-28 DIAGNOSIS — K219 Gastro-esophageal reflux disease without esophagitis: Secondary | ICD-10-CM | POA: Diagnosis not present

## 2018-07-28 DIAGNOSIS — G479 Sleep disorder, unspecified: Secondary | ICD-10-CM | POA: Diagnosis not present

## 2018-07-28 DIAGNOSIS — Z23 Encounter for immunization: Secondary | ICD-10-CM | POA: Diagnosis not present

## 2018-07-28 DIAGNOSIS — Z0000 Encounter for general adult medical examination without abnormal findings: Secondary | ICD-10-CM | POA: Diagnosis not present

## 2018-07-28 DIAGNOSIS — D509 Iron deficiency anemia, unspecified: Secondary | ICD-10-CM | POA: Diagnosis not present

## 2018-08-02 DIAGNOSIS — H6123 Impacted cerumen, bilateral: Secondary | ICD-10-CM | POA: Diagnosis not present

## 2018-10-21 DIAGNOSIS — M4802 Spinal stenosis, cervical region: Secondary | ICD-10-CM | POA: Diagnosis not present

## 2018-10-21 DIAGNOSIS — R030 Elevated blood-pressure reading, without diagnosis of hypertension: Secondary | ICD-10-CM | POA: Diagnosis not present

## 2018-10-21 DIAGNOSIS — M47812 Spondylosis without myelopathy or radiculopathy, cervical region: Secondary | ICD-10-CM | POA: Diagnosis not present

## 2018-10-28 DIAGNOSIS — G2581 Restless legs syndrome: Secondary | ICD-10-CM | POA: Diagnosis not present

## 2018-10-28 DIAGNOSIS — Z0000 Encounter for general adult medical examination without abnormal findings: Secondary | ICD-10-CM | POA: Diagnosis not present

## 2018-10-28 DIAGNOSIS — K219 Gastro-esophageal reflux disease without esophagitis: Secondary | ICD-10-CM | POA: Diagnosis not present

## 2018-10-28 DIAGNOSIS — D509 Iron deficiency anemia, unspecified: Secondary | ICD-10-CM | POA: Diagnosis not present

## 2018-10-28 DIAGNOSIS — M1990 Unspecified osteoarthritis, unspecified site: Secondary | ICD-10-CM | POA: Diagnosis not present

## 2018-10-28 DIAGNOSIS — G479 Sleep disorder, unspecified: Secondary | ICD-10-CM | POA: Diagnosis not present

## 2018-10-28 DIAGNOSIS — Z23 Encounter for immunization: Secondary | ICD-10-CM | POA: Diagnosis not present

## 2018-10-28 DIAGNOSIS — M179 Osteoarthritis of knee, unspecified: Secondary | ICD-10-CM | POA: Diagnosis not present

## 2018-11-28 DIAGNOSIS — R1310 Dysphagia, unspecified: Secondary | ICD-10-CM | POA: Diagnosis not present

## 2018-11-28 DIAGNOSIS — D509 Iron deficiency anemia, unspecified: Secondary | ICD-10-CM | POA: Diagnosis not present

## 2018-12-15 DIAGNOSIS — C61 Malignant neoplasm of prostate: Secondary | ICD-10-CM | POA: Diagnosis not present

## 2018-12-21 DIAGNOSIS — C61 Malignant neoplasm of prostate: Secondary | ICD-10-CM | POA: Diagnosis not present

## 2018-12-21 DIAGNOSIS — N5201 Erectile dysfunction due to arterial insufficiency: Secondary | ICD-10-CM | POA: Diagnosis not present

## 2018-12-21 DIAGNOSIS — N400 Enlarged prostate without lower urinary tract symptoms: Secondary | ICD-10-CM | POA: Diagnosis not present

## 2019-01-03 DIAGNOSIS — L247 Irritant contact dermatitis due to plants, except food: Secondary | ICD-10-CM | POA: Diagnosis not present

## 2019-01-23 DIAGNOSIS — D509 Iron deficiency anemia, unspecified: Secondary | ICD-10-CM | POA: Diagnosis not present

## 2019-01-27 DIAGNOSIS — D509 Iron deficiency anemia, unspecified: Secondary | ICD-10-CM | POA: Diagnosis not present

## 2019-01-27 DIAGNOSIS — R030 Elevated blood-pressure reading, without diagnosis of hypertension: Secondary | ICD-10-CM | POA: Diagnosis not present

## 2019-01-27 DIAGNOSIS — M542 Cervicalgia: Secondary | ICD-10-CM | POA: Diagnosis not present

## 2019-02-24 DIAGNOSIS — R42 Dizziness and giddiness: Secondary | ICD-10-CM | POA: Diagnosis not present

## 2019-04-13 DIAGNOSIS — M47812 Spondylosis without myelopathy or radiculopathy, cervical region: Secondary | ICD-10-CM | POA: Diagnosis not present

## 2019-04-13 DIAGNOSIS — M4802 Spinal stenosis, cervical region: Secondary | ICD-10-CM | POA: Diagnosis not present

## 2019-04-13 DIAGNOSIS — R030 Elevated blood-pressure reading, without diagnosis of hypertension: Secondary | ICD-10-CM | POA: Diagnosis not present

## 2019-04-13 DIAGNOSIS — Z6825 Body mass index (BMI) 25.0-25.9, adult: Secondary | ICD-10-CM | POA: Diagnosis not present

## 2019-04-17 DIAGNOSIS — Z1159 Encounter for screening for other viral diseases: Secondary | ICD-10-CM | POA: Diagnosis not present

## 2019-04-20 DIAGNOSIS — K5730 Diverticulosis of large intestine without perforation or abscess without bleeding: Secondary | ICD-10-CM | POA: Diagnosis not present

## 2019-04-20 DIAGNOSIS — D509 Iron deficiency anemia, unspecified: Secondary | ICD-10-CM | POA: Diagnosis not present

## 2019-04-20 DIAGNOSIS — Z8501 Personal history of malignant neoplasm of esophagus: Secondary | ICD-10-CM | POA: Diagnosis not present

## 2019-04-20 DIAGNOSIS — K2970 Gastritis, unspecified, without bleeding: Secondary | ICD-10-CM | POA: Diagnosis not present

## 2019-04-20 DIAGNOSIS — R1310 Dysphagia, unspecified: Secondary | ICD-10-CM | POA: Diagnosis not present

## 2019-04-25 DIAGNOSIS — D509 Iron deficiency anemia, unspecified: Secondary | ICD-10-CM | POA: Diagnosis not present

## 2019-06-05 DIAGNOSIS — M542 Cervicalgia: Secondary | ICD-10-CM | POA: Diagnosis not present

## 2019-06-12 DIAGNOSIS — M542 Cervicalgia: Secondary | ICD-10-CM | POA: Diagnosis not present

## 2019-06-19 DIAGNOSIS — M542 Cervicalgia: Secondary | ICD-10-CM | POA: Diagnosis not present

## 2019-06-22 DIAGNOSIS — C61 Malignant neoplasm of prostate: Secondary | ICD-10-CM | POA: Diagnosis not present

## 2019-06-29 DIAGNOSIS — N5201 Erectile dysfunction due to arterial insufficiency: Secondary | ICD-10-CM | POA: Diagnosis not present

## 2019-06-29 DIAGNOSIS — C61 Malignant neoplasm of prostate: Secondary | ICD-10-CM | POA: Diagnosis not present

## 2019-07-26 DIAGNOSIS — N5201 Erectile dysfunction due to arterial insufficiency: Secondary | ICD-10-CM | POA: Diagnosis not present

## 2019-07-26 DIAGNOSIS — C61 Malignant neoplasm of prostate: Secondary | ICD-10-CM | POA: Diagnosis not present

## 2019-08-03 DIAGNOSIS — R69 Illness, unspecified: Secondary | ICD-10-CM | POA: Diagnosis not present

## 2019-08-29 DIAGNOSIS — Z1159 Encounter for screening for other viral diseases: Secondary | ICD-10-CM | POA: Diagnosis not present

## 2019-08-29 DIAGNOSIS — K219 Gastro-esophageal reflux disease without esophagitis: Secondary | ICD-10-CM | POA: Diagnosis not present

## 2019-08-29 DIAGNOSIS — D509 Iron deficiency anemia, unspecified: Secondary | ICD-10-CM | POA: Diagnosis not present

## 2019-08-29 DIAGNOSIS — R42 Dizziness and giddiness: Secondary | ICD-10-CM | POA: Diagnosis not present

## 2019-08-29 DIAGNOSIS — M19049 Primary osteoarthritis, unspecified hand: Secondary | ICD-10-CM | POA: Diagnosis not present

## 2019-08-29 DIAGNOSIS — M179 Osteoarthritis of knee, unspecified: Secondary | ICD-10-CM | POA: Diagnosis not present

## 2019-08-29 DIAGNOSIS — G479 Sleep disorder, unspecified: Secondary | ICD-10-CM | POA: Diagnosis not present

## 2019-08-29 DIAGNOSIS — Z0000 Encounter for general adult medical examination without abnormal findings: Secondary | ICD-10-CM | POA: Diagnosis not present

## 2019-08-29 DIAGNOSIS — Z79899 Other long term (current) drug therapy: Secondary | ICD-10-CM | POA: Diagnosis not present

## 2019-09-05 DIAGNOSIS — N5201 Erectile dysfunction due to arterial insufficiency: Secondary | ICD-10-CM | POA: Diagnosis not present

## 2019-09-16 DIAGNOSIS — J988 Other specified respiratory disorders: Secondary | ICD-10-CM | POA: Diagnosis not present

## 2019-09-16 DIAGNOSIS — U071 COVID-19: Secondary | ICD-10-CM | POA: Diagnosis not present

## 2019-09-28 DIAGNOSIS — R69 Illness, unspecified: Secondary | ICD-10-CM | POA: Diagnosis not present

## 2019-10-06 DIAGNOSIS — M79645 Pain in left finger(s): Secondary | ICD-10-CM | POA: Diagnosis not present

## 2019-10-06 DIAGNOSIS — M65332 Trigger finger, left middle finger: Secondary | ICD-10-CM | POA: Diagnosis not present

## 2019-10-13 ENCOUNTER — Ambulatory Visit: Payer: Medicare HMO | Attending: Internal Medicine

## 2019-10-13 DIAGNOSIS — Z23 Encounter for immunization: Secondary | ICD-10-CM | POA: Insufficient documentation

## 2019-10-13 NOTE — Progress Notes (Signed)
   Covid-19 Vaccination Clinic  Name:  [name]    MRN: [id number] DOB: Mar 30, 1946  10/13/2019  [name] was observed post Covid-19 immunization for 15 minutes without incidence. He was provided with Vaccine Information Sheet and instruction to access the V-Safe system.   [name] was instructed to call 911 with any severe reactions post vaccine: [name] Difficulty breathing  . Swelling of your face and throat  . A fast heartbeat  . A bad rash all over your body  . Dizziness and weakness    Immunizations Administered    Name Date Dose VIS Date Route   Pfizer COVID-19 Vaccine 10/13/2019 11:27 AM 0.3 mL 09/01/2019 Intramuscular   Manufacturer: [place]   Lot: [id number]   [place]: [id number]

## 2019-10-16 DIAGNOSIS — R69 Illness, unspecified: Secondary | ICD-10-CM | POA: Diagnosis not present

## 2019-11-03 ENCOUNTER — Other Ambulatory Visit: Payer: Self-pay

## 2019-11-03 ENCOUNTER — Ambulatory Visit: Payer: Medicare HMO | Attending: Internal Medicine

## 2019-11-03 DIAGNOSIS — Z23 Encounter for immunization: Secondary | ICD-10-CM | POA: Insufficient documentation

## 2019-11-03 NOTE — Progress Notes (Signed)
   Covid-19 Vaccination Clinic  Name:  [name]    MRN: [id number] DOB: 1946-05-27  11/03/2019  [name] was observed post Covid-19 immunization for 15 minutes without incidence. He was provided with Vaccine Information Sheet and instruction to access the V-Safe system.   [name] was instructed to call 911 with any severe reactions post vaccine: [name] Difficulty breathing  . Swelling of your face and throat  . A fast heartbeat  . A bad rash all over your body  . Dizziness and weakness    Immunizations Administered    Name Date Dose VIS Date Route   Pfizer COVID-19 Vaccine 11/03/2019  4:23 PM 0.3 mL 09/01/2019 Intramuscular   Manufacturer: [place]   Lot: [id number]   [place]: [id number]

## 2019-11-07 DIAGNOSIS — M65332 Trigger finger, left middle finger: Secondary | ICD-10-CM | POA: Diagnosis not present

## 2019-12-21 DIAGNOSIS — C61 Malignant neoplasm of prostate: Secondary | ICD-10-CM | POA: Diagnosis not present

## 2020-01-16 DIAGNOSIS — C61 Malignant neoplasm of prostate: Secondary | ICD-10-CM | POA: Diagnosis not present

## 2020-01-16 DIAGNOSIS — N5201 Erectile dysfunction due to arterial insufficiency: Secondary | ICD-10-CM | POA: Diagnosis not present

## 2020-02-05 DIAGNOSIS — M19049 Primary osteoarthritis, unspecified hand: Secondary | ICD-10-CM | POA: Diagnosis not present

## 2020-02-05 DIAGNOSIS — C61 Malignant neoplasm of prostate: Secondary | ICD-10-CM | POA: Diagnosis not present

## 2020-02-05 DIAGNOSIS — D509 Iron deficiency anemia, unspecified: Secondary | ICD-10-CM | POA: Diagnosis not present

## 2020-02-05 DIAGNOSIS — M179 Osteoarthritis of knee, unspecified: Secondary | ICD-10-CM | POA: Diagnosis not present

## 2020-04-03 DIAGNOSIS — M65332 Trigger finger, left middle finger: Secondary | ICD-10-CM | POA: Diagnosis not present

## 2020-04-10 DIAGNOSIS — R1310 Dysphagia, unspecified: Secondary | ICD-10-CM | POA: Diagnosis not present

## 2020-04-10 DIAGNOSIS — K222 Esophageal obstruction: Secondary | ICD-10-CM | POA: Diagnosis not present

## 2020-04-17 DIAGNOSIS — L570 Actinic keratosis: Secondary | ICD-10-CM | POA: Diagnosis not present

## 2020-04-17 DIAGNOSIS — D1801 Hemangioma of skin and subcutaneous tissue: Secondary | ICD-10-CM | POA: Diagnosis not present

## 2020-04-17 DIAGNOSIS — L821 Other seborrheic keratosis: Secondary | ICD-10-CM | POA: Diagnosis not present

## 2020-04-17 DIAGNOSIS — D229 Melanocytic nevi, unspecified: Secondary | ICD-10-CM | POA: Diagnosis not present

## 2020-04-17 DIAGNOSIS — L814 Other melanin hyperpigmentation: Secondary | ICD-10-CM | POA: Diagnosis not present

## 2020-04-17 DIAGNOSIS — L819 Disorder of pigmentation, unspecified: Secondary | ICD-10-CM | POA: Diagnosis not present

## 2020-04-17 DIAGNOSIS — B0089 Other herpesviral infection: Secondary | ICD-10-CM | POA: Diagnosis not present

## 2020-04-23 DIAGNOSIS — Z87891 Personal history of nicotine dependence: Secondary | ICD-10-CM | POA: Diagnosis not present

## 2020-04-23 DIAGNOSIS — Z8501 Personal history of malignant neoplasm of esophagus: Secondary | ICD-10-CM | POA: Diagnosis not present

## 2020-04-23 DIAGNOSIS — K219 Gastro-esophageal reflux disease without esophagitis: Secondary | ICD-10-CM | POA: Diagnosis not present

## 2020-04-23 DIAGNOSIS — K222 Esophageal obstruction: Secondary | ICD-10-CM | POA: Diagnosis not present

## 2020-04-23 DIAGNOSIS — R1310 Dysphagia, unspecified: Secondary | ICD-10-CM | POA: Diagnosis not present

## 2020-04-23 DIAGNOSIS — R1314 Dysphagia, pharyngoesophageal phase: Secondary | ICD-10-CM | POA: Diagnosis not present

## 2020-05-16 DIAGNOSIS — M179 Osteoarthritis of knee, unspecified: Secondary | ICD-10-CM | POA: Diagnosis not present

## 2020-05-16 DIAGNOSIS — D509 Iron deficiency anemia, unspecified: Secondary | ICD-10-CM | POA: Diagnosis not present

## 2020-05-16 DIAGNOSIS — C61 Malignant neoplasm of prostate: Secondary | ICD-10-CM | POA: Diagnosis not present

## 2020-05-16 DIAGNOSIS — M19049 Primary osteoarthritis, unspecified hand: Secondary | ICD-10-CM | POA: Diagnosis not present

## 2020-06-30 DIAGNOSIS — Z20822 Contact with and (suspected) exposure to covid-19: Secondary | ICD-10-CM | POA: Diagnosis not present

## 2020-07-26 DIAGNOSIS — C61 Malignant neoplasm of prostate: Secondary | ICD-10-CM | POA: Diagnosis not present

## 2020-07-26 DIAGNOSIS — K219 Gastro-esophageal reflux disease without esophagitis: Secondary | ICD-10-CM | POA: Diagnosis not present

## 2020-07-26 DIAGNOSIS — M19049 Primary osteoarthritis, unspecified hand: Secondary | ICD-10-CM | POA: Diagnosis not present

## 2020-07-26 DIAGNOSIS — M179 Osteoarthritis of knee, unspecified: Secondary | ICD-10-CM | POA: Diagnosis not present

## 2020-07-26 DIAGNOSIS — D509 Iron deficiency anemia, unspecified: Secondary | ICD-10-CM | POA: Diagnosis not present

## 2020-08-07 DIAGNOSIS — R69 Illness, unspecified: Secondary | ICD-10-CM | POA: Diagnosis not present

## 2020-09-04 DIAGNOSIS — Z0000 Encounter for general adult medical examination without abnormal findings: Secondary | ICD-10-CM | POA: Diagnosis not present

## 2020-09-04 DIAGNOSIS — Z23 Encounter for immunization: Secondary | ICD-10-CM | POA: Diagnosis not present

## 2020-09-04 DIAGNOSIS — C61 Malignant neoplasm of prostate: Secondary | ICD-10-CM | POA: Diagnosis not present

## 2020-09-04 DIAGNOSIS — R42 Dizziness and giddiness: Secondary | ICD-10-CM | POA: Diagnosis not present

## 2020-09-04 DIAGNOSIS — Z79899 Other long term (current) drug therapy: Secondary | ICD-10-CM | POA: Diagnosis not present

## 2020-09-04 DIAGNOSIS — R011 Cardiac murmur, unspecified: Secondary | ICD-10-CM | POA: Diagnosis not present

## 2020-09-04 DIAGNOSIS — Z87891 Personal history of nicotine dependence: Secondary | ICD-10-CM | POA: Diagnosis not present

## 2020-09-04 DIAGNOSIS — D509 Iron deficiency anemia, unspecified: Secondary | ICD-10-CM | POA: Diagnosis not present

## 2020-09-04 DIAGNOSIS — M542 Cervicalgia: Secondary | ICD-10-CM | POA: Diagnosis not present

## 2020-09-04 DIAGNOSIS — G2581 Restless legs syndrome: Secondary | ICD-10-CM | POA: Diagnosis not present

## 2020-09-10 DIAGNOSIS — R262 Difficulty in walking, not elsewhere classified: Secondary | ICD-10-CM | POA: Diagnosis not present

## 2020-09-10 DIAGNOSIS — R42 Dizziness and giddiness: Secondary | ICD-10-CM | POA: Diagnosis not present

## 2020-09-16 DIAGNOSIS — R42 Dizziness and giddiness: Secondary | ICD-10-CM | POA: Diagnosis not present

## 2020-09-16 DIAGNOSIS — R262 Difficulty in walking, not elsewhere classified: Secondary | ICD-10-CM | POA: Diagnosis not present

## 2020-09-23 DIAGNOSIS — R262 Difficulty in walking, not elsewhere classified: Secondary | ICD-10-CM | POA: Diagnosis not present

## 2020-09-23 DIAGNOSIS — R42 Dizziness and giddiness: Secondary | ICD-10-CM | POA: Diagnosis not present

## 2020-09-26 DIAGNOSIS — R262 Difficulty in walking, not elsewhere classified: Secondary | ICD-10-CM | POA: Diagnosis not present

## 2020-09-26 DIAGNOSIS — R42 Dizziness and giddiness: Secondary | ICD-10-CM | POA: Diagnosis not present

## 2020-09-30 DIAGNOSIS — R42 Dizziness and giddiness: Secondary | ICD-10-CM | POA: Diagnosis not present

## 2020-09-30 DIAGNOSIS — R262 Difficulty in walking, not elsewhere classified: Secondary | ICD-10-CM | POA: Diagnosis not present

## 2020-10-02 ENCOUNTER — Other Ambulatory Visit: Payer: Self-pay | Admitting: *Deleted

## 2020-10-02 DIAGNOSIS — Z87891 Personal history of nicotine dependence: Secondary | ICD-10-CM

## 2020-10-03 DIAGNOSIS — R42 Dizziness and giddiness: Secondary | ICD-10-CM | POA: Diagnosis not present

## 2020-10-03 DIAGNOSIS — R262 Difficulty in walking, not elsewhere classified: Secondary | ICD-10-CM | POA: Diagnosis not present

## 2020-10-04 DIAGNOSIS — R2689 Other abnormalities of gait and mobility: Secondary | ICD-10-CM | POA: Diagnosis not present

## 2020-10-04 DIAGNOSIS — N289 Disorder of kidney and ureter, unspecified: Secondary | ICD-10-CM | POA: Diagnosis not present

## 2020-10-04 DIAGNOSIS — I10 Essential (primary) hypertension: Secondary | ICD-10-CM | POA: Diagnosis not present

## 2020-10-10 DIAGNOSIS — R262 Difficulty in walking, not elsewhere classified: Secondary | ICD-10-CM | POA: Diagnosis not present

## 2020-10-10 DIAGNOSIS — R42 Dizziness and giddiness: Secondary | ICD-10-CM | POA: Diagnosis not present

## 2020-10-11 DIAGNOSIS — Z961 Presence of intraocular lens: Secondary | ICD-10-CM | POA: Diagnosis not present

## 2020-10-11 DIAGNOSIS — H524 Presbyopia: Secondary | ICD-10-CM | POA: Diagnosis not present

## 2020-10-14 ENCOUNTER — Ambulatory Visit: Payer: Medicare HMO | Admitting: Cardiology

## 2020-10-21 DIAGNOSIS — L218 Other seborrheic dermatitis: Secondary | ICD-10-CM | POA: Diagnosis not present

## 2020-10-21 DIAGNOSIS — M179 Osteoarthritis of knee, unspecified: Secondary | ICD-10-CM | POA: Diagnosis not present

## 2020-10-21 DIAGNOSIS — I10 Essential (primary) hypertension: Secondary | ICD-10-CM | POA: Diagnosis not present

## 2020-10-21 DIAGNOSIS — K219 Gastro-esophageal reflux disease without esophagitis: Secondary | ICD-10-CM | POA: Diagnosis not present

## 2020-10-21 DIAGNOSIS — M19049 Primary osteoarthritis, unspecified hand: Secondary | ICD-10-CM | POA: Diagnosis not present

## 2020-10-21 DIAGNOSIS — D509 Iron deficiency anemia, unspecified: Secondary | ICD-10-CM | POA: Diagnosis not present

## 2020-10-21 DIAGNOSIS — C61 Malignant neoplasm of prostate: Secondary | ICD-10-CM | POA: Diagnosis not present

## 2020-10-21 DIAGNOSIS — L570 Actinic keratosis: Secondary | ICD-10-CM | POA: Diagnosis not present

## 2020-10-21 DIAGNOSIS — L720 Epidermal cyst: Secondary | ICD-10-CM | POA: Diagnosis not present

## 2020-10-21 DIAGNOSIS — L819 Disorder of pigmentation, unspecified: Secondary | ICD-10-CM | POA: Diagnosis not present

## 2020-10-22 DIAGNOSIS — M65332 Trigger finger, left middle finger: Secondary | ICD-10-CM | POA: Diagnosis not present

## 2020-10-31 DIAGNOSIS — R2689 Other abnormalities of gait and mobility: Secondary | ICD-10-CM | POA: Diagnosis not present

## 2020-10-31 DIAGNOSIS — N289 Disorder of kidney and ureter, unspecified: Secondary | ICD-10-CM | POA: Diagnosis not present

## 2020-10-31 DIAGNOSIS — I10 Essential (primary) hypertension: Secondary | ICD-10-CM | POA: Diagnosis not present

## 2020-11-04 ENCOUNTER — Encounter: Payer: Self-pay | Admitting: Cardiology

## 2020-11-04 ENCOUNTER — Ambulatory Visit: Payer: Medicare HMO | Admitting: Cardiology

## 2020-11-04 ENCOUNTER — Other Ambulatory Visit: Payer: Self-pay

## 2020-11-04 VITALS — BP 130/80 | HR 79 | Ht 72.0 in | Wt 186.0 lb

## 2020-11-04 DIAGNOSIS — R011 Cardiac murmur, unspecified: Secondary | ICD-10-CM

## 2020-11-04 DIAGNOSIS — I4510 Unspecified right bundle-branch block: Secondary | ICD-10-CM

## 2020-11-04 NOTE — Patient Instructions (Signed)
Medication Instructions:  The current medical regimen is effective;  continue present plan and medications.  *If you need a refill on your cardiac medications before your next appointment, please call your pharmacy*  Testing/Procedures: Your physician has requested that you have an echocardiogram. Echocardiography is a painless test that uses sound waves to create images of your heart. It provides your doctor with information about the size and shape of your heart and how well your heart's chambers and valves are working. This procedure takes approximately one hour. There are no restrictions for this procedure.  Follow-Up: At [facility], you and your health needs are our priority.  As part of our continuing mission to provide you with exceptional heart care, we have created designated Provider Care Teams.  These Care Teams include your primary Cardiologist (physician) and Advanced Practice Providers (APPs -  Physician Assistants and Nurse Practitioners) who all work together to provide you with the care you need, when you need it.  We recommend signing up for the patient portal called "MyChart".  Sign up information is provided on this After Visit Summary.  MyChart is used to connect with patients for Virtual Visits (Telemedicine).  Patients are able to view lab/test results, encounter notes, upcoming appointments, etc.  Non-urgent messages can be sent to your provider as well.   To learn more about what you can do with MyChart, go to [email or web address].    Your next appointment:   12 month(s)  The format for your next appointment:   In Person  Provider:   Mark Skains, MD   Thank you for choosing [facility]!!      

## 2020-11-04 NOTE — Progress Notes (Signed)
Cardiology Office Note:    Date:  11/04/2020   ID:  [name], DOB Feb 18, 1946, MRN [id number]  PCP:  Lawerance Cruel, [place]  Cardiologist:  Candee Furbish, MD  Advanced Practice Provider:  No care team member to display Electrophysiologist:  None       Referring MD: Lawerance Cruel, MD     History of Present Illness:    [name] is a 75 y.o. male here for the evaluation of murmur at the request of Dr. [name].  Overall he is not been feeling any symptoms, no chest pain, no shortness of breath no syncope no bleeding no fevers no chills.  Doing fairly well.  Has prior history of prostate cancer, adenocarcinoma of the GE junction followed by Dr. Paulita Fujita, left vocal cord paralysis.  Overall has been doing fairly well.  Occasionally will have vertigo or disequilibrium.  Sometimes takes Dramamine and Home Epley maneuver.  Occasional neck pain.  Prior to his esophageal cancer surgery, by Dr. [name], he saw me for preoperative evaluation.  Everything checked out well.  This was several years ago.  His father died at age 103 from myocardial infarction.  He quit smoking in 2011 after 30 pack years.  During a physical exam, Dr. Harrington Challenger evaluated murmur and it had the suggestion of louder intensity.  Creatinine 1.22 potassium 4.6 sodium 143 ALT 24 hemoglobin 14.0 HDL 81 LDL 83  Past Medical History:  Diagnosis Date  . Abdominal discomfort   . Dysrhythmia    occ pac  . Esophageal cancer ([place])    and prostate  . GERD (gastroesophageal reflux disease)   . Hoarseness   . Wears hearing aid     Past Surgical History:  Procedure Laterality Date  . DIRECT LARYNGOSCOPY WITH RADIAESSE INJECTION  02/08/2012   Procedure: DIRECT LARYNGOSCOPY WITH RADIAESSE INJECTION;  Surgeon: Jodi Marble, MD;  Location: [place];  Service: ENT;  Laterality: Bilateral;  microdirect-laryngosocopy  with bilateral vocal cord radiesse  injection  . DIRECT LARYNGOSCOPY WITH RADIAESSE INJECTION  03/21/2012   Procedure: DIRECT LARYNGOSCOPY WITH RADIAESSE INJECTION;  Surgeon: Jodi Marble, MD;  Location: [place];  Service: ENT;  Laterality: N/A;  microdirect laryngoscopy with radiaesse injection left vocal cord  . DIRECT LARYNGOSCOPY WITH RADIAESSE INJECTION N/A 01/23/2013   Procedure: DIRECT LARYNGOSCOPY WITH RADIESSE VOCAL CORD AUGMENTATION LARYNGOPLASTY;  Surgeon: Jodi Marble, MD;  Location: [place];  Service: ENT;  Laterality: N/A;  . ESOPHAGECTOMY  10/02/2010   Dr.Burney  . ESOPHAGOGASTRODUODENOSCOPY N/A 09/24/2015   Procedure: ESOPHAGOGASTRODUODENOSCOPY (EGD);  Surgeon: Teena Irani, MD;  Location: [facility];  Service: Endoscopy;  Laterality: N/A;  . ESOPHAGOGASTRODUODENOSCOPY N/A 12/12/2015   Procedure: ESOPHAGOGASTRODUODENOSCOPY (EGD);  Surgeon: Clarene Essex, MD;  Location: [facility];  Service: Endoscopy;  Laterality: N/A;  . ESOPHAGOSCOPY  03/21/2012   Procedure: ESOPHAGOSCOPY;  Surgeon: Jodi Marble, MD;  Location: [place];  Service: ENT;  Laterality: N/A;  . JEJUNOSTOMY  09/29/2010   Dr.Burney  . LARYNGOPLASTY  2016  . MICROLARYNGOSCOPY W/VOCAL CORD INJECTION  08/01/2012   Procedure: MICROLARYNGOSCOPY WITH VOCAL CORD INJECTION;  Surgeon: Jodi Marble, MD;  Location: [place];  Service: ENT;  Laterality: Left;  MICRO DIRECT LARYNGOSCOPY  AND RADIESSE  INJECTION   . MICROLARYNGOSCOPY W/VOCAL CORD INJECTION N/A 08/07/2013   Procedure: MICROLARYNGOSCOPY WITH RADIESSE VOCAL CORD AUGMENTATION ;  Surgeon: Jodi Marble, MD;  Location: [facility];  Service: ENT;  Laterality: N/A;  . MICROLARYNGOSCOPY W/VOCAL CORD INJECTION N/A 02/19/2014   Procedure: MICROLARYNGOSCOPY WITH BILATERAL RADIESSE  VOCAL CORD AUGMENTATION;  Surgeon: Jodi Marble, MD;  Location: [place];  Service: ENT;  Laterality: N/A;  . POSTERIOR CERVICAL  FUSION/FORAMINOTOMY N/A 06/20/2018   Procedure: POSTERIOR CERVICAL LAMINECTOMY, POSTERIOR INSTRUMENTATION AND FUSION CERVICAL THREE-FOUR, CERVICAL FOUR-FIVE;  Surgeon: Newman Pies, MD;  Location: [place];  Service: Neurosurgery;  Laterality: N/A;  POSTERIOR CERVICAL LAMINECTOMY, POSTERIOR INSTRUMENTATION AND FUSION CERVICAL THREE-FOUR, CERVICAL FOUR-FIVE  . PYLOROPLASTY  09/29/2010   Dr.Burney    Current Medications: Current Meds  Medication Sig  . Magnesium 250 MG TABS Take 250 mg by mouth 3 (three) times daily.  . Menthol, Topical Analgesic, (BIOFREEZE EX) Apply 1 application topically daily as needed (muscle pain).  . Multiple Vitamin (MULTIVITAMIN WITH MINERALS) TABS tablet Take 1 tablet by mouth daily.  . Multiple Vitamins-Minerals (ANTIOXIDANT FORMULA) TABS Take 1 tablet by mouth daily.  [name] omeprazole (PRILOSEC) 40 MG capsule Take 40 mg by mouth daily.   . pramipexole (MIRAPEX) 1 MG tablet Take 3 tablets by mouth at bedtime.  . sildenafil (REVATIO) 20 MG tablet Take 40-60 mg by mouth daily as needed (erectile dysfunction).  . triamcinolone cream (KENALOG) 0.1 % Apply 1 application topically daily as needed (rash).     Allergies:   Patient has no known allergies.   Social History   Socioeconomic History  . Marital status: Married    Spouse name: Not on file  . Number of children: Not on file  . Years of education: Not on file  . Highest education level: Not on file  Occupational History  . Not on file  Tobacco Use  . Smoking status: Former Smoker    Quit date: 06/13/2009    Years since quitting: 11.4  . Smokeless tobacco: Never Used  Substance and Sexual Activity  . Alcohol use: Yes    Alcohol/week: 10.0 standard drinks    Types: 10 Standard drinks or equivalent per week    Comment: avg 10 Jack Daniels/week  . Drug use: No  . Sexual activity: Not on file  Other Topics Concern  . Not on file  Social History Narrative  . Not on file   Social Determinants of Health    Financial Resource Strain: Not on file  Food Insecurity: Not on file  Transportation Needs: Not on file  Physical Activity: Not on file  Stress: Not on file  Social Connections: Not on file     Family History: The patient's family history includes Heart attack in his father.  ROS:   Please see the history of present illness.     All other systems reviewed and are negative.  EKGs/Labs/Other Studies Reviewed:    The following studies were reviewed today: Prior office records reviewed, lab work reviewed, EKG reviewed  EKG:  EKG is  ordered today.  The ekg ordered today demonstrates sinus rhythm 79 with right bundle branch block  Recent Labs: No results found for requested labs within last 8760 hours.  Recent Lipid Panel No results found for: CHOL, TRIG, HDL, CHOLHDL, VLDL, LDLCALC, LDLDIRECT   Risk Assessment/Calculations:      Physical Exam:    VS:  BP 130/80 (BP Location: Left Arm, Patient Position: Sitting, Cuff Size: Normal)   Pulse 79   Ht 6' (1.829 m)   Wt 186 lb (84.4 kg)   SpO2 98%   BMI 25.23 kg/m  Wt Readings from Last 3 Encounters:  11/04/20 186 lb (84.4 kg)  06/20/18 177 lb (80.3 kg)  06/15/18 176 lb 1.6 oz (79.9 kg)     GEN:  Well nourished, well developed in no acute distress HEENT: Normal NECK: No JVD; No carotid bruits LYMPHATICS: No lymphadenopathy CARDIAC: RRR, 2/6 holosystolic murmur heard best at apex no rubs, gallops RESPIRATORY:  Clear to auscultation without rales, wheezing or rhonchi  ABDOMEN: Soft, non-tender, non-distended MUSCULOSKELETAL:  No edema; No deformity  SKIN: Warm and dry NEUROLOGIC:  Alert and oriented x 3 PSYCHIATRIC:  Normal affect   ASSESSMENT:    1. Murmur, cardiac   2. RBBB    PLAN:    In order of problems listed above:  Heart murmur -Possible mitral regurgitation.  We will go ahead and check an echocardiogram to ensure anatomy.  No symptoms.  No significant shortness of breath.  No chest  pain.  Right bundle branch block -Noted on ECG.  Should be of no clinical significance.  No high risk symptoms such as syncope.  Esophageal cancer -Stable.  Doing well.  Continues to be monitored by Dr. Paulita Fujita.       Medication Adjustments/Labs and Tests Ordered: Current medicines are reviewed at length with the patient today.  Concerns regarding medicines are outlined above.  Orders Placed This Encounter  Procedures  . EKG 12-Lead  . ECHOCARDIOGRAM COMPLETE   No orders of the defined types were placed in this encounter.   Patient Instructions  Medication Instructions:  The current medical regimen is effective;  continue present plan and medications.  *If you need a refill on your cardiac medications before your next appointment, please call your pharmacy*  Testing/Procedures: Your physician has requested that you have an echocardiogram. Echocardiography is a painless test that uses sound waves to create images of your heart. It provides your doctor with information about the size and shape of your heart and how well your heart's chambers and valves are working. This procedure takes approximately one hour. There are no restrictions for this procedure.  Follow-Up: At [facility], you and your health needs are our priority.  As part of our continuing mission to provide you with exceptional heart care, we have created designated Provider Care Teams.  These Care Teams include your primary Cardiologist (physician) and Advanced Practice Providers (APPs -  Physician Assistants and Nurse Practitioners) who all work together to provide you with the care you need, when you need it.  We recommend signing up for the patient portal called "MyChart".  Sign up information is provided on this After Visit Summary.  MyChart is used to connect with patients for Virtual Visits (Telemedicine).  Patients are able to view lab/test results, encounter notes, upcoming appointments, etc.  Non-urgent  messages can be sent to your provider as well.   To learn more about what you can do with MyChart, go to [email or web address].    Your next appointment:   12 month(s)  The format for your next appointment:   In Person  Provider:   Candee Furbish, MD  Thank you for choosing [facility]!!        Signed, Candee Furbish, MD  11/04/2020 9:54 AM    [place]

## 2020-11-11 ENCOUNTER — Other Ambulatory Visit: Payer: Self-pay

## 2020-11-11 ENCOUNTER — Ambulatory Visit: Payer: Medicare HMO | Admitting: Acute Care

## 2020-11-11 ENCOUNTER — Ambulatory Visit (INDEPENDENT_AMBULATORY_CARE_PROVIDER_SITE_OTHER)
Admission: RE | Admit: 2020-11-11 | Discharge: 2020-11-11 | Disposition: A | Discharge status: Home / Self Care | Payer: Medicare HMO | Source: Ambulatory Visit | Attending: Cardiology | Admitting: Cardiology

## 2020-11-11 ENCOUNTER — Encounter: Payer: Self-pay | Admitting: Acute Care

## 2020-11-11 VITALS — BP 130/62 | HR 92 | Temp 97.8°F | Ht 72.0 in | Wt 188.2 lb

## 2020-11-11 DIAGNOSIS — Z87891 Personal history of nicotine dependence: Secondary | ICD-10-CM

## 2020-11-11 DIAGNOSIS — Z122 Encounter for screening for malignant neoplasm of respiratory organs: Secondary | ICD-10-CM

## 2020-11-11 NOTE — Patient Instructions (Signed)
Thank you for participating in the [facility]. It was our pleasure to meet you today. We will call you with the results of your scan within the next few days. Your scan will be assigned a Lung RADS category score by the physicians reading the scans.  This Lung RADS score determines follow up scanning.  See below for description of categories, and follow up screening recommendations. We will be in touch to schedule your follow up screening annually or based on recommendations of our providers. We will fax a copy of your scan results to your Primary Care Physician, or the physician who referred you to the program, to ensure they have the results. Please call the office if you have any questions or concerns regarding your scanning experience or results.  Our office number is [phone]. Please speak with Denise Phelps, RN. She is our Lung Cancer Screening RN. If she is unavailable when you call, please have the office staff send her a message. She will return your call at her earliest convenience. Remember, if your scan is normal, we will scan you annually as long as you continue to meet the criteria for the program. (Age 55-77, Current smoker or smoker who has quit within the last 15 years). If you are a smoker, remember, quitting is the single most powerful action that you can take to decrease your risk of lung cancer and other pulmonary, breathing related problems. We know quitting is hard, and we are here to help.  Please let us know if there is anything we can do to help you meet your goal of quitting. If you are a former smoker, congratulations. We are proud of you! Remain smoke free! Remember you can refer friends or family members through the number above.  We will screen them to make sure they meet criteria for the program. Thank you for helping us take better care of you by participating in Lung Screening.  Lung RADS Categories:  Lung RADS 1: no nodules  or definitely non-concerning nodules.  Recommendation is for a repeat annual scan in 12 months.  Lung RADS 2:  nodules that are non-concerning in appearance and behavior with a very low likelihood of becoming an active cancer. Recommendation is for a repeat annual scan in 12 months.  Lung RADS 3: nodules that are probably non-concerning , includes nodules with a low likelihood of becoming an active cancer.  Recommendation is for a 6-month repeat screening scan. Often noted after an upper respiratory illness. We will be in touch to make sure you have no questions, and to schedule your 6-month scan.  Lung RADS 4 A: nodules with concerning findings, recommendation is most often for a follow up scan in 3 months or additional testing based on our provider's assessment of the scan. We will be in touch to make sure you have no questions and to schedule the recommended 3 month follow up scan.  Lung RADS 4 B:  indicates findings that are concerning. We will be in touch with you to schedule additional diagnostic testing based on our provider's  assessment of the scan.   

## 2020-11-11 NOTE — Progress Notes (Signed)
Shared Decision Making Visit Lung Cancer Screening Program [phone])   Eligibility:  Age 75 y.o.  Pack Years Smoking History Calculation 35 pack year smoking history (# packs/per year x # years smoked)  Recent History of coughing up blood  no  Unexplained weight loss? no ( >Than 15 pounds within the last 6 months )  Prior History Lung / other cancer no (Diagnosis within the last 5 years already requiring surveillance chest CT Scans).  Smoking Status Former Smoker  Former Smokers: Years since quit: 11 years  Quit Date: 06/13/2009  Visit Components:  Discussion included one or more decision making aids. yes  Discussion included risk/benefits of screening. yes  Discussion included potential follow up diagnostic testing for abnormal scans. yes  Discussion included meaning and risk of over diagnosis. yes  Discussion included meaning and risk of False Positives. yes  Discussion included meaning of total radiation exposure. yes  Counseling Included:  Importance of adherence to annual lung cancer LDCT screening. yes  Impact of comorbidities on ability to participate in the program. yes  Ability and willingness to under diagnostic treatment. yes  Smoking Cessation Counseling:  Current Smokers:   Discussed importance of smoking cessation. yes  Information about tobacco cessation classes and interventions provided to patient. yes  Patient provided with "ticket" for LDCT Scan. yes  Symptomatic Patient. no  Counseling  Diagnosis Code: Tobacco Use Z72.0  Asymptomatic Patient yes  Counseling (Intermediate counseling: > three minutes counseling) J6283  Former Smokers:   Discussed the importance of maintaining cigarette abstinence. yes  Diagnosis Code: Personal History of Nicotine Dependence. T51.761  Information about tobacco cessation classes and interventions provided to patient. Yes  Patient provided with "ticket" for LDCT Scan. yes  Written Order for Lung  Cancer Screening with LDCT placed in Epic. Yes (CT Chest Lung Cancer Screening Low Dose W/O CM) [id number] Z12.2-Screening of respiratory organs Z87.891-Personal history of nicotine dependence  BP 130/62 (BP Location: Left Arm, Cuff Size: Normal)   Pulse 92   Temp 97.8 F (36.6 C) (Oral)   Ht 6' (1.829 m)   Wt 188 lb 3.2 oz (85.4 kg)   SpO2 98%   BMI 25.52 kg/m    I spent 25 minutes of face to face time with Mr. Krantz discussing the risks and benefits of lung cancer screening. We viewed a power point together that explained in detail the above noted topics. We took the time to pause the power point at intervals to allow for questions to be asked and answered to ensure understanding. We discussed that he had taken the single most powerful action possible to decrease his risk of developing lung cancer when he quit smoking. I counseled him to remain smoke free, and to contact me if he ever had the desire to smoke again so that I can provide resources and tools to help support the effort to remain smoke free. We discussed the time and location of the scan, and that either  Doroteo Glassman RN or I will call with the results within  24-48 hours of receiving them. He has my card and contact information in the event he needs to speak with me, in addition to a copy of the power point we reviewed as a resource. He verbalized understanding of all of the above and had no further questions upon leaving the office.     I explained to the patient that there has been a high incidence of coronary artery disease noted on these exams. I explained that  this is a non-gated exam therefore degree or severity cannot be determined. This patient is not on statin therapy. I have asked the patient to follow-up with their PCP regarding any incidental finding of coronary artery disease and management with diet or medication as they feel is clinically indicated. The patient verbalized understanding of the above and had no  further questions.     Magdalen Spatz, NP 11/11/2020 2:36 PM

## 2020-11-13 NOTE — Progress Notes (Signed)
Please call patient and let them  know their  low dose Ct was read as a Lung RADS 2: nodules that are benign in appearance and behavior with a very low likelihood of becoming a clinically active cancer due to size or lack of growth. Recommendation per radiology is for a repeat LDCT in 12 months. .Please let them  know we will order and schedule their  annual screening scan for 10/2021. Please let them  know there was notation of CAD on their  scan.  Please remind the patient  that this is a non-gated exam therefore degree or severity of disease  cannot be determined. Please have them  follow up with their PCP regarding potential risk factor modification, dietary therapy or pharmacologic therapy if clinically indicated. Pt.  is not  currently on statin therapy. Please place order for annual  screening scan for  10/2021 and fax results to PCP. Thanks so much.  Again, notation of aortic atherosclerosis, in addition to left main and 3 vessel coronary artery disease. He is not on a statin. He is followed by Cards.

## 2020-11-14 ENCOUNTER — Other Ambulatory Visit: Payer: Self-pay | Admitting: *Deleted

## 2020-11-14 DIAGNOSIS — Z87891 Personal history of nicotine dependence: Secondary | ICD-10-CM

## 2020-11-20 ENCOUNTER — Telehealth: Payer: Self-pay | Admitting: *Deleted

## 2020-11-20 NOTE — Telephone Encounter (Signed)
Spoke with the pts wife [place] (on [place]), and informed her that per Dr. Marlou Porch, we need to bring the pt in to see Dr. Marlou Porch, to further discuss statin therapy for noted findings on recent CT Chest WO Contrast that he had done at his Pulmonologist office. Informed the pts wife that I have tried calling him multiple times today to discuss this plan, and pts cell phone goes straight to voicemail.  Informed the wife that Dr. Marlou Porch has an opening for tomorrow 3/3 at 1:20 pm, if she thinks the pt would be able to come in at that time.  Wife states to go ahead and schedule the pt to come in and see Dr. Marlou Porch for tomorrow 3/3 at 1:20 pm, and if there is any conflict with this appt, she will have the pt call back first thing in the morning, to reschedule.  Advised the pts wife to have him arrive 15 mins prior to this appt.  Wife verbalized understanding and agrees with this plan. Wife will endorse to the pt when he gets home.

## 2020-11-20 NOTE — Telephone Encounter (Signed)
Pts Pulmonologist Eric Form NP, faxed over the pts CT Chest WO results they ordered on the pt recently.  Below is the results from this test, which mentions by Judson Roch that the pt has noted aortic atherosclerosis, in addition to left main and 3 vessel coronary artery disease, and NOT on statin therapy at this time. Per Dr. Marlou Porch, based on this report, he would like for the pt to come back into clinic to see him at next available for tomorrow 3/3 at 1:20 pm, to discuss starting him on a statin regimen, to treat this. Called the pt and left him a detailed message to call the office back in regards to this, and schedule him to see Dr. Marlou Porch for tomorrow 3/3.  Slot held until the end of the day, to await pt call back.     Magdalen Spatz, NP  11/13/2020 5:04 PM EST      Please call patient and let them know their low dose Ct was read as a Lung RADS 2: nodules that are benign in appearance and behavior with a very low likelihood of becoming a clinically active cancer due to size or lack of growth. Recommendation per radiology is for a repeat LDCT in 12 months. .Please let them know we will order and schedule their annual screening scan for 10/2021. Please let them know there was notation of CAD on their scan. Please remind the patient that this is a non-gated exam therefore degree or severity of disease cannot be determined. Please have them follow up with their PCP regarding potential risk factor modification, dietary therapy or pharmacologic therapy if clinically indicated. Pt. is not currently on statin therapy. Please place order for annual screening scan for 10/2021 and fax results to PCP. Thanks so much.  Again, notation of aortic atherosclerosis, in addition to left main and 3 vessel coronary artery disease. He is not on a statin. He is followed by Cards.

## 2020-11-21 ENCOUNTER — Encounter: Payer: Self-pay | Admitting: Cardiology

## 2020-11-21 ENCOUNTER — Other Ambulatory Visit: Payer: Self-pay

## 2020-11-21 ENCOUNTER — Ambulatory Visit: Payer: Medicare HMO | Admitting: Cardiology

## 2020-11-21 VITALS — BP 120/70 | HR 90 | Ht 72.0 in | Wt 183.0 lb

## 2020-11-21 DIAGNOSIS — I2510 Atherosclerotic heart disease of native coronary artery without angina pectoris: Secondary | ICD-10-CM

## 2020-11-21 DIAGNOSIS — I4510 Unspecified right bundle-branch block: Secondary | ICD-10-CM | POA: Diagnosis not present

## 2020-11-21 MED ORDER — ASPIRIN EC 81 MG PO TBEC: 81.0000 mg | DELAYED_RELEASE_TABLET | Freq: Every day | ORAL | 3 refills | Status: DC

## 2020-11-21 MED ORDER — ROSUVASTATIN CALCIUM 20 MG PO TABS: 20.0000 mg | ORAL_TABLET | Freq: Every day | ORAL | 3 refills | Status: DC

## 2020-11-21 NOTE — Patient Instructions (Signed)
Medication Instructions:  Start taking Crestor 20 mg daily; ASA 81mg  daily *If you need a refill on your cardiac medications before your next appointment, please call your pharmacy*   Lab Work: none If you have labs (blood work) drawn today and your tests are completely normal, you will receive your results only by: [name] MyChart Message (if you have MyChart) OR . A paper copy in the mail If you have any lab test that is abnormal or we need to change your treatment, we will call you to review the results.   Testing/Procedures: Your physician has requested that you have a lexiscan myoview. For further information please visit [email or web address]. Please follow instruction sheet, as given.  Labs: 3 months: Lipids; ALT    Follow-Up: At [facility], you and your health needs are our priority.  As part of our continuing mission to provide you with exceptional heart care, we have created designated Provider Care Teams.  These Care Teams include your primary Cardiologist (physician) and Advanced Practice Providers (APPs -  Physician Assistants and Nurse Practitioners) who all work together to provide you with the care you need, when you need it.  We recommend signing up for the patient portal called "MyChart".  Sign up information is provided on this After Visit Summary.  MyChart is used to connect with patients for Virtual Visits (Telemedicine).  Patients are able to view lab/test results, encounter notes, upcoming appointments, etc.  Non-urgent messages can be sent to your provider as well.   To learn more about what you can do with MyChart, go to [email or web address].    Your next appointment:   6 month(s)  The format for your next appointment:   In Person  Provider:   You may see Candee Furbish, MD or one of the following Advanced Practice Providers on your designated Care Team:    Kathyrn Drown, NP

## 2020-11-21 NOTE — Progress Notes (Signed)
Cardiology Office Note:    Date:  11/21/2020   ID:  [name], DOB August 02, 1946, MRN [id number]  PCP:  Lawerance Cruel, [place]  Cardiologist:  Candee Furbish, MD  Advanced Practice Provider:  No care team member to display Electrophysiologist:  None       Referring MD: Lawerance Cruel, MD      History of Present Illness:    [name] is a 75 y.o. male here for the evaluation of coronary artery disease/coronary artery calcification/aortic atherosclerosis seen on lung cancer screening CT.  Showed him personally in clinic the images.  Triple-vessel dense calcification.  He states that he exercises, elliptical etc., no treadmill because of cervical spine surgery.  Does not have any noticeable anginal type symptoms.  He was recently seen a few days ago for murmur evaluation.  Awaiting echocardiogram on day.  Past Medical History:  Diagnosis Date  . Abdominal discomfort   . Dysrhythmia    occ pac  . Esophageal cancer ([place])    and prostate  . GERD (gastroesophageal reflux disease)   . Hoarseness   . Wears hearing aid     Past Surgical History:  Procedure Laterality Date  . DIRECT LARYNGOSCOPY WITH RADIAESSE INJECTION  02/08/2012   Procedure: DIRECT LARYNGOSCOPY WITH RADIAESSE INJECTION;  Surgeon: Jodi Marble, MD;  Location: [place];  Service: ENT;  Laterality: Bilateral;  microdirect-laryngosocopy  with bilateral vocal cord radiesse injection  . DIRECT LARYNGOSCOPY WITH RADIAESSE INJECTION  03/21/2012   Procedure: DIRECT LARYNGOSCOPY WITH RADIAESSE INJECTION;  Surgeon: Jodi Marble, MD;  Location: [place];  Service: ENT;  Laterality: N/A;  microdirect laryngoscopy with radiaesse injection left vocal cord  . DIRECT LARYNGOSCOPY WITH RADIAESSE INJECTION N/A 01/23/2013   Procedure: DIRECT LARYNGOSCOPY WITH RADIESSE VOCAL CORD AUGMENTATION LARYNGOPLASTY;  Surgeon: Jodi Marble, MD;  Location: [place];  Service: ENT;  Laterality: N/A;  . ESOPHAGECTOMY  10/02/2010   Dr.Burney  . ESOPHAGOGASTRODUODENOSCOPY N/A 09/24/2015   Procedure: ESOPHAGOGASTRODUODENOSCOPY (EGD);  Surgeon: Teena Irani, MD;  Location: [facility];  Service: Endoscopy;  Laterality: N/A;  . ESOPHAGOGASTRODUODENOSCOPY N/A 12/12/2015   Procedure: ESOPHAGOGASTRODUODENOSCOPY (EGD);  Surgeon: Clarene Essex, MD;  Location: [facility];  Service: Endoscopy;  Laterality: N/A;  . ESOPHAGOSCOPY  03/21/2012   Procedure: ESOPHAGOSCOPY;  Surgeon: Jodi Marble, MD;  Location: [place];  Service: ENT;  Laterality: N/A;  . JEJUNOSTOMY  09/29/2010   Dr.Burney  . LARYNGOPLASTY  2016  . MICROLARYNGOSCOPY W/VOCAL CORD INJECTION  08/01/2012   Procedure: MICROLARYNGOSCOPY WITH VOCAL CORD INJECTION;  Surgeon: Jodi Marble, MD;  Location: [place];  Service: ENT;  Laterality: Left;  MICRO DIRECT LARYNGOSCOPY  AND RADIESSE  INJECTION   . MICROLARYNGOSCOPY W/VOCAL CORD INJECTION N/A 08/07/2013   Procedure: MICROLARYNGOSCOPY WITH RADIESSE VOCAL CORD AUGMENTATION ;  Surgeon: Jodi Marble, MD;  Location: [place];  Service: ENT;  Laterality: N/A;  . MICROLARYNGOSCOPY W/VOCAL CORD INJECTION N/A 02/19/2014   Procedure: MICROLARYNGOSCOPY WITH BILATERAL RADIESSE  VOCAL CORD AUGMENTATION;  Surgeon: Jodi Marble, MD;  Location: [place];  Service: ENT;  Laterality: N/A;  . POSTERIOR CERVICAL FUSION/FORAMINOTOMY N/A 06/20/2018   Procedure: POSTERIOR CERVICAL LAMINECTOMY, POSTERIOR INSTRUMENTATION AND FUSION CERVICAL THREE-FOUR, CERVICAL FOUR-FIVE;  Surgeon: Newman Pies, MD;  Location: [place];  Service: Neurosurgery;  Laterality: N/A;  POSTERIOR CERVICAL LAMINECTOMY, POSTERIOR INSTRUMENTATION AND FUSION CERVICAL THREE-FOUR, CERVICAL FOUR-FIVE  . PYLOROPLASTY  09/29/2010   Dr.Burney    Current Medications: Current Meds  Medication Sig  . aspirin EC 81 MG tablet Take 1  tablet (81 mg total) by mouth daily. Swallow whole.  . irbesartan (AVAPRO) 150 MG tablet Take 150 mg by mouth daily.  . Magnesium 250 MG TABS Take 250 mg by mouth 3 (three) times daily.  . Menthol, Topical Analgesic, (BIOFREEZE EX) Apply 1 application topically daily as needed (muscle pain).  . Multiple Vitamin (MULTIVITAMIN WITH MINERALS) TABS tablet Take 1 tablet by mouth daily.  . Multiple Vitamins-Minerals (ANTIOXIDANT FORMULA) TABS Take 1 tablet by mouth daily.  [name] omeprazole (PRILOSEC) 40 MG capsule Take 40 mg by mouth daily.   . pramipexole (MIRAPEX) 1 MG tablet Take 3 tablets by mouth at bedtime.  . rosuvastatin (CRESTOR) 20 MG tablet Take 1 tablet (20 mg total) by mouth daily.  . sildenafil (REVATIO) 20 MG tablet Take 40-60 mg by mouth daily as needed (erectile dysfunction).  . triamcinolone cream (KENALOG) 0.1 % Apply 1 application topically daily as needed (rash).     Allergies:   Patient has no known allergies.   Social History   Socioeconomic History  . Marital status: Married    Spouse name: Not on file  . Number of children: Not on file  . Years of education: Not on file  . Highest education level: Not on file  Occupational History  . Not on file  Tobacco Use  . Smoking status: Former Smoker    Quit date: 06/13/2009    Years since quitting: 11.4  . Smokeless tobacco: Never Used  Substance and Sexual Activity  . Alcohol use: Yes    Alcohol/week: 10.0 standard drinks    Types: 10 Standard drinks or equivalent per week    Comment: avg 10 Jack Daniels/week  . Drug use: No  . Sexual activity: Not on file  Other Topics Concern  . Not on file  Social History Narrative  . Not on file   Social Determinants of Health   Financial Resource Strain: Not on file  Food Insecurity: Not on file  Transportation Needs: Not on file  Physical Activity: Not on file  Stress: Not on file  Social Connections: Not on file     Family History: The patient's family history  includes Heart attack in his father.  ROS:   Please see the history of present illness.     All other systems reviewed and are negative.  EKGs/Labs/Other Studies Reviewed:    The following studies were reviewed today:  CT scan lung: 11/11/20 There is aortic atherosclerosis, as well as atherosclerosis of the great vessels of the mediastinum and the coronary arteries, including calcified atherosclerotic plaque in the left main, left anterior descending, left circumflex and right coronary arteries. Calcifications of the aortic valve.  EKG: Sinus rhythm right bundle branch block on 10/25/2020  Recent Labs: No results found for requested labs within last 8760 hours.  Recent Lipid Panel No results found for: CHOL, TRIG, HDL, CHOLHDL, VLDL, LDLCALC, LDLDIRECT   Risk Assessment/Calculations:      Physical Exam:    VS:  BP 120/70 (BP Location: Left Arm, Patient Position: Sitting, Cuff Size: Normal)   Pulse 90   Ht 6' (1.829 m)   Wt 183 lb (83 kg)   SpO2 97%   BMI 24.82 kg/m     Wt Readings from Last 3 Encounters:  11/21/20 183 lb (83 kg)  11/11/20 188 lb 3.2 oz (85.4 kg)  11/04/20 186  lb (84.4 kg)     GEN:  Well nourished, well developed in no acute distress HEENT: Normal NECK: No JVD; No carotid bruits LYMPHATICS: No lymphadenopathy CARDIAC: RRR, no murmurs, rubs, gallops RESPIRATORY:  Clear to auscultation without rales, wheezing or rhonchi  ABDOMEN: Soft, non-tender, non-distended MUSCULOSKELETAL:  No edema; No deformity  SKIN: Warm and dry NEUROLOGIC:  Alert and oriented x 3 PSYCHIATRIC:  Normal affect   ASSESSMENT:    1. Coronary artery disease involving native coronary artery of native heart without angina pectoris   2. RBBB    PLAN:    In order of problems listed above:  Aortic atherosclerosis and triple-vessel coronary artery disease/coronary artery calcification seen on lung cancer screening CT -We will go ahead and check a pharmacologic stress test to  make sure there is no high risk evidence of ischemia. -We will also go ahead and initiate Crestor 20 mg daily, high intensity statin in order to help protect. -Start aspirin 81 mg.  Watch for any signs of bleeding. -We will go ahead and repeat lipid panel in 3 months with ALT in 3 months -Overall he states that his cholesterol is always been reasonable.  Current total cholesterol 180 HDL 81 LDL 83 triglycerides 84. -Continue with exercise.  Watch for any signs of anginal symptoms.  Heart murmur -Echocardiogram currently pending.  Right bundle-branch block -No higher symptoms such as syncope.  Esophageal cancer -Stable monitored by Dr. Paulita Fujita.  Prior surgical procedure by Dr. [name].  Nuclear stress test risks and benefits reviewed.  Medication Adjustments/Labs and Tests Ordered: Current medicines are reviewed at length with the patient today.  Concerns regarding medicines are outlined above.  Orders Placed This Encounter  Procedures  . Lipid panel  . ALT  . MYOCARDIAL PERFUSION IMAGING   Meds ordered this encounter  Medications  . rosuvastatin (CRESTOR) 20 MG tablet    Sig: Take 1 tablet (20 mg total) by mouth daily.    Dispense:  90 tablet    Refill:  3  . aspirin EC 81 MG tablet    Sig: Take 1 tablet (81 mg total) by mouth daily. Swallow whole.    Dispense:  90 tablet    Refill:  3    Patient Instructions  Medication Instructions:  Start taking Crestor 20 mg daily; ASA 81mg  daily *If you need a refill on your cardiac medications before your next appointment, please call your pharmacy*   Lab Work: none If you have labs (blood work) drawn today and your tests are completely normal, you will receive your results only by: [name] MyChart Message (if you have MyChart) OR . A paper copy in the mail If you have any lab test that is abnormal or we need to change your treatment, we will call you to review the results.   Testing/Procedures: Your physician has requested that  you have a lexiscan myoview. For further information please visit [email or web address]. Please follow instruction sheet, as given.  Labs: 3 months: Lipids; ALT    Follow-Up: At [facility], you and your health needs are our priority.  As part of our continuing mission to provide you with exceptional heart care, we have created designated Provider Care Teams.  These Care Teams include your primary Cardiologist (physician) and Advanced Practice Providers (APPs -  Physician Assistants and Nurse Practitioners) who all work together to provide you with the care you need, when you need it.  We recommend signing up for the patient portal called "MyChart".  Sign up  information is provided on this After Visit Summary.  MyChart is used to connect with patients for Virtual Visits (Telemedicine).  Patients are able to view lab/test results, encounter notes, upcoming appointments, etc.  Non-urgent messages can be sent to your provider as well.   To learn more about what you can do with MyChart, go to [email or web address].    Your next appointment:   6 month(s)  The format for your next appointment:   In Person  Provider:   You may see Candee Furbish, MD or one of the following Advanced Practice Providers on your designated Care Team:    Kathyrn Drown, NP         Signed, Candee Furbish, MD  11/21/2020 1:58 PM    [place]

## 2020-11-25 ENCOUNTER — Other Ambulatory Visit: Payer: Self-pay

## 2020-11-25 ENCOUNTER — Ambulatory Visit (HOSPITAL_COMMUNITY): Payer: Medicare HMO | Attending: Cardiology

## 2020-11-25 DIAGNOSIS — R079 Chest pain, unspecified: Secondary | ICD-10-CM | POA: Insufficient documentation

## 2020-11-25 DIAGNOSIS — R011 Cardiac murmur, unspecified: Secondary | ICD-10-CM | POA: Diagnosis not present

## 2020-11-25 LAB — ECHOCARDIOGRAM COMPLETE
AR max vel: 1.71 cm2
AV Area VTI: 1.85 cm2
AV Area mean vel: 1.57 cm2
AV Mean grad: 8.3 mmHg
AV Peak grad: 14 mmHg
Ao pk vel: 1.87 m/s
Area-P 1/2: 3.53 cm2
S' Lateral: 3.3 cm

## 2020-11-27 ENCOUNTER — Telehealth (HOSPITAL_COMMUNITY): Payer: Self-pay | Admitting: *Deleted

## 2020-11-27 NOTE — Telephone Encounter (Signed)
Patient given detailed instructions per Myocardial Perfusion Study Information Sheet for the test on 12/03/19 at 7:45. Patient notified to arrive 15 minutes early and that it is imperative to arrive on time for appointment to keep from having the test rescheduled.  If you need to cancel or reschedule your appointment, please call the office within 24 hours of your appointment. . Patient verbalized understanding.[name]

## 2020-11-28 DIAGNOSIS — I10 Essential (primary) hypertension: Secondary | ICD-10-CM | POA: Diagnosis not present

## 2020-11-28 DIAGNOSIS — M19049 Primary osteoarthritis, unspecified hand: Secondary | ICD-10-CM | POA: Diagnosis not present

## 2020-11-28 DIAGNOSIS — J449 Chronic obstructive pulmonary disease, unspecified: Secondary | ICD-10-CM | POA: Diagnosis not present

## 2020-11-28 DIAGNOSIS — D509 Iron deficiency anemia, unspecified: Secondary | ICD-10-CM | POA: Diagnosis not present

## 2020-11-28 DIAGNOSIS — C61 Malignant neoplasm of prostate: Secondary | ICD-10-CM | POA: Diagnosis not present

## 2020-11-28 DIAGNOSIS — M179 Osteoarthritis of knee, unspecified: Secondary | ICD-10-CM | POA: Diagnosis not present

## 2020-11-28 DIAGNOSIS — K219 Gastro-esophageal reflux disease without esophagitis: Secondary | ICD-10-CM | POA: Diagnosis not present

## 2020-12-02 ENCOUNTER — Other Ambulatory Visit: Payer: Self-pay

## 2020-12-02 ENCOUNTER — Ambulatory Visit (HOSPITAL_COMMUNITY): Payer: Medicare HMO | Attending: Cardiology

## 2020-12-02 VITALS — Ht 72.0 in | Wt 183.0 lb

## 2020-12-02 DIAGNOSIS — R079 Chest pain, unspecified: Secondary | ICD-10-CM | POA: Diagnosis not present

## 2020-12-02 DIAGNOSIS — I2510 Atherosclerotic heart disease of native coronary artery without angina pectoris: Secondary | ICD-10-CM | POA: Diagnosis not present

## 2020-12-02 DIAGNOSIS — I4510 Unspecified right bundle-branch block: Secondary | ICD-10-CM | POA: Insufficient documentation

## 2020-12-02 LAB — MYOCARDIAL PERFUSION IMAGING
LV dias vol: 82 mL (ref 62–150)
LV sys vol: 42 mL
Peak HR: 101 {beats}/min
Rest HR: 81 {beats}/min
SDS: 4
SRS: 10
SSS: 15
TID: 1.06

## 2020-12-02 MED ORDER — TECHNETIUM TC 99M TETROFOSMIN IV KIT
32.2000 | PACK | Freq: Once | INTRAVENOUS | Status: AC | PRN
  Administered 2020-12-02: 32.2 via INTRAVENOUS
  Filled 2020-12-02: qty 33

## 2020-12-02 MED ORDER — REGADENOSON 0.4 MG/5ML IV SOLN
0.4000 mg | Freq: Once | INTRAVENOUS | Status: AC
  Administered 2020-12-02: 0.4 mg via INTRAVENOUS

## 2020-12-02 MED ORDER — TECHNETIUM TC 99M TETROFOSMIN IV KIT
10.6000 | PACK | Freq: Once | INTRAVENOUS | Status: AC | PRN
  Administered 2020-12-02: 10.6 via INTRAVENOUS
  Filled 2020-12-02: qty 11

## 2020-12-03 ENCOUNTER — Other Ambulatory Visit (HOSPITAL_COMMUNITY)
Admission: RE | Admit: 2020-12-03 | Discharge: 2020-12-03 | Disposition: A | Discharge status: Home / Self Care | Payer: Medicare HMO | Source: Ambulatory Visit | Attending: Cardiovascular Disease | Admitting: Cardiovascular Disease

## 2020-12-03 ENCOUNTER — Encounter: Payer: Self-pay | Admitting: Cardiology

## 2020-12-03 ENCOUNTER — Ambulatory Visit: Payer: Medicare HMO | Admitting: Cardiology

## 2020-12-03 VITALS — BP 120/60 | HR 89 | Ht 72.0 in | Wt 185.0 lb

## 2020-12-03 DIAGNOSIS — Z01812 Encounter for preprocedural laboratory examination: Secondary | ICD-10-CM | POA: Diagnosis not present

## 2020-12-03 DIAGNOSIS — Z20822 Contact with and (suspected) exposure to covid-19: Secondary | ICD-10-CM | POA: Insufficient documentation

## 2020-12-03 DIAGNOSIS — I2510 Atherosclerotic heart disease of native coronary artery without angina pectoris: Secondary | ICD-10-CM

## 2020-12-03 DIAGNOSIS — I4510 Unspecified right bundle-branch block: Secondary | ICD-10-CM | POA: Diagnosis not present

## 2020-12-03 DIAGNOSIS — R9439 Abnormal result of other cardiovascular function study: Secondary | ICD-10-CM | POA: Diagnosis not present

## 2020-12-03 LAB — BASIC METABOLIC PANEL
BUN/Creatinine Ratio: 13 (ref 10–24)
BUN: 15 mg/dL (ref 8–27)
CO2: 24 mmol/L (ref 20–29)
Calcium: 9.5 mg/dL (ref 8.6–10.2)
Chloride: 100 mmol/L (ref 96–106)
Creatinine, Ser: 1.2 mg/dL (ref 0.76–1.27)
Glucose: 100 mg/dL — ABNORMAL HIGH (ref 65–99)
Potassium: 4.8 mmol/L (ref 3.5–5.2)
Sodium: 141 mmol/L (ref 134–144)
eGFR: 63 mL/min/{1.73_m2} (ref >59)

## 2020-12-03 LAB — CBC
Hematocrit: 41.6 % (ref 37.5–51.0)
Hemoglobin: 13.8 g/dL (ref 13.0–17.7)
MCH: 28.8 pg (ref 26.6–33.0)
MCHC: 33.2 g/dL (ref 31.5–35.7)
MCV: 87 fL (ref 79–97)
Platelets: 236 10*3/uL (ref 150–450)
RBC: 4.79 x10E6/uL (ref 4.14–5.80)
RDW: 16 % — ABNORMAL HIGH (ref 11.6–15.4)
WBC: 6.6 10*3/uL (ref 3.4–10.8)

## 2020-12-03 LAB — SARS CORONAVIRUS 2 (TAT 6-24 HRS): SARS Coronavirus 2: NEGATIVE

## 2020-12-03 MED ORDER — ROSUVASTATIN CALCIUM 20 MG PO TABS: 20.0000 mg | ORAL_TABLET | Freq: Every day | ORAL | 3 refills | Status: DC

## 2020-12-03 NOTE — Progress Notes (Signed)
Cardiology Office Note:    Date:  12/03/2020   ID:  [name], DOB [birth year], MRN [id number]  PCP:  Lawerance Cruel, [place]  Cardiologist:  Candee Furbish, MD  Advanced Practice Provider:  No care team member to display Electrophysiologist:  None       Referring MD: Lawerance Cruel, MD   History of Present Illness:    [name] is a 75 y.o. male here to discuss abnormal nuclear stress test with inferior wall infarct pattern with mild peri-infarct ischemia.  Had coronary artery disease with coronary artery calcification noted on lung cancer screening CT.  Personally reviewed images which showed triple-vessel dense calcification.  Does not use the treadmill because of cervical spine surgery.  Does not have any extremely noticeable anginal type symptoms.  Echocardiogram also performed to evaluate murmur. Low normal pump function - basal inferior hypokinesis (awaiting NUC stress test for further clarification) Mild mitral regurgitation - likely source of murmur Mild aortic sclerosis with no stenosis  His wife was present during this visit.  Overall he enjoys exercise, lifting weights he states.  Had some heart rates in the 50s and decided to stop his Crestor thinking that this was the last thing that he started.  Past Medical History:  Diagnosis Date  . Abdominal discomfort   . Dysrhythmia    occ pac  . Esophageal cancer ([name])    and prostate  . GERD (gastroesophageal reflux disease)   . Hoarseness   . Wears hearing aid     Past Surgical History:  Procedure Laterality Date  . DIRECT LARYNGOSCOPY WITH RADIAESSE INJECTION  02/08/2012   Procedure: DIRECT LARYNGOSCOPY WITH RADIAESSE INJECTION;  Surgeon: Jodi Marble, MD;  Location: [place];  Service: ENT;  Laterality: Bilateral;  microdirect-laryngosocopy  with bilateral vocal cord radiesse injection  . DIRECT LARYNGOSCOPY WITH RADIAESSE INJECTION  03/21/2012    Procedure: DIRECT LARYNGOSCOPY WITH RADIAESSE INJECTION;  Surgeon: Jodi Marble, MD;  Location: [place];  Service: ENT;  Laterality: N/A;  microdirect laryngoscopy with radiaesse injection left vocal cord  . DIRECT LARYNGOSCOPY WITH RADIAESSE INJECTION N/A 01/23/2013   Procedure: DIRECT LARYNGOSCOPY WITH RADIESSE VOCAL CORD AUGMENTATION LARYNGOPLASTY;  Surgeon: Jodi Marble, MD;  Location: [place];  Service: ENT;  Laterality: N/A;  . ESOPHAGECTOMY  10/02/2010   Dr.Burney  . ESOPHAGOGASTRODUODENOSCOPY N/A 09/24/2015   Procedure: ESOPHAGOGASTRODUODENOSCOPY (EGD);  Surgeon: Teena Irani, MD;  Location: [facility];  Service: Endoscopy;  Laterality: N/A;  . ESOPHAGOGASTRODUODENOSCOPY N/A 12/12/2015   Procedure: ESOPHAGOGASTRODUODENOSCOPY (EGD);  Surgeon: Clarene Essex, MD;  Location: [facility];  Service: Endoscopy;  Laterality: N/A;  . ESOPHAGOSCOPY  03/21/2012   Procedure: ESOPHAGOSCOPY;  Surgeon: Jodi Marble, MD;  Location: [place];  Service: ENT;  Laterality: N/A;  . JEJUNOSTOMY  09/29/2010   Dr.Burney  . LARYNGOPLASTY  2016  . MICROLARYNGOSCOPY W/VOCAL CORD INJECTION  08/01/2012   Procedure: MICROLARYNGOSCOPY WITH VOCAL CORD INJECTION;  Surgeon: Jodi Marble, MD;  Location: [place];  Service: ENT;  Laterality: Left;  MICRO DIRECT LARYNGOSCOPY  AND RADIESSE  INJECTION   . MICROLARYNGOSCOPY W/VOCAL CORD INJECTION N/A 08/07/2013   Procedure: MICROLARYNGOSCOPY WITH RADIESSE VOCAL CORD AUGMENTATION ;  Surgeon: Jodi Marble, MD;  Location: [place];  Service: ENT;  Laterality: N/A;  . MICROLARYNGOSCOPY W/VOCAL CORD INJECTION N/A 02/19/2014   Procedure: MICROLARYNGOSCOPY WITH BILATERAL RADIESSE  VOCAL CORD AUGMENTATION;  Surgeon: Jodi Marble,  MD;  Location: [place];  Service: ENT;  Laterality: N/A;  . POSTERIOR CERVICAL FUSION/FORAMINOTOMY N/A 06/20/2018   Procedure: POSTERIOR CERVICAL LAMINECTOMY,  POSTERIOR INSTRUMENTATION AND FUSION CERVICAL THREE-FOUR, CERVICAL FOUR-FIVE;  Surgeon: Newman Pies, MD;  Location: [place];  Service: Neurosurgery;  Laterality: N/A;  POSTERIOR CERVICAL LAMINECTOMY, POSTERIOR INSTRUMENTATION AND FUSION CERVICAL THREE-FOUR, CERVICAL FOUR-FIVE  . PYLOROPLASTY  09/29/2010   Dr.Burney    Current Medications: Current Meds  Medication Sig  . aspirin EC 81 MG tablet Take 1 tablet (81 mg total) by mouth daily. Swallow whole.  . irbesartan (AVAPRO) 150 MG tablet Take 150 mg by mouth daily.  . Magnesium 250 MG TABS Take 250 mg by mouth 3 (three) times daily.  . Menthol, Topical Analgesic, (BIOFREEZE EX) Apply 1 application topically daily as needed (muscle pain).  . Multiple Vitamin (MULTIVITAMIN WITH MINERALS) TABS tablet Take 1 tablet by mouth daily.  . Multiple Vitamins-Minerals (ANTIOXIDANT FORMULA) TABS Take 1 tablet by mouth daily.  [name] omeprazole (PRILOSEC) 40 MG capsule Take 40 mg by mouth daily.   . pramipexole (MIRAPEX) 1 MG tablet Take 3 tablets by mouth at bedtime.  . sildenafil (REVATIO) 20 MG tablet Take 40-60 mg by mouth daily as needed (erectile dysfunction).  . triamcinolone cream (KENALOG) 0.1 % Apply 1 application topically daily as needed (rash).     Allergies:   Patient has no known allergies.   Social History   Socioeconomic History  . Marital status: Married    Spouse name: Not on file  . Number of children: Not on file  . Years of education: Not on file  . Highest education level: Not on file  Occupational History  . Not on file  Tobacco Use  . Smoking status: Former Smoker    Quit date: 06/13/2009    Years since quitting: 11.4  . Smokeless tobacco: Never Used  Substance and Sexual Activity  . Alcohol use: Yes    Alcohol/week: 10.0 standard drinks    Types: 10 Standard drinks or equivalent per week    Comment: avg 10 Jack Daniels/week  . Drug use: No  . Sexual activity: Not on file  Other Topics Concern  . Not on file   Social History Narrative  . Not on file   Social Determinants of Health   Financial Resource Strain: Not on file  Food Insecurity: Not on file  Transportation Needs: Not on file  Physical Activity: Not on file  Stress: Not on file  Social Connections: Not on file     Family History: The patient's family history includes Heart attack in his father.  ROS:   Please see the history of present illness.     All other systems reviewed and are negative.  EKGs/Labs/Other Studies Reviewed:    The following studies were reviewed today:  CT lungs: 2022 There is aortic atherosclerosis, as well as atherosclerosis of the great vessels of the mediastinum and the coronary arteries, including calcified atherosclerotic plaque in the left main, left anterior descending, left circumflex and right coronary arteries. Calcifications of the aortic valve.  NUC stress 2022:   The left ventricular ejection fraction is mildly decreased (45-54%).  Nuclear stress EF: 48%. Mild inferior wall hypokinesis  There was no ST segment deviation noted during stress.  Defect 1: There is a large defect of severe severity present in the basal inferoseptal, basal inferior and mid inferior location.  Findings consistent with prior myocardial infarction with peri-infarct ischemia.  This is an intermediate  risk study.  EKG:  EKG is  ordered today.  The ekg ordered today demonstrates sinus rhythm right bundle-branch block, PVCs noted, heart rate 85 bpm  Recent Labs: No results found for requested labs within last 8760 hours.  Recent Lipid Panel No results found for: CHOL, TRIG, HDL, CHOLHDL, VLDL, LDLCALC, LDLDIRECT   Risk Assessment/Calculations:       Physical Exam:    VS:  BP 120/60 (BP Location: Left Arm, Patient Position: Sitting, Cuff Size: Normal)   Pulse 89   Ht 6' (1.829 m)   Wt 185 lb (83.9 kg)   SpO2 99%   BMI 25.09 kg/m     Wt Readings from Last 3 Encounters:  12/03/20 185 lb  (83.9 kg)  12/02/20 183 lb (83 kg)  11/21/20 183 lb (83 kg)     GEN:  Well nourished, well developed in no acute distress HEENT: Normal NECK: No JVD; No carotid bruits LYMPHATICS: No lymphadenopathy CARDIAC: RRR, no murmurs, rubs, gallops RESPIRATORY:  Clear to auscultation without rales, wheezing or rhonchi  ABDOMEN: Soft, non-tender, non-distended MUSCULOSKELETAL:  No edema; No deformity  SKIN: Warm and dry NEUROLOGIC:  Alert and oriented x 3 PSYCHIATRIC:  Normal affect   ASSESSMENT:    1. Coronary artery disease involving native coronary artery of native heart without angina pectoris   2. RBBB    PLAN:    In order of problems listed above:  Abnormal nuclear stress test in the setting of severe triple-vessel coronary artery disease/calcification on CT scan. -He has an old inferior infarct pattern, peri-infarct ischemia.  We will proceed with cardiac catheterization.  Hyperlipidemia -He stopped his Crestor 20 mg because he checked his heart rate 1 day and it was in the 50s.  I reassured him that Crestor would not cause a decreased heart rate.  Also expressed the importance of this medication for him.  He will restart.  Prior LDL 83.  Right bundle branch block -No high risk symptoms such as syncope.  Esophageal cancer -Stable, monitored by Dr. Paulita Fujita.  Had prior surgical procedure by Dr. [name].  Heart murmur -Mild mitral regurgitation noted.   Shared Decision Making/Informed Consent The risks [stroke (1 in 1000), death (1 in 1000), kidney failure [usually temporary] (1 in 500), bleeding (1 in 200), allergic reaction [possibly serious] (1 in 200)], benefits (diagnostic support and management of coronary artery disease) and alternatives of a cardiac catheterization were discussed in detail with Mr. Cutrone and he is willing to proceed.  Wife was present for discussion.        Medication Adjustments/Labs and Tests Ordered: Current medicines are reviewed at length  with the patient today.  Concerns regarding medicines are outlined above.  No orders of the defined types were placed in this encounter.  No orders of the defined types were placed in this encounter.   There are no Patient Instructions on file for this visit.   Signed, Candee Furbish, MD  12/03/2020 10:09 AM    [facility]

## 2020-12-03 NOTE — Patient Instructions (Signed)
Medication Instructions:  Restart Crestor 20 mg a day.  Continue all other mediations as listed.  *If you need a refill on your cardiac medications before your next appointment, please call your pharmacy*  Lab Work: Please have today (CBC, BMP)  If you have labs (blood work) drawn today and your tests are completely normal, you will receive your results only by: [name] MyChart Message (if you have MyChart) OR . A paper copy in the mail If you have any lab test that is abnormal or we need to change your treatment, we will call you to review the results.   Testing/Procedures:   [facility] OFFICE [place], [address] [place] [address] Dept: [place]: [place]  12/03/2020  You are scheduled for a cardiac cath on Thursday 12/05/2020 with Dr. Claiborne Billings.  1. Please arrive at the [facility] (Main Entrance A) at [facility]: [address] [place], [place] [address] at 8:30 am (two hours before your procedure to ensure your preparation). Free valet parking service is available.   Special note: Every effort is made to have your procedure done on time. Please understand that emergencies sometimes delay scheduled procedures.  2. Diet: Nothing after midnight except medication with sips of water.  3. Labs: Please have blood work here today (CBC, BMP)  Covid screening as scheduled.   [place], [place], [place].  This is a Editor, commissioning testing site.  Follow the signs, drive under the portico and remain in your vehicle.  Someone will come to you.  4. Medication instructions in preparation for your procedure:  On the morning of your procedure, take your Aspirin and any morning medicines NOT listed above.  You may use sips of water.  5. Plan for one night stay--bring personal belongings. 6. Bring a current list of your medications and current insurance cards. 7. You  MUST have a responsible person to drive you home. 8. Someone MUST be with you the first 24 hours after you arrive home or your discharge will be delayed. 9. Please wear clothes that are easy to get on and off and wear slip-on shoes.  Thank you for allowing [place] to care for you!   -- [facility]  Follow-Up: At [facility], you and your health needs are our priority.  As part of our continuing mission to provide you with exceptional heart care, we have created designated Provider Care Teams.  These Care Teams include your primary Cardiologist (physician) and Advanced Practice Providers (APPs -  Physician Assistants and Nurse Practitioners) who all work together to provide you with the care you need, when you need it.  We recommend signing up for the patient portal called "MyChart".  Sign up information is provided on this After Visit Summary.  MyChart is used to connect with patients for Virtual Visits (Telemedicine).  Patients are able to view lab/test results, encounter notes, upcoming appointments, etc.  Non-urgent messages can be sent to your provider as well.   To learn more about what you can do with MyChart, go to [email or web address].    Your next appointment:   4 - 6 week(s)  The format for your next appointment:   In Person  Provider:   Candee Furbish, MD   Thank you for choosing [facility]!!

## 2020-12-04 NOTE — Addendum Note (Signed)
Addended by: Candee Furbish C on: 12/04/2020 10:50 AM   Modules accepted: SmartSet

## 2020-12-05 ENCOUNTER — Other Ambulatory Visit: Payer: Self-pay

## 2020-12-05 ENCOUNTER — Ambulatory Visit (HOSPITAL_COMMUNITY)
Admission: RE | Admit: 2020-12-05 | Discharge: 2020-12-05 | Disposition: A | Discharge status: Home / Self Care | Payer: Medicare HMO | Attending: Cardiovascular Disease | Admitting: Cardiovascular Disease

## 2020-12-05 ENCOUNTER — Encounter (HOSPITAL_COMMUNITY): Payer: Self-pay | Admitting: Cardiovascular Disease

## 2020-12-05 ENCOUNTER — Encounter (HOSPITAL_COMMUNITY)
Admission: RE | Disposition: A | Discharge status: Home / Self Care | Payer: Self-pay | Source: Home / Self Care | Attending: Cardiovascular Disease

## 2020-12-05 DIAGNOSIS — I2582 Chronic total occlusion of coronary artery: Secondary | ICD-10-CM | POA: Insufficient documentation

## 2020-12-05 DIAGNOSIS — R9439 Abnormal result of other cardiovascular function study: Secondary | ICD-10-CM | POA: Diagnosis not present

## 2020-12-05 DIAGNOSIS — I2510 Atherosclerotic heart disease of native coronary artery without angina pectoris: Secondary | ICD-10-CM | POA: Insufficient documentation

## 2020-12-05 HISTORY — PX: LEFT HEART CATH AND CORONARY ANGIOGRAPHY: CATH118249

## 2020-12-05 SURGERY — LEFT HEART CATH AND CORONARY ANGIOGRAPHY
Anesthesia: LOCAL

## 2020-12-05 MED ORDER — SODIUM CHLORIDE 0.9 % WEIGHT BASED INFUSION: 1.0000 mL/kg/h | INTRAVENOUS | Status: DC

## 2020-12-05 MED ORDER — VERAPAMIL HCL 2.5 MG/ML IV SOLN
INTRAVENOUS | Status: DC | PRN
  Administered 2020-12-05: 10 mL via INTRA_ARTERIAL

## 2020-12-05 MED ORDER — ACETAMINOPHEN 325 MG PO TABS: 650.0000 mg | ORAL_TABLET | ORAL | Status: DC | PRN

## 2020-12-05 MED ORDER — IOHEXOL 350 MG/ML SOLN
INTRAVENOUS | Status: DC | PRN
  Administered 2020-12-05: 55 mL via INTRA_ARTERIAL

## 2020-12-05 MED ORDER — LABETALOL HCL 5 MG/ML IV SOLN: 10.0000 mg | INTRAVENOUS | Status: DC | PRN

## 2020-12-05 MED ORDER — MIDAZOLAM HCL 2 MG/2ML IJ SOLN
INTRAMUSCULAR | Status: DC | PRN
  Administered 2020-12-05: 2 mg via INTRAVENOUS

## 2020-12-05 MED ORDER — SODIUM CHLORIDE 0.9 % WEIGHT BASED INFUSION
3.0000 mL/kg/h | INTRAVENOUS | Status: AC
  Administered 2020-12-05: 3 mL/kg/h via INTRAVENOUS

## 2020-12-05 MED ORDER — MIDAZOLAM HCL 2 MG/2ML IJ SOLN
INTRAMUSCULAR | Status: AC
  Filled 2020-12-05: qty 2

## 2020-12-05 MED ORDER — VERAPAMIL HCL 2.5 MG/ML IV SOLN
INTRAVENOUS | Status: AC
  Filled 2020-12-05: qty 2

## 2020-12-05 MED ORDER — SODIUM CHLORIDE 0.9% FLUSH: 3.0000 mL | Freq: Two times a day (BID) | INTRAVENOUS | Status: DC

## 2020-12-05 MED ORDER — HEPARIN SODIUM (PORCINE) 1000 UNIT/ML IJ SOLN
INTRAMUSCULAR | Status: DC | PRN
  Administered 2020-12-05: 4200 [IU] via INTRAVENOUS

## 2020-12-05 MED ORDER — ROSUVASTATIN CALCIUM 20 MG PO TABS
40.0000 mg | ORAL_TABLET | Freq: Every day | ORAL | Status: DC
  Filled 2020-12-05: qty 2

## 2020-12-05 MED ORDER — METOPROLOL TARTRATE 25 MG PO TABS: 12.5000 mg | ORAL_TABLET | Freq: Two times a day (BID) | ORAL | 6 refills | Status: DC

## 2020-12-05 MED ORDER — ASPIRIN 81 MG PO CHEW: 81.0000 mg | CHEWABLE_TABLET | ORAL | Status: DC

## 2020-12-05 MED ORDER — LIDOCAINE HCL (PF) 1 % IJ SOLN
INTRAMUSCULAR | Status: DC | PRN
  Administered 2020-12-05: 3 mL

## 2020-12-05 MED ORDER — HEPARIN (PORCINE) IN NACL 1000-0.9 UT/500ML-% IV SOLN
INTRAVENOUS | Status: DC | PRN
  Administered 2020-12-05 (×2): 500 mL

## 2020-12-05 MED ORDER — HEPARIN (PORCINE) IN NACL 1000-0.9 UT/500ML-% IV SOLN
INTRAVENOUS | Status: AC
  Filled 2020-12-05: qty 1000

## 2020-12-05 MED ORDER — HYDRALAZINE HCL 20 MG/ML IJ SOLN: 10.0000 mg | INTRAMUSCULAR | Status: DC | PRN

## 2020-12-05 MED ORDER — ONDANSETRON HCL 4 MG/2ML IJ SOLN: 4.0000 mg | Freq: Four times a day (QID) | INTRAMUSCULAR | Status: DC | PRN

## 2020-12-05 MED ORDER — SODIUM CHLORIDE 0.9 % IV SOLN: 250.0000 mL | INTRAVENOUS | Status: DC | PRN

## 2020-12-05 MED ORDER — LIDOCAINE HCL (PF) 1 % IJ SOLN
INTRAMUSCULAR | Status: AC
  Filled 2020-12-05: qty 30

## 2020-12-05 MED ORDER — FENTANYL CITRATE (PF) 100 MCG/2ML IJ SOLN
INTRAMUSCULAR | Status: DC | PRN
  Administered 2020-12-05: 25 ug via INTRAVENOUS

## 2020-12-05 MED ORDER — HEPARIN SODIUM (PORCINE) 1000 UNIT/ML IJ SOLN
INTRAMUSCULAR | Status: AC
  Filled 2020-12-05: qty 1

## 2020-12-05 MED ORDER — DIAZEPAM 5 MG PO TABS: 5.0000 mg | ORAL_TABLET | ORAL | Status: DC | PRN

## 2020-12-05 MED ORDER — SODIUM CHLORIDE 0.9 % IV SOLN: INTRAVENOUS | Status: DC

## 2020-12-05 MED ORDER — ROSUVASTATIN CALCIUM 40 MG PO TABS: 40.0000 mg | ORAL_TABLET | Freq: Every day | ORAL | 6 refills | Status: DC

## 2020-12-05 MED ORDER — METOPROLOL TARTRATE 12.5 MG HALF TABLET
12.5000 mg | ORAL_TABLET | Freq: Two times a day (BID) | ORAL | Status: DC
  Administered 2020-12-05: 12.5 mg via ORAL
  Filled 2020-12-05: qty 1

## 2020-12-05 MED ORDER — SODIUM CHLORIDE 0.9% FLUSH: 3.0000 mL | INTRAVENOUS | Status: DC | PRN

## 2020-12-05 MED ORDER — ASPIRIN 81 MG PO CHEW: 81.0000 mg | CHEWABLE_TABLET | Freq: Every day | ORAL | Status: DC

## 2020-12-05 MED ORDER — FENTANYL CITRATE (PF) 100 MCG/2ML IJ SOLN
INTRAMUSCULAR | Status: AC
  Filled 2020-12-05: qty 2

## 2020-12-05 SURGICAL SUPPLY — 9 items
CATH OPTITORQUE TIG 4.0 5F (CATHETERS) ×1 IMPLANT
DEVICE RAD COMP TR BAND LRG (VASCULAR PRODUCTS) ×1 IMPLANT
GLIDESHEATH SLEND SS 6F .021 (SHEATH) ×1 IMPLANT
GUIDEWIRE INQWIRE 1.5J.035X260 (WIRE) IMPLANT
INQWIRE 1.5J .035X260CM (WIRE) ×2
KIT HEART LEFT (KITS) ×2 IMPLANT
PACK CARDIAC CATHETERIZATION (CUSTOM PROCEDURE TRAY) ×2 IMPLANT
TRANSDUCER W/STOPCOCK (MISCELLANEOUS) ×2 IMPLANT
TUBING CIL FLEX 10 FLL-RA (TUBING) ×2 IMPLANT

## 2020-12-05 NOTE — Progress Notes (Signed)
Discharge instructions reviewed with patient and family. Verbalized understanding. 

## 2020-12-05 NOTE — Discharge Instructions (Signed)
Radial Site Care  This sheet gives you information about how to care for yourself after your procedure. Your health care provider may also give you more specific instructions. If you have problems or questions, contact your health care provider. What can I expect after the procedure? After the procedure, it is common to have:  Bruising and tenderness at the catheter insertion area. Follow these instructions at home: Medicines  Take over-the-counter and prescription medicines only as told by your health care provider. Insertion site care  Follow instructions from your health care provider about how to take care of your insertion site. Make sure you: ? Wash your hands with soap and water before you change your bandage (dressing). If soap and water are not available, use hand sanitizer. ? Change your dressing as told by your health care provider. ? Leave stitches (sutures), skin glue, or adhesive strips in place. These skin closures may need to stay in place for 2 weeks or longer. If adhesive strip edges start to loosen and curl up, you may trim the loose edges. Do not remove adhesive strips completely unless your health care provider tells you to do that.  Check your insertion site every day for signs of infection. Check for: ? Redness, swelling, or pain. ? Fluid or blood. ? Pus or a bad smell. ? Warmth.  Do not take baths, swim, or use a hot tub until your health care provider approves.  You may shower 24-48 hours after the procedure, or as directed by your health care provider. ? Remove the dressing and gently wash the site with plain soap and water. ? Pat the area dry with a clean towel. ? Do not rub the site. That could cause bleeding.  Do not apply powder or lotion to the site. Activity  For 24 hours after the procedure, or as directed by your health care provider: ? Do not flex or bend the affected arm. ? Do not push or pull heavy objects with the affected arm. ? Do not drive  yourself home from the hospital or clinic. You may drive 24 hours after the procedure unless your health care provider tells you not to. ? Do not operate machinery or power tools.  Do not lift anything that is heavier than 10 lb (4.5 kg), or the limit that you are told, until your health care provider says that it is safe.  Ask your health care provider when it is okay to: ? Return to work or school. ? Resume usual physical activities or sports. ? Resume sexual activity.   General instructions  If the catheter site starts to bleed, raise your arm and put firm pressure on the site. If the bleeding does not stop, get help right away. This is a medical emergency.  If you went home on the same day as your procedure, a responsible adult should be with you for the first 24 hours after you arrive home.  Keep all follow-up visits as told by your health care provider. This is important. Contact a health care provider if:  You have a fever.  You have redness, swelling, or yellow drainage around your insertion site. Get help right away if:  You have unusual pain at the radial site.  The catheter insertion area swells very fast.  The insertion area is bleeding, and the bleeding does not stop when you hold steady pressure on the area.  Your arm or hand becomes pale, cool, tingly, or numb. These symptoms may represent a serious   problem that is an emergency. Do not wait to see if the symptoms will go away. Get medical help right away. Call your local emergency services (911 in the [place] Do not drive yourself to the hospital. Summary  After the procedure, it is common to have bruising and tenderness at the site.  Follow instructions from your health care provider about how to take care of your radial site wound. Check the wound every day for signs of infection.  Do not lift anything that is heavier than 10 lb (4.5 kg), or the limit that you are told, until your health care provider says that it  is safe. This information is not intended to replace advice given to you by your health care provider. Make sure you discuss any questions you have with your health care provider. Document Revised: 10/13/2017 Document Reviewed: 10/13/2017 Elsevier Patient Education  2021 Elsevier Inc.  

## 2021-01-06 ENCOUNTER — Other Ambulatory Visit: Payer: Self-pay

## 2021-01-06 MED ORDER — ROSUVASTATIN CALCIUM 40 MG PO TABS: 40.0000 mg | ORAL_TABLET | Freq: Every day | ORAL | 3 refills | Status: DC

## 2021-01-08 DIAGNOSIS — C61 Malignant neoplasm of prostate: Secondary | ICD-10-CM | POA: Diagnosis not present

## 2021-01-15 DIAGNOSIS — C61 Malignant neoplasm of prostate: Secondary | ICD-10-CM | POA: Diagnosis not present

## 2021-02-24 ENCOUNTER — Other Ambulatory Visit: Payer: Medicare HMO | Admitting: *Deleted

## 2021-02-24 ENCOUNTER — Other Ambulatory Visit: Payer: Self-pay

## 2021-02-24 DIAGNOSIS — I2510 Atherosclerotic heart disease of native coronary artery without angina pectoris: Secondary | ICD-10-CM | POA: Diagnosis not present

## 2021-02-24 DIAGNOSIS — I4510 Unspecified right bundle-branch block: Secondary | ICD-10-CM | POA: Diagnosis not present

## 2021-02-24 LAB — LIPID PANEL
Chol/HDL Ratio: 1.7 ratio (ref 0.0–5.0)
Cholesterol, Total: 129 mg/dL (ref 100–199)
HDL: 75 mg/dL (ref >39)
LDL Chol Calc (NIH): 38 mg/dL (ref 0–99)
Triglycerides: 86 mg/dL (ref 0–149)
VLDL Cholesterol Cal: 16 mg/dL (ref 5–40)

## 2021-02-24 LAB — ALT: ALT: 62 IU/L — ABNORMAL HIGH (ref 0–44)

## 2021-04-21 DIAGNOSIS — L819 Disorder of pigmentation, unspecified: Secondary | ICD-10-CM | POA: Diagnosis not present

## 2021-04-21 DIAGNOSIS — D1801 Hemangioma of skin and subcutaneous tissue: Secondary | ICD-10-CM | POA: Diagnosis not present

## 2021-04-21 DIAGNOSIS — L720 Epidermal cyst: Secondary | ICD-10-CM | POA: Diagnosis not present

## 2021-04-21 DIAGNOSIS — L821 Other seborrheic keratosis: Secondary | ICD-10-CM | POA: Diagnosis not present

## 2021-04-21 DIAGNOSIS — I8393 Asymptomatic varicose veins of bilateral lower extremities: Secondary | ICD-10-CM | POA: Diagnosis not present

## 2021-04-21 DIAGNOSIS — L814 Other melanin hyperpigmentation: Secondary | ICD-10-CM | POA: Diagnosis not present

## 2021-04-21 DIAGNOSIS — D229 Melanocytic nevi, unspecified: Secondary | ICD-10-CM | POA: Diagnosis not present

## 2021-06-01 ENCOUNTER — Other Ambulatory Visit: Payer: Self-pay | Admitting: Physician Assistant

## 2021-06-09 ENCOUNTER — Encounter: Payer: Self-pay | Admitting: Cardiology

## 2021-06-09 ENCOUNTER — Other Ambulatory Visit: Payer: Self-pay

## 2021-06-09 ENCOUNTER — Ambulatory Visit: Payer: Medicare HMO | Admitting: Cardiology

## 2021-06-09 DIAGNOSIS — I10 Essential (primary) hypertension: Secondary | ICD-10-CM | POA: Insufficient documentation

## 2021-06-09 DIAGNOSIS — I340 Nonrheumatic mitral (valve) insufficiency: Secondary | ICD-10-CM | POA: Diagnosis not present

## 2021-06-09 DIAGNOSIS — C159 Malignant neoplasm of esophagus, unspecified: Secondary | ICD-10-CM | POA: Diagnosis not present

## 2021-06-09 DIAGNOSIS — I4510 Unspecified right bundle-branch block: Secondary | ICD-10-CM | POA: Insufficient documentation

## 2021-06-09 DIAGNOSIS — E782 Mixed hyperlipidemia: Secondary | ICD-10-CM

## 2021-06-09 DIAGNOSIS — I2510 Atherosclerotic heart disease of native coronary artery without angina pectoris: Secondary | ICD-10-CM

## 2021-06-09 DIAGNOSIS — E785 Hyperlipidemia, unspecified: Secondary | ICD-10-CM | POA: Insufficient documentation

## 2021-06-09 MED ORDER — METOPROLOL TARTRATE 25 MG PO TABS: 25.0000 mg | ORAL_TABLET | Freq: Two times a day (BID) | ORAL | 3 refills | Status: DC

## 2021-06-09 NOTE — Patient Instructions (Signed)
Medication Instructions:  The current medical regimen is effective;  continue present plan and medications.  *If you need a refill on your cardiac medications before your next appointment, please call your pharmacy*  Follow-Up: At [facility], you and your health needs are our priority.  As part of our continuing mission to provide you with exceptional heart care, we have created designated Provider Care Teams.  These Care Teams include your primary Cardiologist (physician) and Advanced Practice Providers (APPs -  Physician Assistants and Nurse Practitioners) who all work together to provide you with the care you need, when you need it.  We recommend signing up for the patient portal called "MyChart".  Sign up information is provided on this After Visit Summary.  MyChart is used to connect with patients for Virtual Visits (Telemedicine).  Patients are able to view lab/test results, encounter notes, upcoming appointments, etc.  Non-urgent messages can be sent to your provider as well.   To learn more about what you can do with MyChart, go to [email or web address]://www.mychart.com.    Your next appointment:   1 year(s)  The format for your next appointment:   In Person  Provider:   Mark Skains, MD   Thank you for choosing [facility]!!    

## 2021-06-09 NOTE — Assessment & Plan Note (Signed)
Occluded right coronary artery with collateral blood flow.  Cardiac catheterization from 2022 reviewed.  Continue with aggressive secondary risk factor/goal-directed medical therapy.  He is on aspirin 81 mg metoprolol 25 mg twice a day for antianginal support, Crestor 40 mg daily, high intensity statin.  Feels well exercising 3 days a week.  No anginal symptoms.  Refill metoprolol.

## 2021-06-09 NOTE — Assessment & Plan Note (Signed)
Currently well controlled on current medications, irbesartan 150 mg a day, metoprolol 25 mg a day.  No changes.

## 2021-06-09 NOTE — Assessment & Plan Note (Signed)
Mild mitral regurgitation seen on echocardiogram.  Murmur heard on exam.  Also has aortic sclerosis as well with no stenosis.  Ejection fraction 50%.

## 2021-06-09 NOTE — Assessment & Plan Note (Signed)
LDL 38 triglycerides 86.  Excellent.  Continue with Crestor 40 mg, no myalgias.  Last ALT mildly elevated at 62.  Continue to monitor.

## 2021-06-09 NOTE — Assessment & Plan Note (Signed)
Followed by Dr. Paulita Fujita of gastroenterology.  Prior surgical procedure by Dr. Jearld Fenton several years ago.  Stable.

## 2021-06-09 NOTE — Assessment & Plan Note (Signed)
Stable, no syncope, no changes.

## 2021-06-09 NOTE — Progress Notes (Signed)
Cardiology Office Note:    Date:  06/09/2021   ID:  [name], DOB September 03, 1946, MRN [id number]  PCP:  Lawerance Cruel, MD   [facility] Providers Cardiologist:  Candee Furbish, MD     Referring MD: Lawerance Cruel, MD     History of Present Illness:    [name] is a 75 y.o. male here for follow-up of coronary artery disease.  Cardiac catheterization 2022, no PCI.  Collateral blood flow to an occluded RCA.  Overall he is doing quite well exercising 3 days a week.  No anginal symptoms.  Taking medications without side effects.  Going on his second cruise to [place] which would then go down [employer] [place] and [place].   Past Medical History:  Diagnosis Date   Abdominal discomfort    Dysrhythmia    occ pac   Esophageal cancer ([place])    and prostate   GERD (gastroesophageal reflux disease)    Hoarseness    Wears hearing aid     Past Surgical History:  Procedure Laterality Date   DIRECT LARYNGOSCOPY WITH RADIAESSE INJECTION  02/08/2012   Procedure: DIRECT LARYNGOSCOPY WITH RADIAESSE INJECTION;  Surgeon: Jodi Marble, MD;  Location: [place];  Service: ENT;  Laterality: Bilateral;  microdirect-laryngosocopy  with bilateral vocal cord radiesse injection   DIRECT LARYNGOSCOPY WITH RADIAESSE INJECTION  03/21/2012   Procedure: DIRECT LARYNGOSCOPY WITH RADIAESSE INJECTION;  Surgeon: Jodi Marble, MD;  Location: [place];  Service: ENT;  Laterality: N/A;  microdirect laryngoscopy with radiaesse injection left vocal cord   DIRECT LARYNGOSCOPY WITH RADIAESSE INJECTION N/A 01/23/2013   Procedure: DIRECT LARYNGOSCOPY WITH RADIESSE VOCAL CORD AUGMENTATION LARYNGOPLASTY;  Surgeon: Jodi Marble, MD;  Location: [place];  Service: ENT;  Laterality: N/A;   ESOPHAGECTOMY  10/02/2010   Dr.Burney   ESOPHAGOGASTRODUODENOSCOPY N/A 09/24/2015   Procedure: ESOPHAGOGASTRODUODENOSCOPY (EGD);  Surgeon: Teena Irani, MD;   Location: [facility];  Service: Endoscopy;  Laterality: N/A;   ESOPHAGOGASTRODUODENOSCOPY N/A 12/12/2015   Procedure: ESOPHAGOGASTRODUODENOSCOPY (EGD);  Surgeon: Clarene Essex, MD;  Location: [facility];  Service: Endoscopy;  Laterality: N/A;   ESOPHAGOSCOPY  03/21/2012   Procedure: ESOPHAGOSCOPY;  Surgeon: Jodi Marble, MD;  Location: [place];  Service: ENT;  Laterality: N/A;   JEJUNOSTOMY  09/29/2010   Dr.Burney   LARYNGOPLASTY  2016   LEFT HEART CATH AND CORONARY ANGIOGRAPHY N/A 12/05/2020   Procedure: LEFT HEART CATH AND CORONARY ANGIOGRAPHY;  Surgeon: Troy Sine, MD;  Location: [place] [facility];  Service: Cardiovascular;  Laterality: N/A;   MICROLARYNGOSCOPY W/VOCAL CORD INJECTION  08/01/2012   Procedure: MICROLARYNGOSCOPY WITH VOCAL CORD INJECTION;  Surgeon: Jodi Marble, MD;  Location: [place];  Service: ENT;  Laterality: Left;  MICRO DIRECT LARYNGOSCOPY  AND RADIESSE  INJECTION    MICROLARYNGOSCOPY W/VOCAL CORD INJECTION N/A 08/07/2013   Procedure: MICROLARYNGOSCOPY WITH RADIESSE VOCAL CORD AUGMENTATION ;  Surgeon: Jodi Marble, MD;  Location: [place];  Service: ENT;  Laterality: N/A;   MICROLARYNGOSCOPY W/VOCAL CORD INJECTION N/A 02/19/2014   Procedure: MICROLARYNGOSCOPY WITH BILATERAL RADIESSE  VOCAL CORD AUGMENTATION;  Surgeon: Jodi Marble, MD;  Location: [place];  Service: ENT;  Laterality: N/A;   POSTERIOR CERVICAL FUSION/FORAMINOTOMY N/A 06/20/2018   Procedure: POSTERIOR CERVICAL LAMINECTOMY, POSTERIOR INSTRUMENTATION AND FUSION CERVICAL THREE-FOUR, CERVICAL FOUR-FIVE;  Surgeon: Newman Pies, MD;  Location: [place];  Service: Neurosurgery;  Laterality: N/A;  POSTERIOR CERVICAL LAMINECTOMY,  POSTERIOR INSTRUMENTATION AND FUSION CERVICAL THREE-FOUR, CERVICAL FOUR-FIVE   PYLOROPLASTY  09/29/2010   Dr.Burney    Current Medications: Current Meds  Medication Sig   ARTIFICIAL TEAR SOLUTION OP Place 1 drop  into both eyes daily as needed (dry eyes).   aspirin EC 81 MG tablet Take 1 tablet (81 mg total) by mouth daily. Swallow whole.   cetirizine (ZYRTEC) 10 MG tablet Take 10 mg by mouth daily as needed for allergies.   diphenhydrAMINE HCl (ZZZQUIL) 50 MG/30ML LIQD Take 25 mg by mouth at bedtime as needed (sleep).   Fe Bisgly-Succ-C-Thre-B12-FA (IRON-150 PO) Take 150 mg by mouth.   fluticasone (FLONASE) 50 MCG/ACT nasal spray Place 1 spray into both nostrils daily as needed for allergies or rhinitis.   irbesartan (AVAPRO) 150 MG tablet Take 150 mg by mouth daily.   Multiple Vitamin (MULTIVITAMIN WITH MINERALS) TABS tablet Take 1 tablet by mouth daily.   neomycin-bacitracin-polymyxin (NEOSPORIN) ointment Apply 1 application topically as needed for wound care.   omeprazole (PRILOSEC) 40 MG capsule Take 40 mg by mouth daily.    OVER THE COUNTER MEDICATION Take 2 capsules by mouth at bedtime as needed (sleep). Hemp gummies   pramipexole (MIRAPEX) 1 MG tablet Take 4 mg by mouth at bedtime.   rosuvastatin (CRESTOR) 40 MG tablet Take 1 tablet (40 mg total) by mouth daily.   sildenafil (REVATIO) 20 MG tablet Take 40-60 mg by mouth daily as needed (erectile dysfunction).   tiZANidine (ZANAFLEX) 2 MG tablet Take 4 mg by mouth daily as needed for muscle spasms.   triamcinolone cream (KENALOG) 0.1 % Apply 1 application topically daily as needed (rash).   TURMERIC-GINGER PO Take 2 tablets by mouth daily.   [DISCONTINUED] metoprolol tartrate (LOPRESSOR) 25 MG tablet Take 1 tablet (25 mg total) by mouth 2 (two) times daily. Please keep upcoming appt. To receive future refills.     Allergies:   Patient has no known allergies.   Social History   Socioeconomic History   Marital status: Married    Spouse name: Not on file   Number of children: Not on file   Years of education: Not on file   Highest education level: Not on file  Occupational History   Not on file  Tobacco Use   Smoking status: Former     Types: Cigarettes    Quit date: 06/13/2009    Years since quitting: 11.9   Smokeless tobacco: Never  Substance and Sexual Activity   Alcohol use: Yes    Alcohol/week: 10.0 standard drinks    Types: 10 Standard drinks or equivalent per week    Comment: avg 10 Barnabas Lister Daniels/week   Drug use: No   Sexual activity: Not on file  Other Topics Concern   Not on file  Social History Narrative   Not on file   Social Determinants of Health   Financial Resource Strain: Not on file  Food Insecurity: Not on file  Transportation Needs: Not on file  Physical Activity: Not on file  Stress: Not on file  Social Connections: Not on file     Family History: The patient's family history includes Heart attack in his father.  ROS:   Please see the history of present illness.     All other systems reviewed and are negative.  EKGs/Labs/Other Studies Reviewed:    The following studies were reviewed today:  11/1720 Cath: Prox Cx lesion is 20% stenosed. Mid Cx lesion is 20% stenosed. Prox LAD lesion is 25% stenosed.  1st Diag lesion is 30% stenosed. Ost RCA to Prox RCA lesion is 60% stenosed. Prox RCA-1 lesion is 80% stenosed. Prox RCA-2 lesion is 95% stenosed. Prox RCA to Mid RCA lesion is 100% stenosed.   There is evidence for multivessel coronary calcification, most prominent in the RCA.   The right coronary artery is totally occluded with diffuse proximal to mid stenoses of 60%, 80% and 95% prior to total occlusion of the mid vessel.  The vessel is significantly calcified.  There is extensive left to right collateralization supplying the PDA, PLA, and retrograde filling of the RCA proximal to the acute margin,   Short normal left main which trifurcates into the LAD, large  ramus intermediate vessel and left circumflex vessel.  The LAD has mild calcification with 30% proximal stenosis.  There is 30% diffuse proximal stenosis in the first diagonal vessel.  The ramus vessel is free of significant  obstructive disease.  There is smooth 20% ostial and proximal circumflex stenosis.   LVEDP 11 mmHg   RECOMMENDATION: Medical therapy.  He will be started on low-dose beta-blocker therapy with plans for follow-up with Dr. Marlou Porch. Rosuvastatin 20 mg will be titrated to 40 mg.  Optimal blood pressure control and lipid management.  Diagnostic Dominance: Right   ECHO 11/25/20:   1. Left ventricular ejection fraction, by estimation, is 50 to 55%. The  left ventricle has low normal function. The left ventricle demonstrates  regional wall motion abnormalities with basal inferior hypokinesis. Left  ventricular diastolic parameters are   consistent with Grade I diastolic dysfunction (impaired relaxation).   2. Right ventricular systolic function is normal. The right ventricular  size is normal. Tricuspid regurgitation signal is inadequate for assessing  PA pressure.   3. The mitral valve is normal in structure. Mild mitral valve  regurgitation. No evidence of mitral stenosis. Moderate mitral annular  calcification.   4. The aortic valve is tricuspid. Aortic valve regurgitation is trivial.  Mild to moderate aortic valve sclerosis/calcification is present, without  any evidence of aortic stenosis.   5. The inferior vena cava is normal in size with greater than 50%  respiratory variability, suggesting right atrial pressure of 3 mmHg.   6. Frequent PACs    Recent Labs: 12/03/2020: BUN 15; Creatinine, Ser 1.20; Hemoglobin 13.8; Platelets 236; Potassium 4.8; Sodium 141 02/24/2021: ALT 62  Recent Lipid Panel    Component Value Date/Time   CHOL 129 02/24/2021 0835   TRIG 86 02/24/2021 0835   HDL 75 02/24/2021 0835   CHOLHDL 1.7 02/24/2021 0835   LDLCALC 38 02/24/2021 0835     Risk Assessment/Calculations:          Physical Exam:    VS:  BP 120/60 (BP Location: Left Arm, Patient Position: Sitting, Cuff Size: Normal)   Pulse 88   Ht 6' (1.829 m)   Wt 187 lb (84.8 kg)   SpO2 96%   BMI  25.36 kg/m     Wt Readings from Last 3 Encounters:  06/09/21 187 lb (84.8 kg)  12/05/20 185 lb (83.9 kg)  12/03/20 185 lb (83.9 kg)     GEN:  Well nourished, well developed in no acute distress HEENT: Normal NECK: No JVD; No carotid bruits LYMPHATICS: No lymphadenopathy CARDIAC: RRR, 2/6 systolic murmur, no rubs, gallops RESPIRATORY:  Clear to auscultation without rales, wheezing or rhonchi  ABDOMEN: Soft, non-tender, non-distended MUSCULOSKELETAL:  No edema; No deformity  SKIN: Warm and dry NEUROLOGIC:  Alert and oriented x 3 PSYCHIATRIC:  Normal affect   ASSESSMENT:    1. Coronary artery disease involving native coronary artery of native heart without angina pectoris   2. Malignant neoplasm of esophagus, unspecified location ([place])   3. Nonrheumatic mitral valve regurgitation   4. Right bundle branch block   5. Mixed hyperlipidemia   6. Essential hypertension    PLAN:    In order of problems listed above:  CAD (coronary artery disease) Occluded right coronary artery with collateral blood flow.  Cardiac catheterization from 2022 reviewed.  Continue with aggressive secondary risk factor/goal-directed medical therapy.  He is on aspirin 81 mg metoprolol 25 mg twice a day for antianginal support, Crestor 40 mg daily, high intensity statin.  Feels well exercising 3 days a week.  No anginal symptoms.  Refill metoprolol.  Esophageal cancer Followed by Dr. Paulita Fujita of gastroenterology.  Prior surgical procedure by Dr. Jearld Fenton several years ago.  Stable.  Mitral regurgitation Mild mitral regurgitation seen on echocardiogram.  Murmur heard on exam.  Also has aortic sclerosis as well with no stenosis.  Ejection fraction 50%.  Right bundle branch block Stable, no syncope, no changes.  Hyperlipidemia LDL 38 triglycerides 86.  Excellent.  Continue with Crestor 40 mg, no myalgias.  Last ALT mildly elevated at 62.  Continue to monitor.  Essential hypertension Currently well controlled  on current medications, irbesartan 150 mg a day, metoprolol 25 mg a day.  No changes.       Medication Adjustments/Labs and Tests Ordered: Current medicines are reviewed at length with the patient today.  Concerns regarding medicines are outlined above.  No orders of the defined types were placed in this encounter.  Meds ordered this encounter  Medications   metoprolol tartrate (LOPRESSOR) 25 MG tablet    Sig: Take 1 tablet (25 mg total) by mouth 2 (two) times daily.    Dispense:  180 tablet    Refill:  3     Patient Instructions  Medication Instructions:  The current medical regimen is effective;  continue present plan and medications.  *If you need a refill on your cardiac medications before your next appointment, please call your pharmacy*  Follow-Up: At [facility], you and your health needs are our priority.  As part of our continuing mission to provide you with exceptional heart care, we have created designated Provider Care Teams.  These Care Teams include your primary Cardiologist (physician) and Advanced Practice Providers (APPs -  Physician Assistants and Nurse Practitioners) who all work together to provide you with the care you need, when you need it.  We recommend signing up for the patient portal called "MyChart".  Sign up information is provided on this After Visit Summary.  MyChart is used to connect with patients for Virtual Visits (Telemedicine).  Patients are able to view lab/test results, encounter notes, upcoming appointments, etc.  Non-urgent messages can be sent to your provider as well.   To learn more about what you can do with MyChart, go to [email or web address].    Your next appointment:   1 year(s)  The format for your next appointment:   In Person  Provider:   Candee Furbish, MD  Thank you for choosing [facility]!!     Signed, Candee Furbish, MD  06/09/2021 8:33 AM    [place]

## 2021-09-10 DIAGNOSIS — Z0000 Encounter for general adult medical examination without abnormal findings: Secondary | ICD-10-CM | POA: Diagnosis not present

## 2021-09-10 DIAGNOSIS — D509 Iron deficiency anemia, unspecified: Secondary | ICD-10-CM | POA: Diagnosis not present

## 2021-09-10 DIAGNOSIS — I10 Essential (primary) hypertension: Secondary | ICD-10-CM | POA: Diagnosis not present

## 2021-09-10 DIAGNOSIS — Z79899 Other long term (current) drug therapy: Secondary | ICD-10-CM | POA: Diagnosis not present

## 2021-09-17 DIAGNOSIS — Z6826 Body mass index (BMI) 26.0-26.9, adult: Secondary | ICD-10-CM | POA: Diagnosis not present

## 2021-09-17 DIAGNOSIS — M7989 Other specified soft tissue disorders: Secondary | ICD-10-CM | POA: Diagnosis not present

## 2021-09-17 DIAGNOSIS — L03115 Cellulitis of right lower limb: Secondary | ICD-10-CM | POA: Diagnosis not present

## 2021-09-19 ENCOUNTER — Ambulatory Visit (HOSPITAL_COMMUNITY)
Admission: RE | Admit: 2021-09-19 | Discharge: 2021-09-19 | Disposition: A | Discharge status: Home / Self Care | Payer: Medicare HMO | Source: Ambulatory Visit | Attending: Home Modifications | Admitting: Home Modifications

## 2021-09-19 ENCOUNTER — Other Ambulatory Visit (HOSPITAL_COMMUNITY): Payer: Self-pay | Admitting: Home Modifications

## 2021-09-19 ENCOUNTER — Other Ambulatory Visit: Payer: Self-pay

## 2021-09-19 DIAGNOSIS — R609 Edema, unspecified: Secondary | ICD-10-CM

## 2021-09-19 NOTE — Progress Notes (Signed)
Lower extremity venous has been completed.   Preliminary results in CV Proc.   [name] 09/19/2021 3:26 PM

## 2021-09-23 DIAGNOSIS — Z0000 Encounter for general adult medical examination without abnormal findings: Secondary | ICD-10-CM | POA: Diagnosis not present

## 2021-09-23 DIAGNOSIS — K219 Gastro-esophageal reflux disease without esophagitis: Secondary | ICD-10-CM | POA: Diagnosis not present

## 2021-09-23 DIAGNOSIS — D509 Iron deficiency anemia, unspecified: Secondary | ICD-10-CM | POA: Diagnosis not present

## 2021-09-23 DIAGNOSIS — G2581 Restless legs syndrome: Secondary | ICD-10-CM | POA: Diagnosis not present

## 2021-09-23 DIAGNOSIS — L03115 Cellulitis of right lower limb: Secondary | ICD-10-CM | POA: Diagnosis not present

## 2021-09-23 DIAGNOSIS — Z79899 Other long term (current) drug therapy: Secondary | ICD-10-CM | POA: Diagnosis not present

## 2021-09-23 DIAGNOSIS — I10 Essential (primary) hypertension: Secondary | ICD-10-CM | POA: Diagnosis not present

## 2021-09-23 DIAGNOSIS — M542 Cervicalgia: Secondary | ICD-10-CM | POA: Diagnosis not present

## 2021-10-13 DIAGNOSIS — L03115 Cellulitis of right lower limb: Secondary | ICD-10-CM | POA: Diagnosis not present

## 2021-10-14 DIAGNOSIS — H524 Presbyopia: Secondary | ICD-10-CM | POA: Diagnosis not present

## 2021-10-14 DIAGNOSIS — Z961 Presence of intraocular lens: Secondary | ICD-10-CM | POA: Diagnosis not present

## 2021-12-12 ENCOUNTER — Telehealth: Payer: Self-pay | Admitting: *Deleted

## 2021-12-12 NOTE — Telephone Encounter (Signed)
Left message for pt to call back to schedule f/u lung screening CT scan.  ?

## 2021-12-16 ENCOUNTER — Other Ambulatory Visit: Payer: Self-pay | Admitting: *Deleted

## 2021-12-16 DIAGNOSIS — Z87891 Personal history of nicotine dependence: Secondary | ICD-10-CM

## 2021-12-25 ENCOUNTER — Ambulatory Visit (INDEPENDENT_AMBULATORY_CARE_PROVIDER_SITE_OTHER)
Admission: RE | Admit: 2021-12-25 | Discharge: 2021-12-25 | Disposition: A | Discharge status: Home / Self Care | Payer: Medicare Other | Source: Ambulatory Visit | Attending: Acute Care | Admitting: Acute Care

## 2021-12-25 DIAGNOSIS — Z87891 Personal history of nicotine dependence: Secondary | ICD-10-CM

## 2021-12-29 ENCOUNTER — Other Ambulatory Visit: Payer: Self-pay

## 2021-12-29 DIAGNOSIS — Z87891 Personal history of nicotine dependence: Secondary | ICD-10-CM

## 2021-12-29 DIAGNOSIS — Z122 Encounter for screening for malignant neoplasm of respiratory organs: Secondary | ICD-10-CM

## 2022-01-05 ENCOUNTER — Other Ambulatory Visit: Payer: Self-pay | Admitting: *Deleted

## 2022-01-05 MED ORDER — ROSUVASTATIN CALCIUM 40 MG PO TABS: 40.0000 mg | ORAL_TABLET | Freq: Every day | ORAL | 1 refills | Status: DC

## 2022-01-08 ENCOUNTER — Other Ambulatory Visit: Payer: Self-pay

## 2022-01-08 DIAGNOSIS — R609 Edema, unspecified: Secondary | ICD-10-CM

## 2022-01-13 ENCOUNTER — Ambulatory Visit: Payer: Medicare Other | Admitting: Vascular Surgery

## 2022-01-13 ENCOUNTER — Encounter: Payer: Self-pay | Admitting: Vascular Surgery

## 2022-01-13 ENCOUNTER — Ambulatory Visit (HOSPITAL_COMMUNITY)
Admission: RE | Admit: 2022-01-13 | Discharge: 2022-01-13 | Disposition: A | Discharge status: Home / Self Care | Payer: Medicare Other | Source: Ambulatory Visit | Attending: Vascular Surgery | Admitting: Vascular Surgery

## 2022-01-13 DIAGNOSIS — R609 Edema, unspecified: Secondary | ICD-10-CM | POA: Diagnosis not present

## 2022-01-13 DIAGNOSIS — I872 Venous insufficiency (chronic) (peripheral): Secondary | ICD-10-CM | POA: Diagnosis not present

## 2022-01-13 NOTE — Progress Notes (Signed)
? ? ?Patient name: [name] MRN: [id number] DOB: May 29, 1946 Sex: male ? ?REASON FOR CONSULT: Evaluate recurrent edema and cellulitis of the right lower extremity ? ?HPI: ?[name] is a 76 y.o. male, with history of esophageal cancer and coronary artery disease that presents for evaluation of recurrent edema and cellulitis of the right lower extremity.  Sounds like he initially had an injury to the right calf when he was hanging Christmas ornaments this past year and ultimately developed cellulitis in the right lower extremity.  Ultimately this healed and then he had a recurrent episode earlier this year.  He has been put back on Lasix by his primary care provider.  Today symptoms have nearly completely resolved.  Really not having any pain at all other than some notable swelling.  No history of DVT. ? ?Past Medical History:  ?Diagnosis Date  ? Abdominal discomfort   ? Dysrhythmia   ? occ pac  ? Esophageal cancer ([place])   ? and prostate  ? GERD (gastroesophageal reflux disease)   ? Hoarseness   ? Wears hearing aid   ? ? ?Past Surgical History:  ?Procedure Laterality Date  ? DIRECT LARYNGOSCOPY WITH RADIAESSE INJECTION  02/08/2012  ? Procedure: DIRECT LARYNGOSCOPY WITH RADIAESSE INJECTION;  Surgeon: Jodi Marble, MD;  Location: [place];  Service: ENT;  Laterality: Bilateral;  microdirect-laryngosocopy  with bilateral vocal cord radiesse injection  ? DIRECT LARYNGOSCOPY WITH RADIAESSE INJECTION  03/21/2012  ? Procedure: DIRECT LARYNGOSCOPY WITH RADIAESSE INJECTION;  Surgeon: Jodi Marble, MD;  Location: [place];  Service: ENT;  Laterality: N/A;  microdirect laryngoscopy with radiaesse injection left vocal cord  ? DIRECT LARYNGOSCOPY WITH RADIAESSE INJECTION N/A 01/23/2013  ? Procedure: DIRECT LARYNGOSCOPY WITH RADIESSE VOCAL CORD AUGMENTATION LARYNGOPLASTY;  Surgeon: Jodi Marble, MD;  Location: [place];  Service: ENT;  Laterality: N/A;  ?  ESOPHAGECTOMY  10/02/2010  ? Dr.Burney  ? ESOPHAGOGASTRODUODENOSCOPY N/A 09/24/2015  ? Procedure: ESOPHAGOGASTRODUODENOSCOPY (EGD);  Surgeon: Teena Irani, MD;  [facility];  Service: Endoscopy;  Laterality: N/A;  ? ESOPHAGOGASTRODUODENOSCOPY N/A 12/12/2015  ? Procedure: ESOPHAGOGASTRODUODENOSCOPY (EGD);  Surgeon: Clarene Essex, MD;  Location[facility];  Service: Endoscopy;  Laterality: N/A;  ? ESOPHAGOSCOPY  03/21/2012  ? Procedure: ESOPHAGOSCOPY;  Surgeon: Jodi Marble, MD;  Location: [place];  Service: ENT;  Laterality: N/A;  ? JEJUNOSTOMY  09/29/2010  ? Dr.Burney  ? LARYNGOPLASTY  2016  ? LEFT HEART CATH AND CORONARY ANGIOGRAPHY N/A 12/05/2020  ? Procedure: LEFT HEART CATH AND CORONARY ANGIOGRAPHY;  Surgeon: Troy Sine, MD;  Location: [place] [facility];  Service: Cardiovascular;  Laterality: N/A;  ? MICROLARYNGOSCOPY W/VOCAL CORD INJECTION  08/01/2012  ? Procedure: MICROLARYNGOSCOPY WITH VOCAL CORD INJECTION;  Surgeon: Jodi Marble, MD;  Location: [place];  Service: ENT;  Laterality: Left;  MICRO DIRECT LARYNGOSCOPY  AND RADIESSE  INJECTION   ? MICROLARYNGOSCOPY W/VOCAL CORD INJECTION N/A 08/07/2013  ? Procedure: MICROLARYNGOSCOPY WITH RADIESSE VOCAL CORD AUGMENTATION ;  Surgeon: Jodi Marble, MD;  Location: [place];  Service: ENT;  Laterality: N/A;  ? MICROLARYNGOSCOPY W/VOCAL CORD INJECTION N/A 02/19/2014  ? Procedure: MICROLARYNGOSCOPY WITH BILATERAL RADIESSE  VOCAL CORD AUGMENTATION;  Surgeon: Jodi Marble, MD;  Location: [place];  Service: ENT;  Laterality: N/A;  ? POSTERIOR CERVICAL FUSION/FORAMINOTOMY N/A 06/20/2018  ? Procedure: POSTERIOR CERVICAL LAMINECTOMY, POSTERIOR INSTRUMENTATION AND FUSION CERVICAL THREE-FOUR, CERVICAL FOUR-FIVE;  Surgeon: Newman Pies, MD;  Location: [facility];  Service: Neurosurgery;  Laterality: N/A;  POSTERIOR CERVICAL LAMINECTOMY, POSTERIOR INSTRUMENTATION AND FUSION CERVICAL THREE-FOUR,  CERVICAL FOUR-FIVE  ? PYLOROPLASTY  09/29/2010  ? Dr.Burney  ? ? ?Family History  ?Problem Relation Age of Onset  ? Heart attack Father   ? ? ?SOCIAL HISTORY: ?Social History  ? ?Socioeconomic History  ? Marital status: Married  ?  Spouse name: Not on file  ? Number of children: Not on file  ? Years of education: Not on file  ? Highest education level: Not on file  ?Occupational History  ? Not on file  ?Tobacco Use  ? Smoking status: Former  ?  Types: Cigarettes  ?  Quit date: 06/13/2009  ?  Years since quitting: 12.5  ? Smokeless tobacco: Never  ?Substance and Sexual Activity  ? Alcohol use: Yes  ?  Alcohol/week: 10.0 standard drinks  ?  Types: 10 Standard drinks or equivalent per week  ?  Comment: avg 10 Barnabas Lister Daniels/week  ? Drug use: No  ? Sexual activity: Not on file  ?Other Topics Concern  ? Not on file  ?Social History Narrative  ? Not on file  ? ?Social Determinants of Health  ? ?Financial Resource Strain: Not on file  ?Food Insecurity: Not on file  ?Transportation Needs: Not on file  ?Physical Activity: Not on file  ?Stress: Not on file  ?Social Connections: Not on file  ?Intimate Partner Violence: Not on file  ? ? ?No Known Allergies ? ?Current Outpatient Medications  ?Medication Sig Dispense Refill  ? ARTIFICIAL TEAR SOLUTION OP Place 1 drop into both eyes daily as needed (dry eyes).    ? aspirin EC 81 MG tablet Take 1 tablet (81 mg total) by mouth daily. Swallow whole. 90 tablet 3  ? cetirizine (ZYRTEC) 10 MG tablet Take 10 mg by mouth daily as needed for allergies.    ? diphenhydrAMINE HCl (ZZZQUIL) 50 MG/30ML LIQD Take 25 mg by mouth at bedtime as needed (sleep).    ? Fe Bisgly-Succ-C-Thre-B12-FA (IRON-150 PO) Take 150 mg by mouth.    ? fluticasone (FLONASE) 50 MCG/ACT nasal spray Place 1 spray into both nostrils daily as needed for allergies or rhinitis.    ? furosemide (LASIX) 20 MG tablet Take 20 mg by mouth daily.    ? irbesartan (AVAPRO) 150 MG tablet Take 150 mg by mouth daily.    ? metoprolol  tartrate (LOPRESSOR) 25 MG tablet Take 1 tablet (25 mg total) by mouth 2 (two) times daily. 180 tablet 3  ? Multiple Vitamin (MULTIVITAMIN WITH MINERALS) TABS tablet Take 1 tablet by mouth daily.    ? neomycin-bacitracin-polymyxin (NEOSPORIN) ointment Apply 1 application topically as needed for wound care.    ? omeprazole (PRILOSEC) 40 MG capsule Take 40 mg by mouth daily.     ? pramipexole (MIRAPEX) 1 MG tablet Take 4 mg by mouth at bedtime.    ? rosuvastatin (CRESTOR) 40 MG tablet Take 1 tablet (40 mg total) by mouth daily. 90 tablet 1  ? sildenafil (REVATIO) 20 MG tablet Take 40-60 mg by mouth daily as needed (erectile dysfunction).    ? tiZANidine (ZANAFLEX) 2 MG tablet Take 4 mg by mouth daily as needed for muscle spasms.    ? triamcinolone cream (KENALOG) 0.1 % Apply 1 application topically daily as needed (rash).    ? TURMERIC-GINGER PO Take 2 tablets by mouth daily.    ? OVER THE COUNTER MEDICATION Take 2 capsules by mouth at bedtime as needed (sleep).  Hemp gummies (Patient not taking: Reported on 01/13/2022)    ? ?No current facility-administered medications for this visit.  ? ? ?REVIEW OF SYSTEMS:  ?'[X]'$  denotes positive finding, '[ ]'$  denotes negative finding ?Cardiac  Comments:  ?Chest pain or chest pressure:    ?Shortness of breath upon exertion:    ?Short of breath when lying flat:    ?Irregular heart rhythm:    ?    ?Vascular    ?Pain in calf, thigh, or hip brought on by ambulation:    ?Pain in feet at night that wakes you up from your sleep:     ?Blood clot in your veins:    ?Leg swelling:  x   ?    ?Pulmonary    ?Oxygen at home:    ?Productive cough:     ?Wheezing:     ?    ?Neurologic    ?Sudden weakness in arms or legs:     ?Sudden numbness in arms or legs:     ?Sudden onset of difficulty speaking or slurred speech:    ?Temporary loss of vision in one eye:     ?Problems with dizziness:     ?    ?Gastrointestinal    ?Blood in stool:     ?Vomited blood:     ?    ?Genitourinary    ?Burning when  urinating:     ?Blood in urine:    ?    ?Psychiatric    ?Major depression:     ?    ?Hematologic    ?Bleeding problems:    ?Problems with blood clotting too easily:    ?    ?Skin    ?Rashes or ulcers:    ?    ?Constitutional

## 2022-04-06 DIAGNOSIS — H6123 Impacted cerumen, bilateral: Secondary | ICD-10-CM | POA: Diagnosis not present

## 2022-05-14 DIAGNOSIS — R5383 Other fatigue: Secondary | ICD-10-CM | POA: Diagnosis not present

## 2022-05-14 DIAGNOSIS — R030 Elevated blood-pressure reading, without diagnosis of hypertension: Secondary | ICD-10-CM | POA: Diagnosis not present

## 2022-05-14 DIAGNOSIS — R0981 Nasal congestion: Secondary | ICD-10-CM | POA: Diagnosis not present

## 2022-06-25 NOTE — Progress Notes (Signed)
Cardiology Office Note:    Date:  06/26/2022   ID:  [name], DOB [birth year], MRN [id number]  PCP:  Lawerance Cruel, MD   [facility] Providers Cardiologist:  Candee Furbish, MD     Referring MD: Lawerance Cruel, MD    History of Present Illness:    [name] is a 76 y.o. male here for the follow-up of coronary artery disease.  Cardiac catheterization 2022, no PCI.  Collateral blood flow to an occluded RCA.    At his last appointment he was doing well and tolerating his medications. Going on his second cruise to [place] which would then go down the [place]. No medication changes were made.  Today: He states he is feeling well with no new cardiovascular concerns.  His prior esophageal issues have been stable.  For activity he routinely walks at least 3 days a week, and works in his yard.  He denies any palpitations, chest pain, shortness of breath, or peripheral edema. No lightheadedness, headaches, syncope, orthopnea, or PND.   Past Medical History:  Diagnosis Date   Abdominal discomfort    Dysrhythmia    occ pac   Esophageal cancer (HCC)    and prostate   GERD (gastroesophageal reflux disease)    Hoarseness    Wears hearing aid     Past Surgical History:  Procedure Laterality Date   DIRECT LARYNGOSCOPY WITH RADIAESSE INJECTION  02/08/2012   Procedure: DIRECT LARYNGOSCOPY WITH RADIAESSE INJECTION;  Surgeon: Jodi Marble, MD;  Location: [place];  Service: ENT;  Laterality: Bilateral;  microdirect-laryngosocopy  with bilateral vocal cord radiesse injection   DIRECT LARYNGOSCOPY WITH RADIAESSE INJECTION  03/21/2012   Procedure: DIRECT LARYNGOSCOPY WITH RADIAESSE INJECTION;  Surgeon: Jodi Marble, MD;  Location: [place];  Service: ENT;  Laterality: N/A;  microdirect laryngoscopy with radiaesse injection left vocal cord   DIRECT LARYNGOSCOPY WITH RADIAESSE INJECTION N/A 01/23/2013    Procedure: DIRECT LARYNGOSCOPY WITH RADIESSE VOCAL CORD AUGMENTATION LARYNGOPLASTY;  Surgeon: Jodi Marble, MD;  Location: [place];  Service: ENT;  Laterality: N/A;   ESOPHAGECTOMY  10/02/2010   Dr.Burney   ESOPHAGOGASTRODUODENOSCOPY N/A 09/24/2015   Procedure: ESOPHAGOGASTRODUODENOSCOPY (EGD);  Surgeon: Teena Irani, MD;  Location: [facility];  Service: Endoscopy;  Laterality: N/A;   ESOPHAGOGASTRODUODENOSCOPY N/A 12/12/2015   Procedure: ESOPHAGOGASTRODUODENOSCOPY (EGD);  Surgeon: Clarene Essex, MD;  Location: [facility];  Service: Endoscopy;  Laterality: N/A;   ESOPHAGOSCOPY  03/21/2012   Procedure: ESOPHAGOSCOPY;  Surgeon: Jodi Marble, MD;  Location: [place];  Service: ENT;  Laterality: N/A;   JEJUNOSTOMY  09/29/2010   Dr.Burney   LARYNGOPLASTY  2016   LEFT HEART CATH AND CORONARY ANGIOGRAPHY N/A 12/05/2020   Procedure: LEFT HEART CATH AND CORONARY ANGIOGRAPHY;  Surgeon: Troy Sine, MD;  Location: [place] [facility];  Service: Cardiovascular;  Laterality: N/A;   MICROLARYNGOSCOPY W/VOCAL CORD INJECTION  08/01/2012   Procedure: MICROLARYNGOSCOPY WITH VOCAL CORD INJECTION;  Surgeon: Jodi Marble, MD;  Location: [place];  Service: ENT;  Laterality: Left;  MICRO DIRECT LARYNGOSCOPY  AND RADIESSE  INJECTION    MICROLARYNGOSCOPY W/VOCAL CORD INJECTION N/A 08/07/2013   Procedure: MICROLARYNGOSCOPY WITH RADIESSE VOCAL CORD AUGMENTATION ;  Surgeon: Jodi Marble, MD;  Location: [place];  Service: ENT;  Laterality: N/A;   MICROLARYNGOSCOPY W/VOCAL CORD INJECTION N/A 02/19/2014   Procedure: MICROLARYNGOSCOPY WITH BILATERAL RADIESSE  VOCAL CORD AUGMENTATION;  Surgeon:  Jodi Marble, MD;  Location: [place];  Service: ENT;  Laterality: N/A;   POSTERIOR CERVICAL FUSION/FORAMINOTOMY N/A 06/20/2018   Procedure: POSTERIOR CERVICAL LAMINECTOMY, POSTERIOR INSTRUMENTATION AND FUSION CERVICAL THREE-FOUR, CERVICAL FOUR-FIVE;   Surgeon: Newman Pies, MD;  Location: [place];  Service: Neurosurgery;  Laterality: N/A;  POSTERIOR CERVICAL LAMINECTOMY, POSTERIOR INSTRUMENTATION AND FUSION CERVICAL THREE-FOUR, CERVICAL FOUR-FIVE   PYLOROPLASTY  09/29/2010   Dr.Burney    Current Medications: Current Meds  Medication Sig   ARTIFICIAL TEAR SOLUTION OP Place 1 drop into both eyes daily as needed (dry eyes).   aspirin EC 81 MG tablet Take 1 tablet (81 mg total) by mouth daily. Swallow whole.   cetirizine (ZYRTEC) 10 MG tablet Take 10 mg by mouth daily as needed for allergies.   Fe Bisgly-Succ-C-Thre-B12-FA (IRON-150 PO) Take 150 mg by mouth.   fluticasone (FLONASE) 50 MCG/ACT nasal spray Place 1 spray into both nostrils daily as needed for allergies or rhinitis.   furosemide (LASIX) 20 MG tablet Take 20 mg by mouth daily.   irbesartan (AVAPRO) 150 MG tablet Take 150 mg by mouth daily.   metoprolol tartrate (LOPRESSOR) 25 MG tablet Take 1 tablet (25 mg total) by mouth 2 (two) times daily.   Multiple Vitamin (MULTIVITAMIN WITH MINERALS) TABS tablet Take 1 tablet by mouth daily.   neomycin-bacitracin-polymyxin (NEOSPORIN) ointment Apply 1 application topically as needed for wound care.   omeprazole (PRILOSEC) 40 MG capsule Take 40 mg by mouth daily.    pramipexole (MIRAPEX) 1 MG tablet Take 4 mg by mouth at bedtime.   rosuvastatin (CRESTOR) 40 MG tablet Take 1 tablet (40 mg total) by mouth daily.   sildenafil (REVATIO) 20 MG tablet Take 40-60 mg by mouth daily as needed (erectile dysfunction).   triamcinolone cream (KENALOG) 0.1 % Apply 1 application topically daily as needed (rash).   TURMERIC-GINGER PO Take 2 tablets by mouth daily.     Allergies:   Patient has no known allergies.   Social History   Socioeconomic History   Marital status: Married    Spouse name: Not on file   Number of children: Not on file   Years of education: Not on file   Highest education level: Not on file  Occupational History   Not on  file  Tobacco Use   Smoking status: Former    Types: Cigarettes    Quit date: 06/13/2009    Years since quitting: 13.0   Smokeless tobacco: Never  Substance and Sexual Activity   Alcohol use: Yes    Alcohol/week: 10.0 standard drinks of alcohol    Types: 10 Standard drinks or equivalent per week    Comment: avg 10 Barnabas Lister Daniels/week   Drug use: No   Sexual activity: Not on file  Other Topics Concern   Not on file  Social History Narrative   Not on file   Social Determinants of Health   Financial Resource Strain: Not on file  Food Insecurity: Not on file  Transportation Needs: Not on file  Physical Activity: Not on file  Stress: Not on file  Social Connections: Not on file     Family History: The patient's family history includes Heart attack in his father.  ROS:   Please see the history of present illness.    All other systems reviewed and are negative.  EKGs/Labs/Other Studies Reviewed:    The following studies were reviewed today:  Right LE Venous Doppler  01/13/2022: Summary:  Right:  - No evidence of  deep vein thrombosis from the common femoral through the  popliteal veins.  - No evidence of superficial venous thrombosis.  - The popliteal vein is not competent.  - The great saphenous vein is not competent in the proximal calf only.  - The small saphenous vein is competent.   CT Chest  12/25/2021: FINDINGS: Cardiovascular: Normal heart size. No significant pericardial effusion/thickening. Three vessel coronary atherosclerosis. Atherosclerotic nonaneurysmal thoracic aorta. Normal caliber pulmonary arteries.   Mediastinum/Nodes: No discrete thyroid nodules. Stable postsurgical changes from esophagectomy with gastric pull-through. No pathologically enlarged axillary, mediastinal or hilar lymph nodes, noting limited sensitivity for the detection of hilar adenopathy on this noncontrast study.   Lungs/Pleura: No pneumothorax. No pleural effusion.  Mild centrilobular emphysema with diffuse bronchial wall thickening. No acute consolidative airspace disease or lung masses. No significant growth of previously visualized pulmonary nodules. No new significant pulmonary nodules.   Upper abdomen: No acute abnormality.   Musculoskeletal: No aggressive appearing focal osseous lesions. Mild thoracic spondylosis.   IMPRESSION: 1. Lung-RADS 2, benign appearance or behavior. Continue annual screening with low-dose chest CT without contrast in 12 months. 2. Three vessel coronary atherosclerosis. 3. Stable postsurgical changes from esophagectomy with gastric pull-through. 4. Aortic Atherosclerosis (ICD10-I70.0) and Emphysema (ICD10-J43.9).  12/05/20 Cath: Prox Cx lesion is 20% stenosed. Mid Cx lesion is 20% stenosed. Prox LAD lesion is 25% stenosed. 1st Diag lesion is 30% stenosed. Ost RCA to Prox RCA lesion is 60% stenosed. Prox RCA-1 lesion is 80% stenosed. Prox RCA-2 lesion is 95% stenosed. Prox RCA to Mid RCA lesion is 100% stenosed.   There is evidence for multivessel coronary calcification, most prominent in the RCA.   The right coronary artery is totally occluded with diffuse proximal to mid stenoses of 60%, 80% and 95% prior to total occlusion of the mid vessel.  The vessel is significantly calcified.  There is extensive left to right collateralization supplying the PDA, PLA, and retrograde filling of the RCA proximal to the acute margin,   Short normal left main which trifurcates into the LAD, large  ramus intermediate vessel and left circumflex vessel.  The LAD has mild calcification with 30% proximal stenosis.  There is 30% diffuse proximal stenosis in the first diagonal vessel.  The ramus vessel is free of significant obstructive disease.  There is smooth 20% ostial and proximal circumflex stenosis.   LVEDP 11 mmHg   RECOMMENDATION: Medical therapy.  He will be started on low-dose beta-blocker therapy with plans for follow-up  with Dr. Marlou Porch. Rosuvastatin 20 mg will be titrated to 40 mg.  Optimal blood pressure control and lipid management.  Diagnostic Dominance: Right   Nuclear Stress Test  12/02/2020: The left ventricular ejection fraction is mildly decreased (45-54%). Nuclear stress EF: 48%. Mild inferior wall hypokinesis There was no ST segment deviation noted during stress. Defect 1: There is a large defect of severe severity present in the basal inferoseptal, basal inferior and mid inferior location. Findings consistent with prior myocardial infarction with peri-infarct ischemia. This is an intermediate risk study.   We will have him come in to discuss cardiac catheterization to further define anatomy given the inferior infarct pattern.  ECHO 11/25/20:  1. Left ventricular ejection fraction, by estimation, is 50 to 55%. The  left ventricle has low normal function. The left ventricle demonstrates  regional wall motion abnormalities with basal inferior hypokinesis. Left  ventricular diastolic parameters are   consistent with Grade I diastolic dysfunction (impaired relaxation).   2. Right ventricular systolic  function is normal. The right ventricular  size is normal. Tricuspid regurgitation signal is inadequate for assessing  PA pressure.   3. The mitral valve is normal in structure. Mild mitral valve  regurgitation. No evidence of mitral stenosis. Moderate mitral annular  calcification.   4. The aortic valve is tricuspid. Aortic valve regurgitation is trivial.  Mild to moderate aortic valve sclerosis/calcification is present, without  any evidence of aortic stenosis.   5. The inferior vena cava is normal in size with greater than 50%  respiratory variability, suggesting right atrial pressure of 3 mmHg.   6. Frequent PACs   EKG:  EKG is personally reviewed. 06/26/2022:  Sinus rhythm. Rate 60 bpm. RBBB. Inferior infarct pattern.  Recent Labs: No results found for requested labs within last 365 days.    Recent Lipid Panel    Component Value Date/Time   CHOL 129 02/24/2021 0835   TRIG 86 02/24/2021 0835   HDL 75 02/24/2021 0835   CHOLHDL 1.7 02/24/2021 0835   LDLCALC 38 02/24/2021 0835     Risk Assessment/Calculations:          Physical Exam:    VS:  BP 130/80 (BP Location: Left Arm, Patient Position: Sitting, Cuff Size: Normal)   Pulse 60   Ht 6' (1.829 m)   Wt 191 lb (86.6 kg)   SpO2 98%   BMI 25.90 kg/m     Wt Readings from Last 3 Encounters:  06/26/22 191 lb (86.6 kg)  01/13/22 188 lb (85.3 kg)  06/09/21 187 lb (84.8 kg)     GEN:  Well nourished, well developed in no acute distress HEENT: Normal NECK: No JVD; No carotid bruits LYMPHATICS: No lymphadenopathy CARDIAC: RRR, 2/6 systolic murmur, no rubs, gallops RESPIRATORY:  Clear to auscultation without rales, wheezing or rhonchi  ABDOMEN: Soft, non-tender, non-distended MUSCULOSKELETAL:  No edema; No deformity  SKIN: Warm and dry NEUROLOGIC:  Alert and oriented x 3 PSYCHIATRIC:  Normal affect   ASSESSMENT:    1. Coronary artery disease involving native coronary artery of native heart without angina pectoris   2. Malignant neoplasm of esophagus, unspecified location ([place])   3. Right bundle branch block     PLAN:    In order of problems listed above:  CAD (coronary artery disease) Occluded right coronary artery with collateral blood flow.  Cardiac catheterization from 2022 reviewed.  Continue with aggressive secondary risk factor/goal-directed medical therapy.  He is on aspirin 81 mg metoprolol 25 mg twice a day for antianginal support, Crestor 40 mg daily, high intensity statin.  Feels well exercising 3 days a week.  No anginal symptoms.  Playing golf.  Walking frequently.  No anginal symptoms.   Esophageal cancer Followed by Dr. Paulita Fujita of gastroenterology.  Prior surgical procedure by Dr. Jearld Fenton several years ago.  Stable.  No recurrence.   Mitral regurgitation Mild mitral regurgitation seen on  echocardiogram.  Murmur heard on exam.  Also has aortic sclerosis as well with no stenosis.  Ejection fraction 50%.  Continue to monitor.  No shortness of breath.   Right bundle branch block Stable, no syncope, no changes.  Discussed today pathology.   Hyperlipidemia LDL 38 excellent.  Triglycerides 86.  Continue with Crestor 40 mg, no myalgias. Continue to monitor.   Essential hypertension Currently well controlled on current medications, irbesartan 150 mg a day, metoprolol 25 mg a day.  No changes.  Doing well   Follow-up:  1 year.  Medication Adjustments/Labs and Tests Ordered: Current medicines are  reviewed at length with the patient today.  Concerns regarding medicines are outlined above.   Orders Placed This Encounter  Procedures   EKG 12-Lead   No orders of the defined types were placed in this encounter.  Patient Instructions  Medication Instructions:  The current medical regimen is effective;  continue present plan and medications.  *If you need a refill on your cardiac medications before your next appointment, please call your pharmacy*  Follow-Up: At [facility], you and your health needs are our priority.  As part of our continuing mission to provide you with exceptional heart care, we have created designated Provider Care Teams.  These Care Teams include your primary Cardiologist (physician) and Advanced Practice Providers (APPs -  Physician Assistants and Nurse Practitioners) who all work together to provide you with the care you need, when you need it.  We recommend signing up for the patient portal called "MyChart".  Sign up information is provided on this After Visit Summary.  MyChart is used to connect with patients for Virtual Visits (Telemedicine).  Patients are able to view lab/test results, encounter notes, upcoming appointments, etc.  Non-urgent messages can be sent to your provider as well.   To learn more about what you can do with MyChart, go to  [email or web address].    Your next appointment:   1 year(s)  The format for your next appointment:   In Person  Provider:   Candee Furbish, MD     Important Information About Sugar         I,Mathew Stumpf,acting as a scribe for Candee Furbish, MD.,have documented all relevant documentation on the behalf of Candee Furbish, MD,as directed by  Candee Furbish, MD while in the presence of Candee Furbish, MD.  I, Candee Furbish, MD, have reviewed all documentation for this visit. The documentation on 06/26/22 for the exam, diagnosis, procedures, and orders are all accurate and complete.   Signed, Candee Furbish, MD  06/26/2022 8:56 AM    [facility]

## 2022-06-26 ENCOUNTER — Ambulatory Visit: Payer: Medicare Other | Attending: Cardiology | Admitting: Cardiology

## 2022-06-26 ENCOUNTER — Encounter: Payer: Self-pay | Admitting: Cardiology

## 2022-06-26 VITALS — BP 130/80 | HR 60 | Ht 72.0 in | Wt 191.0 lb

## 2022-06-26 DIAGNOSIS — I4510 Unspecified right bundle-branch block: Secondary | ICD-10-CM | POA: Diagnosis not present

## 2022-06-26 DIAGNOSIS — I2510 Atherosclerotic heart disease of native coronary artery without angina pectoris: Secondary | ICD-10-CM

## 2022-06-26 DIAGNOSIS — C159 Malignant neoplasm of esophagus, unspecified: Secondary | ICD-10-CM

## 2022-06-26 NOTE — Patient Instructions (Signed)
Medication Instructions:  The current medical regimen is effective;  continue present plan and medications.  *If you need a refill on your cardiac medications before your next appointment, please call your pharmacy*  Follow-Up: At [facility], you and your health needs are our priority.  As part of our continuing mission to provide you with exceptional heart care, we have created designated Provider Care Teams.  These Care Teams include your primary Cardiologist (physician) and Advanced Practice Providers (APPs -  Physician Assistants and Nurse Practitioners) who all work together to provide you with the care you need, when you need it.  We recommend signing up for the patient portal called "MyChart".  Sign up information is provided on this After Visit Summary.  MyChart is used to connect with patients for Virtual Visits (Telemedicine).  Patients are able to view lab/test results, encounter notes, upcoming appointments, etc.  Non-urgent messages can be sent to your provider as well.   To learn more about what you can do with MyChart, go to [email or web address].    Your next appointment:   1 year(s)  The format for your next appointment:   In Person  Provider:   Mark Skains, MD      Important Information About Sugar       

## 2022-07-03 ENCOUNTER — Other Ambulatory Visit: Payer: Self-pay

## 2022-07-03 MED ORDER — ROSUVASTATIN CALCIUM 40 MG PO TABS: 40.0000 mg | ORAL_TABLET | Freq: Every day | ORAL | 3 refills | Status: DC

## 2022-08-24 ENCOUNTER — Other Ambulatory Visit: Payer: Self-pay

## 2022-08-24 MED ORDER — METOPROLOL TARTRATE 25 MG PO TABS
25.0000 mg | ORAL_TABLET | Freq: Two times a day (BID) | ORAL | 2 refills | Status: DC
Start: 2022-08-24 — End: 2023-05-20

## 2022-09-29 DIAGNOSIS — I10 Essential (primary) hypertension: Secondary | ICD-10-CM | POA: Diagnosis not present

## 2022-09-29 DIAGNOSIS — D509 Iron deficiency anemia, unspecified: Secondary | ICD-10-CM | POA: Diagnosis not present

## 2022-09-29 DIAGNOSIS — Z0000 Encounter for general adult medical examination without abnormal findings: Secondary | ICD-10-CM | POA: Diagnosis not present

## 2022-10-19 DIAGNOSIS — H524 Presbyopia: Secondary | ICD-10-CM | POA: Diagnosis not present

## 2022-10-19 DIAGNOSIS — Z961 Presence of intraocular lens: Secondary | ICD-10-CM | POA: Diagnosis not present

## 2022-10-30 DIAGNOSIS — Z23 Encounter for immunization: Secondary | ICD-10-CM | POA: Diagnosis not present

## 2022-10-30 DIAGNOSIS — M542 Cervicalgia: Secondary | ICD-10-CM | POA: Diagnosis not present

## 2022-10-30 DIAGNOSIS — K219 Gastro-esophageal reflux disease without esophagitis: Secondary | ICD-10-CM | POA: Diagnosis not present

## 2022-10-30 DIAGNOSIS — J449 Chronic obstructive pulmonary disease, unspecified: Secondary | ICD-10-CM | POA: Diagnosis not present

## 2022-10-30 DIAGNOSIS — Z0000 Encounter for general adult medical examination without abnormal findings: Secondary | ICD-10-CM | POA: Diagnosis not present

## 2022-10-30 DIAGNOSIS — N1831 Chronic kidney disease, stage 3a: Secondary | ICD-10-CM | POA: Diagnosis not present

## 2022-10-30 DIAGNOSIS — I10 Essential (primary) hypertension: Secondary | ICD-10-CM | POA: Diagnosis not present

## 2022-10-30 DIAGNOSIS — M179 Osteoarthritis of knee, unspecified: Secondary | ICD-10-CM | POA: Diagnosis not present

## 2022-10-30 DIAGNOSIS — D509 Iron deficiency anemia, unspecified: Secondary | ICD-10-CM | POA: Diagnosis not present

## 2022-10-30 DIAGNOSIS — I700 Atherosclerosis of aorta: Secondary | ICD-10-CM | POA: Diagnosis not present

## 2022-12-29 ENCOUNTER — Ambulatory Visit
Admission: RE | Admit: 2022-12-29 | Discharge: 2022-12-29 | Disposition: A | Discharge status: Home / Self Care | Payer: Medicare Other | Source: Ambulatory Visit | Attending: Family Medicine | Admitting: Family Medicine

## 2022-12-29 DIAGNOSIS — Z122 Encounter for screening for malignant neoplasm of respiratory organs: Secondary | ICD-10-CM

## 2022-12-29 DIAGNOSIS — I2510 Atherosclerotic heart disease of native coronary artery without angina pectoris: Secondary | ICD-10-CM | POA: Diagnosis not present

## 2022-12-29 DIAGNOSIS — J9811 Atelectasis: Secondary | ICD-10-CM | POA: Diagnosis not present

## 2022-12-29 DIAGNOSIS — J439 Emphysema, unspecified: Secondary | ICD-10-CM | POA: Diagnosis not present

## 2022-12-29 DIAGNOSIS — Z87891 Personal history of nicotine dependence: Secondary | ICD-10-CM | POA: Diagnosis not present

## 2022-12-31 ENCOUNTER — Telehealth: Payer: Self-pay | Admitting: Acute Care

## 2022-12-31 ENCOUNTER — Other Ambulatory Visit: Payer: Self-pay

## 2022-12-31 DIAGNOSIS — R911 Solitary pulmonary nodule: Secondary | ICD-10-CM

## 2022-12-31 DIAGNOSIS — Z122 Encounter for screening for malignant neoplasm of respiratory organs: Secondary | ICD-10-CM

## 2022-12-31 DIAGNOSIS — Z87891 Personal history of nicotine dependence: Secondary | ICD-10-CM

## 2022-12-31 NOTE — Telephone Encounter (Signed)
Have called the patient with the results of his low-dose screening CT.  His scan was read as a lung RADS 0.  There are numerous new centrilobular groundglass and solid nodules of the right lower lobe and right middle lobe, likely due to aspiration or infection.  The patient denies being sick, fevers, new cough ,increase in secretions or discolored secretions. We did discuss the fact that he had esophageal cancer and had some esophageal surgery done at [facility] in [place].  He states he did have swallow eval's but has not had one for up to about 4 years. Plan will be for a follow-up CT in 3 months to reevaluate.  I have encouraged the patient to go to his primary care doctor if he develops any signs and symptoms of infection. I have also encouraged him to touch base with his specialist in [place] to see if he needs additional evaluation of his swallow. There were incidental findings of severe coronary artery calcifications and aortic atherosclerosis.  The patient is on a statin and he is followed by Dr. Anne Fu cardiology. He had no further questions at completion of the call and is in agreement with a 75-month follow-up screening scan. Denise please place order for 27-month follow-up low-dose screening CT and fax results to PCP letting them know the plan of care. Thank you so much

## 2022-12-31 NOTE — Telephone Encounter (Signed)
Results/plan faxed to PCP and order placed for 3 months LDCT follow up for nodule

## 2023-01-07 DIAGNOSIS — K219 Gastro-esophageal reflux disease without esophagitis: Secondary | ICD-10-CM | POA: Diagnosis not present

## 2023-01-07 DIAGNOSIS — M542 Cervicalgia: Secondary | ICD-10-CM | POA: Diagnosis not present

## 2023-01-11 ENCOUNTER — Other Ambulatory Visit: Payer: Self-pay | Admitting: Adult Health

## 2023-01-11 DIAGNOSIS — R972 Elevated prostate specific antigen [PSA]: Secondary | ICD-10-CM

## 2023-02-19 ENCOUNTER — Ambulatory Visit
Admission: RE | Admit: 2023-02-19 | Discharge: 2023-02-19 | Disposition: A | Discharge status: Home / Self Care | Payer: Medicare Other | Source: Ambulatory Visit | Attending: Adult Health | Admitting: Adult Health

## 2023-02-19 DIAGNOSIS — K5730 Diverticulosis of large intestine without perforation or abscess without bleeding: Secondary | ICD-10-CM | POA: Diagnosis not present

## 2023-02-19 DIAGNOSIS — K4090 Unilateral inguinal hernia, without obstruction or gangrene, not specified as recurrent: Secondary | ICD-10-CM | POA: Diagnosis not present

## 2023-02-19 DIAGNOSIS — R972 Elevated prostate specific antigen [PSA]: Secondary | ICD-10-CM

## 2023-02-19 MED ORDER — GADOPICLENOL 0.5 MMOL/ML IV SOLN
8.0000 mL | Freq: Once | INTRAVENOUS | Status: AC | PRN
  Administered 2023-02-19: 8 mL via INTRAVENOUS

## 2023-03-01 DIAGNOSIS — U071 COVID-19: Secondary | ICD-10-CM | POA: Diagnosis not present

## 2023-03-17 DIAGNOSIS — L03115 Cellulitis of right lower limb: Secondary | ICD-10-CM | POA: Diagnosis not present

## 2023-03-24 DIAGNOSIS — L03115 Cellulitis of right lower limb: Secondary | ICD-10-CM | POA: Diagnosis not present

## 2023-04-06 ENCOUNTER — Ambulatory Visit
Admission: RE | Admit: 2023-04-06 | Discharge: 2023-04-06 | Disposition: A | Discharge status: Home / Self Care | Payer: Medicare Other | Source: Ambulatory Visit | Attending: Acute Care | Admitting: Acute Care

## 2023-04-06 DIAGNOSIS — Z789 Other specified health status: Secondary | ICD-10-CM | POA: Diagnosis not present

## 2023-04-06 DIAGNOSIS — J439 Emphysema, unspecified: Secondary | ICD-10-CM | POA: Diagnosis not present

## 2023-04-06 DIAGNOSIS — Z122 Encounter for screening for malignant neoplasm of respiratory organs: Secondary | ICD-10-CM | POA: Diagnosis not present

## 2023-04-06 DIAGNOSIS — Z87891 Personal history of nicotine dependence: Secondary | ICD-10-CM

## 2023-04-06 DIAGNOSIS — I700 Atherosclerosis of aorta: Secondary | ICD-10-CM | POA: Diagnosis not present

## 2023-04-06 DIAGNOSIS — Z0130 Encounter for examination of blood pressure without abnormal findings: Secondary | ICD-10-CM | POA: Diagnosis not present

## 2023-04-06 DIAGNOSIS — H6123 Impacted cerumen, bilateral: Secondary | ICD-10-CM | POA: Diagnosis not present

## 2023-04-06 DIAGNOSIS — R911 Solitary pulmonary nodule: Secondary | ICD-10-CM

## 2023-04-06 DIAGNOSIS — Z6824 Body mass index (BMI) 24.0-24.9, adult: Secondary | ICD-10-CM | POA: Diagnosis not present

## 2023-04-26 ENCOUNTER — Ambulatory Visit: Payer: Medicare Other

## 2023-04-26 ENCOUNTER — Ambulatory Visit: Payer: Medicare Other | Admitting: Radiation Oncology

## 2023-04-26 DIAGNOSIS — I10 Essential (primary) hypertension: Secondary | ICD-10-CM | POA: Diagnosis not present

## 2023-04-26 DIAGNOSIS — R600 Localized edema: Secondary | ICD-10-CM | POA: Diagnosis not present

## 2023-04-26 NOTE — Progress Notes (Signed)
GU Location of Tumor / Histology: Prostate Ca  If Prostate Cancer, Gleason Score is (3 + 4) and PSA is (9.08 on 01/05/2023)  Biopsies      Past/Anticipated interventions by urology, if any:     Past/Anticipated interventions by medical oncology, if any: NA  Weight changes, if any: No  IPSS:  4 SHIM: 5  Bowel/Bladder complaints, if any:  No  Nausea/Vomiting, if any: No  Pain issues, if any:  0/10  SAFETY ISSUES: Prior radiation? Yes, esophageal cancer 2011. Pacemaker/ICD? No Possible current pregnancy? Male Is the patient on methotrexate? No  Current Complaints / other details:

## 2023-04-27 ENCOUNTER — Ambulatory Visit
Admission: RE | Admit: 2023-04-27 | Discharge: 2023-04-27 | Disposition: A | Discharge status: Home / Self Care | Payer: Medicare Other | Source: Ambulatory Visit | Attending: Radiation Oncology | Admitting: Radiation Oncology

## 2023-04-27 ENCOUNTER — Encounter: Payer: Self-pay | Admitting: Radiation Oncology

## 2023-04-27 VITALS — BP 117/59 | HR 80 | Temp 97.2°F | Resp 20 | Ht 72.0 in | Wt 178.6 lb

## 2023-04-27 DIAGNOSIS — K219 Gastro-esophageal reflux disease without esophagitis: Secondary | ICD-10-CM | POA: Insufficient documentation

## 2023-04-27 DIAGNOSIS — Z923 Personal history of irradiation: Secondary | ICD-10-CM | POA: Diagnosis not present

## 2023-04-27 DIAGNOSIS — Z87891 Personal history of nicotine dependence: Secondary | ICD-10-CM | POA: Diagnosis not present

## 2023-04-27 DIAGNOSIS — Z791 Long term (current) use of non-steroidal anti-inflammatories (NSAID): Secondary | ICD-10-CM | POA: Diagnosis not present

## 2023-04-27 DIAGNOSIS — Z191 Hormone sensitive malignancy status: Secondary | ICD-10-CM | POA: Diagnosis not present

## 2023-04-27 DIAGNOSIS — J439 Emphysema, unspecified: Secondary | ICD-10-CM | POA: Insufficient documentation

## 2023-04-27 DIAGNOSIS — C61 Malignant neoplasm of prostate: Secondary | ICD-10-CM | POA: Diagnosis not present

## 2023-04-27 DIAGNOSIS — I2510 Atherosclerotic heart disease of native coronary artery without angina pectoris: Secondary | ICD-10-CM | POA: Insufficient documentation

## 2023-04-27 DIAGNOSIS — R918 Other nonspecific abnormal finding of lung field: Secondary | ICD-10-CM | POA: Insufficient documentation

## 2023-04-27 DIAGNOSIS — I700 Atherosclerosis of aorta: Secondary | ICD-10-CM | POA: Diagnosis not present

## 2023-04-27 DIAGNOSIS — Z8501 Personal history of malignant neoplasm of esophagus: Secondary | ICD-10-CM | POA: Insufficient documentation

## 2023-04-27 DIAGNOSIS — Z79899 Other long term (current) drug therapy: Secondary | ICD-10-CM | POA: Diagnosis not present

## 2023-04-27 NOTE — Progress Notes (Signed)
Introduced myself to the patient, and his wife, as the prostate nurse navigator.  No barriers to care identified at this time.  He is here to discuss his radiation treatment options, and has decided to proceed with brachytherapy.  I gave him my business card and asked him to call me with questions or concerns.  Verbalized understanding.

## 2023-04-27 NOTE — Progress Notes (Signed)
Radiation Oncology         ([phone]) [phone] ________________________________  Initial Outpatient Consultation  Name: [name] MRN: [id number]  Date: 04/27/2023  DOB: 1945/11/06  WU:[id number], Darlen Round, MD  Crista Elliot, MD   REFERRING PHYSICIAN: Crista Elliot, MD  DIAGNOSIS: 77 y.o. gentleman with Stage T1c adenocarcinoma of the prostate with Gleason score of 3+4, and PSA of 9.0.  No diagnosis found.  HISTORY OF PRESENT ILLNESS: [name] is a 77 y.o. gentleman who was originally diagnosed with very favorable low-volume Gleason 6 clinical T1c adenocarcinoma in August of 2011. He decided to proceed with active surveillance at that time. Most recently, he was noted to have an elevated PSA of 9.0 by his urologist, Dr. Alvester Morin, on 01/04/23.  Patient proceeded with an MRI of the prostate on 02/19/23 that revealed a PI-RADS 4 lesion in the right anterior transition zone and right anterior fibromuscular stroma at the apex. He proceeded with an MRI/ultrasound fusion guided biopsy on 03/18/23. The prostate volume measured 35.69 cc.  Out of 12 core biopsies, 16 were positive.  The maximum Gleason score was 3+4, and this was seen in 3 out of the 4 cores of the targeted lesion. No carcinoma was seen in the standard 12 core biopsies.   The patient reviewed the biopsy results with his urologist and he has kindly been referred today for discussion of potential radiation treatment options.   PREVIOUS RADIATION THERAPY: Yes   Esophagus: received 28 fractions of EBRT under the care of Dr. Dayton Scrape in 2011  PAST MEDICAL HISTORY:  Past Medical History:  Diagnosis Date   Abdominal discomfort    Dysrhythmia    occ pac   Esophageal cancer (HCC)    and prostate   GERD (gastroesophageal reflux disease)    Hoarseness    Wears hearing aid       PAST SURGICAL HISTORY: Past Surgical History:  Procedure Laterality Date   DIRECT LARYNGOSCOPY WITH RADIAESSE INJECTION  02/08/2012    Procedure: DIRECT LARYNGOSCOPY WITH RADIAESSE INJECTION;  Surgeon: [name], MD;  Location: [facility];  Service: ENT;  Laterality: Bilateral;  microdirect-laryngosocopy  with bilateral vocal cord radiesse injection   DIRECT LARYNGOSCOPY WITH RADIAESSE INJECTION  03/21/2012   Procedure: DIRECT LARYNGOSCOPY WITH RADIAESSE INJECTION;  Surgeon: [name], MD;  Location: [facility];  Service: ENT;  Laterality: N/A;  microdirect laryngoscopy with radiaesse injection left vocal cord   DIRECT LARYNGOSCOPY WITH RADIAESSE INJECTION N/A 01/23/2013   Procedure: DIRECT LARYNGOSCOPY WITH RADIESSE VOCAL CORD AUGMENTATION LARYNGOPLASTY;  Surgeon: [name], MD;  Location: [facility];  Service: ENT;  Laterality: N/A;   ESOPHAGECTOMY  10/02/2010   Dr.Burney   ESOPHAGOGASTRODUODENOSCOPY N/A 09/24/2015   Procedure: ESOPHAGOGASTRODUODENOSCOPY (EGD);  Surgeon: Dorena Cookey, MD;  Location: [facility];  Service: Endoscopy;  Laterality: N/A;   ESOPHAGOGASTRODUODENOSCOPY N/A 12/12/2015   Procedure: ESOPHAGOGASTRODUODENOSCOPY (EGD);  Surgeon: Vida Rigger, MD;  Location: [facility];  Service: Endoscopy;  Laterality: N/A;   ESOPHAGOSCOPY  03/21/2012   Procedure: ESOPHAGOSCOPY;  Surgeon: [name], MD;  Location:  SURGERY CENTER;  Service: ENT;  Laterality: N/A;   JEJUNOSTOMY  09/29/2010   Dr.Burney   LARYNGOPLASTY  2016   LEFT HEART CATH AND CORONARY ANGIOGRAPHY N/A 12/05/2020   Procedure: LEFT HEART CATH AND CORONARY ANGIOGRAPHY;  Surgeon: Lennette Bihari, MD;  Location: [facility];  Service: Cardiovascular;  Laterality: N/A;   MICROLARYNGOSCOPY W/VOCAL CORD INJECTION  08/01/2012   Procedure: MICROLARYNGOSCOPY WITH VOCAL CORD INJECTION;  Surgeon: [name], MD;  Location: [facility];  Service: ENT;  Laterality: Left;  MICRO DIRECT LARYNGOSCOPY  AND RADIESSE  INJECTION    MICROLARYNGOSCOPY W/VOCAL CORD INJECTION N/A 08/07/2013    Procedure: MICROLARYNGOSCOPY WITH RADIESSE VOCAL CORD AUGMENTATION ;  Surgeon: [name], MD;  Location: [facility];  Service: ENT;  Laterality: N/A;   MICROLARYNGOSCOPY W/VOCAL CORD INJECTION N/A 02/19/2014   Procedure: MICROLARYNGOSCOPY WITH BILATERAL RADIESSE  VOCAL CORD AUGMENTATION;  Surgeon: [name], MD;  Location: [facility];  Service: ENT;  Laterality: N/A;   POSTERIOR CERVICAL FUSION/FORAMINOTOMY N/A 06/20/2018   Procedure: POSTERIOR CERVICAL LAMINECTOMY, POSTERIOR INSTRUMENTATION AND FUSION CERVICAL THREE-FOUR, CERVICAL FOUR-FIVE;  Surgeon: Tressie Stalker, MD;  Location: [facility];  Service: Neurosurgery;  Laterality: N/A;  POSTERIOR CERVICAL LAMINECTOMY, POSTERIOR INSTRUMENTATION AND FUSION CERVICAL THREE-FOUR, CERVICAL FOUR-FIVE   PYLOROPLASTY  09/29/2010   Dr.Burney    FAMILY HISTORY:  Family History  Problem Relation Age of Onset   Heart attack Father     SOCIAL HISTORY:  Social History   Socioeconomic History   Marital status: Married    Spouse name: Not on file   Number of children: Not on file   Years of education: Not on file   Highest education level: Not on file  Occupational History   Not on file  Tobacco Use   Smoking status: Former    Current packs/day: 0.00    Types: Cigarettes    Quit date: 06/13/2009    Years since quitting: 13.8   Smokeless tobacco: Never  Substance and Sexual Activity   Alcohol use: Yes    Alcohol/week: 10.0 standard drinks of alcohol    Types: 10 Standard drinks or equivalent per week    Comment: avg 10 Ree Kida Daniels/week   Drug use: No   Sexual activity: Not on file  Other Topics Concern   Not on file  Social History Narrative   Not on file   Social Determinants of Health   Financial Resource Strain: Not on file  Food Insecurity: Not on file  Transportation Needs: Not on file  Physical Activity: Not on file  Stress: Not on file  Social Connections: Not on file  Intimate Partner  Violence: Not on file    ALLERGIES: Patient has no known allergies.  MEDICATIONS:  Current Outpatient Medications  Medication Sig Dispense Refill   ARTIFICIAL TEAR SOLUTION OP Place 1 drop into both eyes daily as needed (dry eyes).     aspirin EC 81 MG tablet Take 1 tablet (81 mg total) by mouth daily. Swallow whole. 90 tablet 3   cetirizine (ZYRTEC) 10 MG tablet Take 10 mg by mouth daily as needed for allergies.     Fe Bisgly-Succ-C-Thre-B12-FA (IRON-150 PO) Take 150 mg by mouth.     fluticasone (FLONASE) 50 MCG/ACT nasal spray Place 1 spray into both nostrils daily as needed for allergies or rhinitis.     furosemide (LASIX) 20 MG tablet Take 20 mg by mouth daily.     irbesartan (AVAPRO) 150 MG tablet Take 150 mg by mouth daily.     metoprolol tartrate (LOPRESSOR) 25 MG tablet Take 1 tablet (25 mg total) by mouth 2 (two) times daily. 180 tablet 2   Multiple Vitamin (MULTIVITAMIN WITH MINERALS) TABS tablet Take 1 tablet by mouth daily.     neomycin-bacitracin-polymyxin (NEOSPORIN) ointment Apply 1 application topically as needed for wound care.  omeprazole (PRILOSEC) 40 MG capsule Take 40 mg by mouth daily.      pramipexole (MIRAPEX) 1 MG tablet Take 4 mg by mouth at bedtime.     rosuvastatin (CRESTOR) 40 MG tablet Take 1 tablet (40 mg total) by mouth daily. 90 tablet 3   sildenafil (REVATIO) 20 MG tablet Take 40-60 mg by mouth daily as needed (erectile dysfunction).     triamcinolone cream (KENALOG) 0.1 % Apply 1 application topically daily as needed (rash).     TURMERIC-GINGER PO Take 2 tablets by mouth daily.     No current facility-administered medications for this visit.    REVIEW OF SYSTEMS:  On review of systems, the patient reports that he is doing well overall. He denies any chest pain, shortness of breath, cough, fevers, chills, night sweats, unintended weight changes. He denies any bowel disturbances, and denies abdominal pain, nausea or vomiting. He denies any new  musculoskeletal or joint aches or pains. His IPSS was 4, indicating mild urinary symptoms. His SHIM was 5, indicating he has severe erectile dysfunction. A complete review of systems is obtained and is otherwise negative.    PHYSICAL EXAM:  Wt Readings from Last 3 Encounters:  06/26/22 191 lb (86.6 kg)  01/13/22 188 lb (85.3 kg)  06/09/21 187 lb (84.8 kg)   Temp Readings from Last 3 Encounters:  01/13/22 (!) 97.5 F (36.4 C) (Temporal)  12/05/20 98 F (36.7 C) (Oral)  11/11/20 97.8 F (36.6 C) (Oral)   BP Readings from Last 3 Encounters:  06/26/22 130/80  01/13/22 124/72  06/09/21 120/60   Pulse Readings from Last 3 Encounters:  06/26/22 60  01/13/22 70  06/09/21 88    /10  In general this is a well appearing  male in no acute distress. He's alert and oriented x4 and appropriate throughout the examination. Cardiopulmonary assessment is negative for acute distress, and he exhibits normal effort.     KPS = 100  100 - Normal; no complaints; no evidence of disease. 90   - Able to carry on normal activity; minor signs or symptoms of disease. 80   - Normal activity with effort; some signs or symptoms of disease. 57   - Cares for self; unable to carry on normal activity or to do active work. 60   - Requires occasional assistance, but is able to care for most of his personal needs. 50   - Requires considerable assistance and frequent medical care. 40   - Disabled; requires special care and assistance. 30   - Severely disabled; hospital admission is indicated although death not imminent. 20   - Very sick; hospital admission necessary; active supportive treatment necessary. 10   - Moribund; fatal processes progressing rapidly. 0     - Dead  Karnofsky DA, Abelmann WH, Craver LS and [facility] [phone]) The use of the nitrogen mustards in the palliative treatment of carcinoma: with particular reference to bronchogenic carcinoma Cancer 1 634-56  LABORATORY DATA:  Lab Results   Component Value Date   WBC 6.6 12/03/2020   HGB 13.8 12/03/2020   HCT 41.6 12/03/2020   MCV 87 12/03/2020   PLT 236 12/03/2020   Lab Results  Component Value Date   NA 141 12/03/2020   K 4.8 12/03/2020   CL 100 12/03/2020   CO2 24 12/03/2020   Lab Results  Component Value Date   ALT 62 (H) 02/24/2021   AST 32 10/19/2010   ALKPHOS 246 (H) 10/19/2010   BILITOT 0.5  10/19/2010     RADIOGRAPHY: CT CHEST LCS NODULE F/U LOW DOSE WO CONTRAST  Result Date: 04/10/2023 CLINICAL DATA:  Lung cancer screening. Former smoker with 35 pack-year history. Follow-up lung nodule. EXAM: CT CHEST WITHOUT CONTRAST FOR LUNG CANCER SCREENING NODULE FOLLOW-UP TECHNIQUE: Multidetector CT imaging of the chest was performed following the standard protocol without IV contrast. RADIATION DOSE REDUCTION: This exam was performed according to the departmental dose-optimization program which includes automated exposure control, adjustment of the mA and/or kV according to patient size and/or use of iterative reconstruction technique. COMPARISON:  12/29/2022. FINDINGS: Cardiovascular: The heart size is within normal limits. No pericardial effusion. Aortic atherosclerosis with 3 vessel coronary artery calcifications. Mediastinum/Nodes: Signs of previous esophagectomy with gastric pull-through. No enlarged mediastinal or axillary lymph nodes. Thyroid gland and trachea are unremarkable. Lungs/Pleura: No pleural effusion, airspace consolidation, or pneumothorax. Mild emphysema. As noted on the previous exam there is right lower lobe peripheral interstitial reticulation with subpleural banding. Scar is noted within the periphery of the left base which is unchanged from previous exam. The previous Lung-Rads 3 nodule within the right middle lobe is less solid and smaller. Now this nodule is non-solid in appearance with a mean derived diameter of 4.9 mm. Additional small lung nodules are stable measuring up to 4.9 Mm. There are no new  or suspicious lung nodules identified. Upper Abdomen: Aortic atherosclerotic calcifications. No acute abnormality. Musculoskeletal: No chest wall mass or suspicious bone lesions identified. IMPRESSION: 1. Lung-RADS 2, benign appearance or behavior. Continue annual screening with low-dose chest CT without contrast in 12 months. 2. Coronary artery calcifications. 3. Aortic Atherosclerosis (ICD10-I70.0) and Emphysema (ICD10-J43.9). Electronically Signed   By: Signa Kell M.D.   On: 04/10/2023 06:50      IMPRESSION/PLAN: 1. 77 y.o. gentleman with Stage T1c adenocarcinoma of the prostate with Gleason score of 3+4, and PSA of 9.0. We discussed the patient's workup and outlined the nature of prostate cancer in this setting. The patient's T stage, Gleason's score, and PSA put him into the favorable intermediate risk group. Accordingly, he is eligible for a variety of potential treatment options including continuing active surveillance, brachytherapy, 5.5 weeks of external radiation, or prostatectomy. We discussed the available radiation techniques, and focused on the details and logistics of delivery. We discussed and outlined the risks, benefits, short and long-term effects associated with radiotherapy and compared and contrasted these with prostatectomy. We discussed the role of SpaceOAR gel in reducing the rectal toxicity associated with radiotherapy. He appears to have a good understanding of his disease and our treatment recommendations which are of curative intent.  He was encouraged to ask questions that were answered to his stated satisfaction.  At the conclusion of our conversation, the patient is interested in moving forward with radioactive seeds. We will share our discussion with Dr. Alvester Morin today. Our scheduling team is working on arranging the brachytherapy. Patient was given our nurse navigator's, Marisue Ivan, office number to contact if he has any questions or concerns in the meantime. We look forward to  participating in this patient's care.   We personally spent 60 minutes in this encounter including chart review, reviewing radiological studies, meeting face-to-face with the patient, entering orders and completing documentation.     Joyice Faster, PA-C    Margaretmary Dys, MD  [facility]  Radiation Oncology Direct Dial: [phone]  Fax: [phone] [email or web address]  Skype  [email or web address]

## 2023-04-28 ENCOUNTER — Other Ambulatory Visit: Payer: Self-pay | Admitting: Urology

## 2023-04-29 DIAGNOSIS — Z191 Hormone sensitive malignancy status: Secondary | ICD-10-CM | POA: Diagnosis not present

## 2023-05-04 ENCOUNTER — Telehealth: Payer: Self-pay | Admitting: *Deleted

## 2023-05-04 NOTE — Telephone Encounter (Signed)
Called patient to update, spoke with patient. 

## 2023-05-17 ENCOUNTER — Telehealth: Payer: Self-pay | Admitting: *Deleted

## 2023-05-17 NOTE — Telephone Encounter (Signed)
CALLED PATIENT TO INFORM OF PRE-SEED APPTS. FOR 10-3, AND HIS IMPLANT DATE OF 10-21, SPOKE WITH PATIENT AND HE IS AWARE OF THESE APPTS.

## 2023-05-20 ENCOUNTER — Other Ambulatory Visit: Payer: Self-pay

## 2023-05-20 MED ORDER — METOPROLOL TARTRATE 25 MG PO TABS: 25.0000 mg | ORAL_TABLET | Freq: Two times a day (BID) | ORAL | 0 refills | Status: DC

## 2023-06-15 DIAGNOSIS — L814 Other melanin hyperpigmentation: Secondary | ICD-10-CM | POA: Diagnosis not present

## 2023-06-15 DIAGNOSIS — D225 Melanocytic nevi of trunk: Secondary | ICD-10-CM | POA: Diagnosis not present

## 2023-06-15 DIAGNOSIS — L821 Other seborrheic keratosis: Secondary | ICD-10-CM | POA: Diagnosis not present

## 2023-06-22 ENCOUNTER — Telehealth: Payer: Self-pay | Admitting: *Deleted

## 2023-06-22 DIAGNOSIS — R8271 Bacteriuria: Secondary | ICD-10-CM | POA: Diagnosis not present

## 2023-06-22 NOTE — Telephone Encounter (Signed)
Called patient to remind of pre-seed appts.for 06-24-23- lvm for a return call

## 2023-06-23 NOTE — Progress Notes (Signed)
Radiation Oncology         [phone]) [phone] ________________________________  Outpatient Follow up- Pre-seed visit  Name: [name] MRN: [id number]  Date: 06/24/2023  DOB: 1946-04-24  GM:WNUU, Darlen Round, MD  Crista Elliot, MD   REFERRING PHYSICIAN: Crista Elliot, MD  DIAGNOSIS: 77 y.o. gentleman with Stage T1c adenocarcinoma of the prostate with Gleason score of 3+4, and PSA of 9.0.     ICD-10-CM   1. Malignant neoplasm of prostate [facility])  C61       HISTORY OF PRESENT ILLNESS: [name] is a 77 y.o. male with a diagnosis of prostate cancer. He was originally diagnosed with very favorable low-volume Gleason 6 clinical T1c adenocarcinoma in August of 2011. He decided to proceed with active surveillance at that time. Most recently, he was noted to have an elevated PSA of 9.0 by his urologist, Dr. Alvester Morin, on 01/04/23.  Patient proceeded with an MRI of the prostate on 02/19/23 that revealed a PI-RADS 4 lesion in the right anterior transition zone and right anterior fibromuscular stroma at the apex. He proceeded with an MRI/ultrasound fusion guided biopsy on 03/18/23. The prostate volume measured 35.69 cc.  Out of 16 core biopsies, 3 were positive.  The maximum Gleason score was 3+4, and this was seen in 3 out of the 4 cores from the MRI ROI lesion. No carcinoma was seen in the standard 12 core biopsies.   The patient reviewed the biopsy results with his urologist and was kindly referred to [place] for discussion of potential radiation treatment options. We initially met the patient on 04/27/23 and he was most interested in proceeding with brachytherapy and SpaceOAR gel placement for treatment of his disease. He is here today for his pre-procedure imaging for planning and to answer any additional questions he may have about this treatment.   PREVIOUS RADIATION THERAPY: Yes   2011: The esophagus was treated in 28 fractions of EBRT under the care of Dr. Dayton Scrape    PAST MEDICAL  HISTORY:  Past Medical History:  Diagnosis Date   Abdominal discomfort    Dysrhythmia    occ pac   Esophageal cancer (HCC)    and prostate   GERD (gastroesophageal reflux disease)    Hoarseness    Wears hearing aid       PAST SURGICAL HISTORY: Past Surgical History:  Procedure Laterality Date   DIRECT LARYNGOSCOPY WITH RADIAESSE INJECTION  02/08/2012   Procedure: DIRECT LARYNGOSCOPY WITH RADIAESSE INJECTION;  Surgeon: [name], MD;  Location: [facility];  Service: ENT;  Laterality: Bilateral;  microdirect-laryngosocopy  with bilateral vocal cord radiesse injection   DIRECT LARYNGOSCOPY WITH RADIAESSE INJECTION  03/21/2012   Procedure: DIRECT LARYNGOSCOPY WITH RADIAESSE INJECTION;  Surgeon: [name], MD;  Location: [facility];  Service: ENT;  Laterality: N/A;  microdirect laryngoscopy with radiaesse injection left vocal cord   DIRECT LARYNGOSCOPY WITH RADIAESSE INJECTION N/A 01/23/2013   Procedure: DIRECT LARYNGOSCOPY WITH RADIESSE VOCAL CORD AUGMENTATION LARYNGOPLASTY;  Surgeon: [name], MD;  Location: [facility];  Service: ENT;  Laterality: N/A;   ESOPHAGECTOMY  10/02/2010   Dr.Burney   ESOPHAGOGASTRODUODENOSCOPY N/A 09/24/2015   Procedure: ESOPHAGOGASTRODUODENOSCOPY (EGD);  Surgeon: Dorena Cookey, MD;  Location: [facility];  Service: Endoscopy;  Laterality: N/A;   ESOPHAGOGASTRODUODENOSCOPY N/A 12/12/2015   Procedure: ESOPHAGOGASTRODUODENOSCOPY (EGD);  Surgeon: Vida Rigger, MD;  Location: [facility];  Service: Endoscopy;  Laterality: N/A;   ESOPHAGOSCOPY  03/21/2012   Procedure:  ESOPHAGOSCOPY;  Surgeon: [name], MD;  Location: [facility];  Service: ENT;  Laterality: N/A;   JEJUNOSTOMY  09/29/2010   Dr.Burney   LARYNGOPLASTY  2016   LEFT HEART CATH AND CORONARY ANGIOGRAPHY N/A 12/05/2020   Procedure: LEFT HEART CATH AND CORONARY ANGIOGRAPHY;  Surgeon: Lennette Bihari, MD;  Location: [facility];  Service: Cardiovascular;  Laterality: N/A;   MICROLARYNGOSCOPY W/VOCAL CORD INJECTION  08/01/2012   Procedure: MICROLARYNGOSCOPY WITH VOCAL CORD INJECTION;  Surgeon: [name], MD;  Location: [facility];  Service: ENT;  Laterality: Left;  MICRO DIRECT LARYNGOSCOPY  AND RADIESSE  INJECTION    MICROLARYNGOSCOPY W/VOCAL CORD INJECTION N/A 08/07/2013   Procedure: MICROLARYNGOSCOPY WITH RADIESSE VOCAL CORD AUGMENTATION ;  Surgeon: [name], MD;  Location: [facility];  Service: ENT;  Laterality: N/A;   MICROLARYNGOSCOPY W/VOCAL CORD INJECTION N/A 02/19/2014   Procedure: MICROLARYNGOSCOPY WITH BILATERAL RADIESSE  VOCAL CORD AUGMENTATION;  Surgeon: [name], MD;  Location: [facility];  Service: ENT;  Laterality: N/A;   POSTERIOR CERVICAL FUSION/FORAMINOTOMY N/A 06/20/2018   Procedure: POSTERIOR CERVICAL LAMINECTOMY, POSTERIOR INSTRUMENTATION AND FUSION CERVICAL THREE-FOUR, CERVICAL FOUR-FIVE;  Surgeon: Tressie Stalker, MD;  Location: [facility];  Service: Neurosurgery;  Laterality: N/A;  POSTERIOR CERVICAL LAMINECTOMY, POSTERIOR INSTRUMENTATION AND FUSION CERVICAL THREE-FOUR, CERVICAL FOUR-FIVE   PROSTATE BIOPSY     PYLOROPLASTY  09/29/2010   Dr.Burney    FAMILY HISTORY:  Family History  Problem Relation Age of Onset   Heart attack Father     SOCIAL HISTORY:  Social History   Socioeconomic History   Marital status: Married    Spouse name: Not on file   Number of children: Not on file   Years of education: Not on file   Highest education level: Not on file  Occupational History   Not on file  Tobacco Use   Smoking status: Former    Current packs/day: 0.00    Types: Cigarettes    Quit date: 06/13/2009    Years since quitting: 14.0   Smokeless tobacco: Never  Substance and Sexual Activity   Alcohol use: Yes    Alcohol/week: 10.0 standard drinks of alcohol    Types: 10 Standard drinks or equivalent per week    Comment: avg 10  Ree Kida Daniels/week   Drug use: No   Sexual activity: Not on file  Other Topics Concern   Not on file  Social History Narrative   Not on file   Social Determinants of Health   Financial Resource Strain: Not on file  Food Insecurity: No Food Insecurity (04/27/2023)   Hunger Vital Sign    Worried About Running Out of Food in the Last Year: Never true    Ran Out of Food in the Last Year: Never true  Transportation Needs: No Transportation Needs (04/27/2023)   PRAPARE - Administrator, Civil Service (Medical): No    Lack of Transportation (Non-Medical): No  Physical Activity: Not on file  Stress: Not on file  Social Connections: Not on file  Intimate Partner Violence: Not At Risk (04/27/2023)   Humiliation, Afraid, Rape, and Kick questionnaire    Fear of Current or Ex-Partner: No    Emotionally Abused: No    Physically Abused: No    Sexually Abused: No    ALLERGIES: Cephalexin, Clonazepam, and Zolpidem  MEDICATIONS:  Current Outpatient Medications  Medication Sig Dispense Refill   ARTIFICIAL TEAR SOLUTION OP Place 1 drop into both  eyes daily as needed (dry eyes).     diclofenac Sodium (VOLTAREN) 1 % GEL 1 application to affected areas Externally once a day As needed     Fe Bisgly-Succ-C-Thre-B12-FA (IRON-150 PO) Take 150 mg by mouth.     fluticasone (FLONASE) 50 MCG/ACT nasal spray Place 1 spray into both nostrils daily as needed for allergies or rhinitis.     furosemide (LASIX) 20 MG tablet Take 20 mg by mouth daily.     irbesartan (AVAPRO) 150 MG tablet Take 150 mg by mouth daily.     metoprolol tartrate (LOPRESSOR) 25 MG tablet Take 1 tablet (25 mg total) by mouth 2 (two) times daily. 180 tablet 0   Multiple Vitamin (MULTIVITAMIN WITH MINERALS) TABS tablet Take 1 tablet by mouth daily.     neomycin-bacitracin-polymyxin (NEOSPORIN) ointment Apply 1 application topically as needed for wound care.     omeprazole (PRILOSEC) 40 MG capsule Take 40 mg by mouth daily.       rosuvastatin (CRESTOR) 40 MG tablet Take 1 tablet (40 mg total) by mouth daily. 90 tablet 3   sildenafil (REVATIO) 20 MG tablet Take 40-60 mg by mouth daily as needed (erectile dysfunction).     tizanidine (ZANAFLEX) 2 MG capsule Take 2 mg by mouth daily. Takes total 4mg  po daily per patient.     traMADol (ULTRAM) 50 MG tablet 1 tablet Orally Up to three times a day for 31 days As needed     triamcinolone cream (KENALOG) 0.1 % Apply 1 application topically daily as needed (rash).     TURMERIC-GINGER PO Take 2 tablets by mouth daily.     No current facility-administered medications for this visit.    REVIEW OF SYSTEMS:  On review of systems, the patient reports that he is doing well overall. He denies any chest pain, shortness of breath, cough, fevers, chills, night sweats, unintended weight changes. He denies any bowel disturbances, and denies abdominal pain, nausea or vomiting. He denies any new musculoskeletal or joint aches or pains. His IPSS was 4, indicating mild urinary symptoms. His SHIM was 5, indicating he has severe erectile dysfunction. A complete review of systems is obtained and is otherwise negative.    PHYSICAL EXAM:  Wt Readings from Last 3 Encounters:  04/27/23 178 lb 9.6 oz (81 kg)  06/26/22 191 lb (86.6 kg)  01/13/22 188 lb (85.3 kg)   Temp Readings from Last 3 Encounters:  04/27/23 (!) 97.2 F (36.2 C)  01/13/22 (!) 97.5 F (36.4 C) (Temporal)  12/05/20 98 F (36.7 C) (Oral)   BP Readings from Last 3 Encounters:  04/27/23 (!) 117/59  06/26/22 130/80  01/13/22 124/72   Pulse Readings from Last 3 Encounters:  04/27/23 80  06/26/22 60  01/13/22 70    /10  In general this is a well appearing Caucasian male in no acute distress. He's alert and oriented x4 and appropriate throughout the examination. Cardiopulmonary assessment is negative for acute distress, and he exhibits normal effort.     KPS = 100  100 - Normal; no complaints; no evidence of disease. 90    - Able to carry on normal activity; minor signs or symptoms of disease. 80   - Normal activity with effort; some signs or symptoms of disease. 71   - Cares for self; unable to carry on normal activity or to do active work. 60   - Requires occasional assistance, but is able to care for most of his personal needs. 50   -  Requires considerable assistance and frequent medical care. 40   - Disabled; requires special care and assistance. 30   - Severely disabled; hospital admission is indicated although death not imminent. 20   - Very sick; hospital admission necessary; active supportive treatment necessary. 10   - Moribund; fatal processes progressing rapidly. 0     - Dead  Karnofsky DA, Abelmann WH, Craver LS and Burchenal [facility] [phone]) The use of the nitrogen mustards in the palliative treatment of carcinoma: with particular reference to bronchogenic carcinoma Cancer 1 634-56  LABORATORY DATA:  Lab Results  Component Value Date   WBC 6.6 12/03/2020   HGB 13.8 12/03/2020   HCT 41.6 12/03/2020   MCV 87 12/03/2020   PLT 236 12/03/2020   Lab Results  Component Value Date   NA 141 12/03/2020   K 4.8 12/03/2020   CL 100 12/03/2020   CO2 24 12/03/2020   Lab Results  Component Value Date   ALT 62 (H) 02/24/2021   AST 32 10/19/2010   ALKPHOS 246 (H) 10/19/2010   BILITOT 0.5 10/19/2010     RADIOGRAPHY: No results found.    IMPRESSION/PLAN: 1. 77 y.o. gentleman with Stage T1c adenocarcinoma of the prostate with Gleason score of 3+4, and PSA of 9.0.  The patient has elected to proceed with seed implant for treatment of his disease. We reviewed the risks, benefits, short and long-term effects associated with brachytherapy and discussed the role of SpaceOAR in reducing the rectal toxicity associated with radiotherapy.  He appears to have a good understanding of his disease and our treatment recommendations which are of curative intent.  He was encouraged to ask questions that were answered to  his stated satisfaction. He has freely signed written consent to proceed today in the office and a copy of this document will be placed in his medical record. His procedure is tentatively scheduled for 07/12/23 in collaboration with Dr. Alvester Morin and we will see him back for his post-procedure visit approximately 3 weeks thereafter. We look forward to continuing to participate in his care. He knows that he is welcome to call with any questions or concerns at any time in the interim.  I personally spent 30 minutes in this encounter including chart review, reviewing radiological studies, meeting face-to-face with the patient, entering orders and completing documentation.    Marguarite Arbour, MMS, PA-C [facility] Radiation Oncology Physician Assistant Direct Dial: [phone]  Fax: [phone]

## 2023-06-23 NOTE — Progress Notes (Signed)
Radiation Oncology         [phone]) [phone] ________________________________  Name: [name] MRN: [id number]  Date: 06/24/2023  DOB: [birth year]  SIMULATION AND TREATMENT PLANNING NOTE PUBIC ARCH STUDY  NF:AOZH, Darlen Round, MD  Crista Elliot, MD  DIAGNOSIS: 77 y.o. gentleman with Stage T1c adenocarcinoma of the prostate with Gleason score of 3+4, and PSA of 9.0.   Oncology History  Malignant neoplasm of prostate (HCC)  03/18/2023 Cancer Staging   Staging form: Prostate, AJCC 8th Edition - Clinical stage from 03/18/2023: Stage IIB (cT1c, cN0, cM0, PSA: 9, Grade Group: 2) - Signed by Marcello Fennel, PA-C on 06/23/2023 Histopathologic type: Adenocarcinoma, NOS Stage prefix: Initial diagnosis Prostate specific antigen (PSA) range: Less than 10 Gleason primary pattern: 3 Gleason secondary pattern: 4 Gleason score: 7 Histologic grading system: 5 grade system Number of biopsy cores examined: 16 Number of biopsy cores positive: 3 Location of positive needle core biopsies: One side   04/27/2023 Initial Diagnosis   Malignant neoplasm of prostate (HCC)       ICD-10-CM   1. Malignant neoplasm of prostate (HCC)  C61       COMPLEX SIMULATION:  The patient presented today for evaluation for possible prostate seed implant. He was brought to the radiation planning suite and placed supine on the CT couch. A 3-dimensional image study set was obtained in upload to the planning computer. There, on each axial slice, I contoured the prostate gland. Then, using three-dimensional radiation planning tools I reconstructed the prostate in view of the structures from the transperineal needle pathway to assess for possible pubic arch interference. In doing so, I did not appreciate any pubic arch interference. Also, the patient's prostate volume was estimated based on the drawn structure. The volume was 36 cc.  Given the pubic arch appearance and prostate volume, patient remains a good candidate to  proceed with prostate seed implant. Today, he freely provided informed written consent to proceed.    PLAN: The patient will undergo prostate seed implant.   ________________________________  Artist Pais. Kathrynn Running, M.D.

## 2023-06-24 ENCOUNTER — Ambulatory Visit
Admission: RE | Admit: 2023-06-24 | Discharge: 2023-06-24 | Disposition: A | Discharge status: Home / Self Care | Payer: Medicare Other | Source: Ambulatory Visit | Attending: Urology | Admitting: Urology

## 2023-06-24 ENCOUNTER — Encounter: Payer: Self-pay | Admitting: Urology

## 2023-06-24 ENCOUNTER — Ambulatory Visit
Admission: RE | Admit: 2023-06-24 | Discharge: 2023-06-24 | Disposition: A | Discharge status: Home / Self Care | Payer: Medicare Other | Source: Ambulatory Visit | Attending: Radiation Oncology | Admitting: Radiation Oncology

## 2023-06-24 VITALS — Ht 72.0 in | Wt 174.0 lb

## 2023-06-24 DIAGNOSIS — C61 Malignant neoplasm of prostate: Secondary | ICD-10-CM | POA: Diagnosis present

## 2023-06-24 DIAGNOSIS — Z191 Hormone sensitive malignancy status: Secondary | ICD-10-CM | POA: Diagnosis not present

## 2023-06-24 NOTE — Progress Notes (Signed)
Pre-seed nursing interview for a diagnosis of 77 y.o. gentleman with Stage T1c adenocarcinoma of the prostate with Gleason score of 3+4, and PSA of 9.0.  Patient identity verified x2.   Patient reports doing well. Denies any urinary issues at this time.   Meaningful use complete.  Urinary Management medication(s)- None Urology appointment date- 08/09/2023, with Dr. Alvester Morin at [facility]  Ht 6' (1.829 m)   Wt 174 lb (78.9 kg)   BMI 23.60 kg/m    This concludes the interaction.  Ruel Favors, LPN

## 2023-06-30 ENCOUNTER — Other Ambulatory Visit: Payer: Self-pay

## 2023-06-30 ENCOUNTER — Telehealth: Payer: Self-pay | Admitting: Cardiology

## 2023-06-30 DIAGNOSIS — I499 Cardiac arrhythmia, unspecified: Secondary | ICD-10-CM | POA: Diagnosis not present

## 2023-06-30 DIAGNOSIS — R0689 Other abnormalities of breathing: Secondary | ICD-10-CM | POA: Diagnosis not present

## 2023-06-30 DIAGNOSIS — I469 Cardiac arrest, cause unspecified: Secondary | ICD-10-CM | POA: Diagnosis not present

## 2023-06-30 DIAGNOSIS — I4891 Unspecified atrial fibrillation: Secondary | ICD-10-CM | POA: Diagnosis not present

## 2023-06-30 MED ORDER — ROSUVASTATIN CALCIUM 40 MG PO TABS: 40.0000 mg | ORAL_TABLET | Freq: Every day | ORAL | 0 refills | Status: DC

## 2023-06-30 NOTE — Telephone Encounter (Signed)
Pre-operative Risk Assessment    Patient Name: [name]  DOB: May 26, 1946 MRN: [id number]      Request for Surgical Clearance    Procedure:   Radioactive Seed Implant   Date of Surgery:  Clearance 07/12/23                                 Surgeon:  Dr. Modena Slater  Surgeon's Group or Practice Name:  [facility]  Phone number:  [phone] Fax number:  [phone]   Type of Clearance Requested:   - Medical  - Pharmacy:  Hold Aspirin 5 days    Type of Anesthesia:  General    Additional requests/questions:      SignedEmilie Rutter   07-27-23, 4:18 PM

## 2023-06-30 NOTE — Progress Notes (Signed)
Called and left message for Pam , OR scheduler for Dr Alvester Morin,  informed her pt will need cardiac medical clearance and ASA clearance for his surgery that is scheduled for 07-12-2023 @[facility] .  Let her know that pt already has scheduled office visit with his cardiologist, Dr Anne Fu, on 07-05-2023 so she would need to do is call that office and have the clearances added to that visit.

## 2023-06-30 NOTE — Telephone Encounter (Signed)
Name: [name]  DOB: 1945-09-22  MRN: [id number]  Primary Cardiologist: Donato Schultz, MD  Chart reviewed as part of pre-operative protocol coverage. The patient has an upcoming visit scheduled with Dr. Anne Fu on 07/05/2023 at which time clearance can be addressed in case there are any issues that would impact surgical recommendations. Radioactive seed implant is not scheduled until 07/12/2023 as below. I added preop FYI to appointment note so that provider is aware to address at time of outpatient visit.  Per office protocol the cardiology provider should forward their finalized clearance decision and recommendations regarding antiplatelet therapy to the requesting party below.     I will route this message as FYI to requesting party and remove this message from the preop box as separate preop APP input not needed at this time.   Please call with any questions.  Joylene Grapes, NP  July 16, 2023, 5:08 PM

## 2023-07-05 ENCOUNTER — Ambulatory Visit: Payer: Medicare Other | Admitting: Cardiology

## 2023-07-05 ENCOUNTER — Telehealth: Payer: Self-pay | Admitting: Cardiology

## 2023-07-05 NOTE — Telephone Encounter (Signed)
Wife ([place] called to report patient passed away on 07/08/2023.

## 2023-07-05 NOTE — Telephone Encounter (Signed)
Noted - Thank you - Dr Anne Fu is aware.

## 2023-07-12 ENCOUNTER — Ambulatory Visit (HOSPITAL_BASED_OUTPATIENT_CLINIC_OR_DEPARTMENT_OTHER): Admit: 2023-07-12 | Payer: Medicare Other | Admitting: Urology

## 2023-07-12 SURGERY — INSERTION, RADIATION SOURCE, PROSTATE
Anesthesia: General

## 2023-07-23 DEATH — deceased
